# Patient Record
Sex: Male | Born: 1955 | Race: White | Hispanic: No | Marital: Married | Smoking: Former smoker
Health system: Southern US, Community
[De-identification: ages and names within clinical notes are randomized; demographics above are authoritative.]

## PROBLEM LIST (undated history)

## (undated) DIAGNOSIS — K760 Fatty (change of) liver, not elsewhere classified: Secondary | ICD-10-CM

## (undated) DIAGNOSIS — K219 Gastro-esophageal reflux disease without esophagitis: Secondary | ICD-10-CM

## (undated) DIAGNOSIS — E119 Type 2 diabetes mellitus without complications: Secondary | ICD-10-CM

## (undated) DIAGNOSIS — N189 Chronic kidney disease, unspecified: Secondary | ICD-10-CM

## (undated) DIAGNOSIS — H269 Unspecified cataract: Secondary | ICD-10-CM

## (undated) DIAGNOSIS — T7840XA Allergy, unspecified, initial encounter: Secondary | ICD-10-CM

## (undated) DIAGNOSIS — N529 Male erectile dysfunction, unspecified: Secondary | ICD-10-CM

## (undated) DIAGNOSIS — E785 Hyperlipidemia, unspecified: Secondary | ICD-10-CM

## (undated) DIAGNOSIS — K449 Diaphragmatic hernia without obstruction or gangrene: Secondary | ICD-10-CM

## (undated) DIAGNOSIS — I10 Essential (primary) hypertension: Secondary | ICD-10-CM

## (undated) DIAGNOSIS — M109 Gout, unspecified: Secondary | ICD-10-CM

## (undated) DIAGNOSIS — T884XXA Failed or difficult intubation, initial encounter: Secondary | ICD-10-CM

## (undated) DIAGNOSIS — Z87442 Personal history of urinary calculi: Secondary | ICD-10-CM

## (undated) DIAGNOSIS — C801 Malignant (primary) neoplasm, unspecified: Secondary | ICD-10-CM

## (undated) DIAGNOSIS — M5430 Sciatica, unspecified side: Secondary | ICD-10-CM

## (undated) DIAGNOSIS — M1990 Unspecified osteoarthritis, unspecified site: Secondary | ICD-10-CM

## (undated) DIAGNOSIS — E669 Obesity, unspecified: Secondary | ICD-10-CM

## (undated) HISTORY — DX: Hemochromatosis, unspecified: E83.119

## (undated) HISTORY — DX: Male erectile dysfunction, unspecified: N52.9

## (undated) HISTORY — DX: Unspecified cataract: H26.9

## (undated) HISTORY — DX: Diaphragmatic hernia without obstruction or gangrene: K44.9

## (undated) HISTORY — PX: TONSILLECTOMY: SUR1361

## (undated) HISTORY — DX: Obesity, unspecified: E66.9

## (undated) HISTORY — PX: APPENDECTOMY: SHX54

## (undated) HISTORY — DX: Other disorders of iron metabolism: E83.19

## (undated) HISTORY — DX: Gout, unspecified: M10.9

## (undated) HISTORY — DX: Type 2 diabetes mellitus without complications: E11.9

## (undated) HISTORY — DX: Hyperlipidemia, unspecified: E78.5

## (undated) HISTORY — DX: Malignant (primary) neoplasm, unspecified: C80.1

## (undated) HISTORY — DX: Chronic kidney disease, unspecified: N18.9

## (undated) HISTORY — DX: Essential (primary) hypertension: I10

## (undated) HISTORY — PX: CERVICAL FUSION: SHX112

## (undated) HISTORY — DX: Allergy, unspecified, initial encounter: T78.40XA

## (undated) HISTORY — DX: Gastro-esophageal reflux disease without esophagitis: K21.9

## (undated) HISTORY — DX: Unspecified osteoarthritis, unspecified site: M19.90

## (undated) HISTORY — PX: CARDIAC CATHETERIZATION: SHX172

## (undated) SURGERY — COLONOSCOPY WITH PROPOFOL
Anesthesia: Monitor Anesthesia Care

---

## 2002-08-21 ENCOUNTER — Emergency Department (HOSPITAL_COMMUNITY): Admission: EM | Admit: 2002-08-21 | Discharge: 2002-08-21 | Payer: Self-pay | Admitting: *Deleted

## 2002-08-21 ENCOUNTER — Encounter: Payer: Self-pay | Admitting: Emergency Medicine

## 2002-09-16 ENCOUNTER — Ambulatory Visit (HOSPITAL_COMMUNITY): Admission: RE | Admit: 2002-09-16 | Discharge: 2002-09-16 | Payer: Self-pay | Admitting: Cardiology

## 2003-01-31 ENCOUNTER — Encounter (INDEPENDENT_AMBULATORY_CARE_PROVIDER_SITE_OTHER): Payer: Self-pay | Admitting: Specialist

## 2003-01-31 ENCOUNTER — Ambulatory Visit (HOSPITAL_COMMUNITY): Admission: RE | Admit: 2003-01-31 | Discharge: 2003-01-31 | Payer: Self-pay | Admitting: *Deleted

## 2003-01-31 ENCOUNTER — Encounter (INDEPENDENT_AMBULATORY_CARE_PROVIDER_SITE_OTHER): Payer: Self-pay | Admitting: *Deleted

## 2003-04-01 ENCOUNTER — Ambulatory Visit (HOSPITAL_COMMUNITY): Admission: RE | Admit: 2003-04-01 | Discharge: 2003-04-01 | Payer: Self-pay | Admitting: *Deleted

## 2004-03-12 ENCOUNTER — Ambulatory Visit: Payer: Self-pay | Admitting: Internal Medicine

## 2004-08-30 ENCOUNTER — Ambulatory Visit: Payer: Self-pay | Admitting: Internal Medicine

## 2005-01-17 ENCOUNTER — Ambulatory Visit: Payer: Self-pay | Admitting: Internal Medicine

## 2005-02-01 ENCOUNTER — Ambulatory Visit: Payer: Self-pay | Admitting: Internal Medicine

## 2005-03-22 ENCOUNTER — Ambulatory Visit: Payer: Self-pay | Admitting: Internal Medicine

## 2005-03-29 ENCOUNTER — Ambulatory Visit: Payer: Self-pay | Admitting: Internal Medicine

## 2006-06-12 ENCOUNTER — Encounter: Payer: Self-pay | Admitting: Family Medicine

## 2006-06-12 ENCOUNTER — Ambulatory Visit: Payer: Self-pay | Admitting: Family Medicine

## 2006-06-12 DIAGNOSIS — G5600 Carpal tunnel syndrome, unspecified upper limb: Secondary | ICD-10-CM | POA: Insufficient documentation

## 2006-06-12 DIAGNOSIS — K439 Ventral hernia without obstruction or gangrene: Secondary | ICD-10-CM | POA: Insufficient documentation

## 2006-06-12 DIAGNOSIS — I10 Essential (primary) hypertension: Secondary | ICD-10-CM | POA: Insufficient documentation

## 2006-06-12 DIAGNOSIS — N529 Male erectile dysfunction, unspecified: Secondary | ICD-10-CM | POA: Insufficient documentation

## 2006-06-12 DIAGNOSIS — K279 Peptic ulcer, site unspecified, unspecified as acute or chronic, without hemorrhage or perforation: Secondary | ICD-10-CM | POA: Insufficient documentation

## 2006-06-12 DIAGNOSIS — E7800 Pure hypercholesterolemia, unspecified: Secondary | ICD-10-CM | POA: Insufficient documentation

## 2006-06-18 ENCOUNTER — Ambulatory Visit: Payer: Self-pay | Admitting: Family Medicine

## 2006-06-18 LAB — CONVERTED CEMR LAB
ALT: 82 units/L — ABNORMAL HIGH (ref 0–40)
AST: 47 units/L — ABNORMAL HIGH (ref 0–37)
Albumin: 4.2 g/dL (ref 3.5–5.2)
Alkaline Phosphatase: 54 units/L (ref 39–117)
BUN: 13 mg/dL (ref 6–23)
Bilirubin, Direct: 0.1 mg/dL (ref 0.0–0.3)
Creatinine, Ser: 0.9 mg/dL (ref 0.4–1.5)
GFR calc Af Amer: 115 mL/min
GFR calc non Af Amer: 95 mL/min
HDL: 44.3 mg/dL (ref >39.0)
Sodium: 143 meq/L (ref 135–145)
Uric Acid, Serum: 8.1 mg/dL — ABNORMAL HIGH (ref 2.4–7.0)

## 2006-06-24 ENCOUNTER — Ambulatory Visit: Payer: Self-pay | Admitting: Family Medicine

## 2006-07-03 ENCOUNTER — Telehealth: Payer: Self-pay | Admitting: Family Medicine

## 2006-07-04 ENCOUNTER — Telehealth (INDEPENDENT_AMBULATORY_CARE_PROVIDER_SITE_OTHER): Payer: Self-pay | Admitting: *Deleted

## 2006-07-04 ENCOUNTER — Ambulatory Visit: Payer: Self-pay | Admitting: Family Medicine

## 2006-07-04 DIAGNOSIS — R740 Nonspecific elevation of levels of transaminase and lactic acid dehydrogenase [LDH]: Secondary | ICD-10-CM

## 2006-07-04 DIAGNOSIS — R7401 Elevation of levels of liver transaminase levels: Secondary | ICD-10-CM | POA: Insufficient documentation

## 2006-07-07 LAB — CONVERTED CEMR LAB
Albumin: 4.9 g/dL (ref 3.5–5.2)
Alkaline Phosphatase: 71 units/L (ref 39–117)
Bilirubin, Direct: 0.1 mg/dL (ref 0.0–0.3)
HCV Ab: NEGATIVE
Hep B C IgM: NEGATIVE
Hep B Core Total Ab: NEGATIVE
Indirect Bilirubin: 0.5 mg/dL (ref 0.0–0.9)
Saturation Ratios: 32 % (ref 20–55)
Total Bilirubin: 0.6 mg/dL (ref 0.3–1.2)

## 2006-07-08 ENCOUNTER — Ambulatory Visit: Payer: Self-pay | Admitting: Family Medicine

## 2006-07-08 DIAGNOSIS — M109 Gout, unspecified: Secondary | ICD-10-CM | POA: Insufficient documentation

## 2006-07-08 DIAGNOSIS — K449 Diaphragmatic hernia without obstruction or gangrene: Secondary | ICD-10-CM | POA: Insufficient documentation

## 2006-07-08 DIAGNOSIS — M25559 Pain in unspecified hip: Secondary | ICD-10-CM | POA: Insufficient documentation

## 2006-07-14 ENCOUNTER — Encounter: Admission: RE | Admit: 2006-07-14 | Discharge: 2006-07-14 | Payer: Self-pay | Admitting: Family Medicine

## 2006-07-14 ENCOUNTER — Telehealth: Payer: Self-pay | Admitting: Family Medicine

## 2006-08-04 ENCOUNTER — Ambulatory Visit: Payer: Self-pay | Admitting: Gastroenterology

## 2006-08-04 LAB — CONVERTED CEMR LAB
Anti Nuclear Antibody(ANA): NEGATIVE
Basophils Absolute: 0.1 10*3/uL (ref 0.0–0.1)
Eosinophils Absolute: 0.2 10*3/uL (ref 0.0–0.6)
Eosinophils Relative: 3.7 % (ref 0.0–5.0)
MCV: 89.3 fL (ref 78.0–100.0)
Platelets: 191 10*3/uL (ref 150–400)
RBC: 4.83 M/uL (ref 4.22–5.81)
Saturation Ratios: 44.2 % (ref 20.0–50.0)
TSH: 1.74 microintl units/mL (ref 0.35–5.50)
Transferrin: 265 mg/dL (ref >=212.0)
WBC: 6 10*3/uL (ref 4.5–10.5)

## 2006-08-18 ENCOUNTER — Ambulatory Visit: Payer: Self-pay | Admitting: Family Medicine

## 2006-08-18 ENCOUNTER — Ambulatory Visit: Payer: Self-pay | Admitting: Gastroenterology

## 2006-08-18 LAB — CONVERTED CEMR LAB: Ferritin: 443.9 ng/mL — ABNORMAL HIGH (ref 22.0–322.0)

## 2006-08-22 LAB — CONVERTED CEMR LAB: Uric Acid, Serum: 7.9 mg/dL — ABNORMAL HIGH (ref 2.4–7.0)

## 2006-08-25 ENCOUNTER — Ambulatory Visit: Payer: Self-pay | Admitting: Gastroenterology

## 2007-07-01 ENCOUNTER — Telehealth: Payer: Self-pay | Admitting: Gastroenterology

## 2007-07-06 ENCOUNTER — Ambulatory Visit: Payer: Self-pay | Admitting: Gastroenterology

## 2007-07-06 LAB — CONVERTED CEMR LAB
Bilirubin, Direct: 0.1 mg/dL (ref 0.0–0.3)
TSH: 1.34 microintl units/mL (ref 0.35–5.50)
Total Bilirubin: 0.8 mg/dL (ref 0.3–1.2)

## 2007-07-08 ENCOUNTER — Telehealth: Payer: Self-pay | Admitting: Gastroenterology

## 2007-08-08 ENCOUNTER — Encounter (INDEPENDENT_AMBULATORY_CARE_PROVIDER_SITE_OTHER): Payer: Self-pay | Admitting: *Deleted

## 2007-08-11 ENCOUNTER — Ambulatory Visit: Payer: Self-pay | Admitting: Gastroenterology

## 2007-10-01 ENCOUNTER — Ambulatory Visit: Payer: Self-pay | Admitting: Family Medicine

## 2007-10-13 ENCOUNTER — Telehealth: Payer: Self-pay | Admitting: Family Medicine

## 2007-10-14 ENCOUNTER — Ambulatory Visit: Payer: Self-pay | Admitting: Family Medicine

## 2007-10-23 ENCOUNTER — Ambulatory Visit: Payer: Self-pay | Admitting: Family Medicine

## 2008-03-06 ENCOUNTER — Telehealth: Payer: Self-pay | Admitting: Internal Medicine

## 2008-03-07 ENCOUNTER — Ambulatory Visit: Payer: Self-pay | Admitting: Family Medicine

## 2008-09-23 ENCOUNTER — Ambulatory Visit: Payer: Self-pay | Admitting: Gastroenterology

## 2008-10-07 ENCOUNTER — Ambulatory Visit: Payer: Self-pay | Admitting: Gastroenterology

## 2008-10-07 LAB — CONVERTED CEMR LAB
Albumin: 4.5 g/dL (ref 3.5–5.2)
BUN: 14 mg/dL (ref 6–23)
Cholesterol: 249 mg/dL (ref 0–200)
Creatinine, Ser: 1.1 mg/dL (ref 0.4–1.5)
Direct LDL: 186.4 mg/dL
GFR calc Af Amer: 91 mL/min
GFR calc non Af Amer: 75 mL/min
HDL: 43.2 mg/dL (ref >39.0)
Total Protein: 7 g/dL (ref 6.0–8.3)
Triglycerides: 119 mg/dL (ref 0–149)
VLDL: 24 mg/dL (ref 0–40)

## 2008-10-10 ENCOUNTER — Telehealth: Payer: Self-pay | Admitting: Gastroenterology

## 2008-10-10 LAB — CONVERTED CEMR LAB
Iron: 178 ug/dL — ABNORMAL HIGH (ref 42–165)
Transferrin: 257.2 mg/dL (ref 212.0–360.0)

## 2008-10-11 ENCOUNTER — Telehealth: Payer: Self-pay | Admitting: Gastroenterology

## 2008-10-11 ENCOUNTER — Telehealth: Payer: Self-pay | Admitting: Family Medicine

## 2008-10-12 ENCOUNTER — Ambulatory Visit: Payer: Self-pay | Admitting: Gastroenterology

## 2008-10-12 ENCOUNTER — Ambulatory Visit: Payer: Self-pay | Admitting: Family Medicine

## 2008-10-18 LAB — CONVERTED CEMR LAB
ALT: 33 units/L (ref 0–53)
AST: 27 units/L (ref 0–37)
HDL: 43.5 mg/dL (ref >39.00)
Hemoglobin: 15.1 g/dL (ref 13.0–17.0)
Triglycerides: 164 mg/dL — ABNORMAL HIGH (ref 0.0–149.0)
VLDL: 32.8 mg/dL (ref 0.0–40.0)

## 2008-11-08 ENCOUNTER — Telehealth: Payer: Self-pay | Admitting: Family Medicine

## 2008-11-16 ENCOUNTER — Telehealth: Payer: Self-pay | Admitting: Gastroenterology

## 2008-12-06 ENCOUNTER — Ambulatory Visit: Payer: Self-pay | Admitting: Gastroenterology

## 2008-12-06 DIAGNOSIS — E669 Obesity, unspecified: Secondary | ICD-10-CM | POA: Insufficient documentation

## 2008-12-06 DIAGNOSIS — R1011 Right upper quadrant pain: Secondary | ICD-10-CM | POA: Insufficient documentation

## 2008-12-06 DIAGNOSIS — K7689 Other specified diseases of liver: Secondary | ICD-10-CM | POA: Insufficient documentation

## 2008-12-06 LAB — CONVERTED CEMR LAB: Hemoglobin: 13.8 g/dL (ref 13.0–17.0)

## 2008-12-09 ENCOUNTER — Ambulatory Visit (HOSPITAL_COMMUNITY): Admission: RE | Admit: 2008-12-09 | Discharge: 2008-12-09 | Payer: Self-pay | Admitting: Gastroenterology

## 2008-12-09 ENCOUNTER — Ambulatory Visit: Payer: Self-pay | Admitting: Gastroenterology

## 2008-12-09 DIAGNOSIS — K2970 Gastritis, unspecified, without bleeding: Secondary | ICD-10-CM | POA: Insufficient documentation

## 2008-12-09 DIAGNOSIS — K2990 Gastroduodenitis, unspecified, without bleeding: Secondary | ICD-10-CM

## 2009-01-10 ENCOUNTER — Telehealth: Payer: Self-pay | Admitting: Family Medicine

## 2009-01-27 ENCOUNTER — Ambulatory Visit: Payer: Self-pay | Admitting: Gastroenterology

## 2009-01-27 LAB — CONVERTED CEMR LAB
HCT: 41.4 % (ref 39.0–52.0)
Hemoglobin: 14.3 g/dL (ref 13.0–17.0)

## 2009-07-18 ENCOUNTER — Telehealth: Payer: Self-pay | Admitting: Gastroenterology

## 2009-07-26 ENCOUNTER — Ambulatory Visit: Payer: Self-pay | Admitting: Gastroenterology

## 2009-07-27 ENCOUNTER — Telehealth: Payer: Self-pay | Admitting: Gastroenterology

## 2009-07-27 LAB — CONVERTED CEMR LAB
Alkaline Phosphatase: 55 units/L (ref 39–117)
Basophils Absolute: 0.1 10*3/uL (ref 0.0–0.1)
Bilirubin, Direct: 0.1 mg/dL (ref 0.0–0.3)
Ferritin: 64.3 ng/mL (ref 22.0–322.0)
Hemoglobin: 14.8 g/dL (ref 13.0–17.0)
Lymphocytes Relative: 26.4 % (ref 12.0–46.0)
Monocytes Relative: 8.7 % (ref 3.0–12.0)
Neutro Abs: 5 10*3/uL (ref 1.4–7.7)
Platelets: 210 10*3/uL (ref 150.0–400.0)
RDW: 13.9 % (ref 11.5–14.6)
WBC: 8.2 10*3/uL (ref 4.5–10.5)

## 2009-08-22 ENCOUNTER — Encounter: Admission: RE | Admit: 2009-08-22 | Discharge: 2009-08-22 | Payer: Self-pay | Admitting: Gastroenterology

## 2009-10-12 ENCOUNTER — Emergency Department (HOSPITAL_COMMUNITY): Admission: EM | Admit: 2009-10-12 | Discharge: 2009-10-12 | Payer: Self-pay | Admitting: Emergency Medicine

## 2009-10-16 ENCOUNTER — Encounter: Admission: RE | Admit: 2009-10-16 | Discharge: 2009-10-16 | Payer: Self-pay | Admitting: Occupational Medicine

## 2009-12-25 ENCOUNTER — Telehealth: Payer: Self-pay | Admitting: Family Medicine

## 2009-12-29 ENCOUNTER — Encounter (INDEPENDENT_AMBULATORY_CARE_PROVIDER_SITE_OTHER): Payer: Self-pay | Admitting: *Deleted

## 2010-01-22 ENCOUNTER — Ambulatory Visit: Payer: Self-pay | Admitting: Gastroenterology

## 2010-01-23 ENCOUNTER — Telehealth (INDEPENDENT_AMBULATORY_CARE_PROVIDER_SITE_OTHER): Payer: Self-pay | Admitting: *Deleted

## 2010-01-29 ENCOUNTER — Encounter: Payer: Self-pay | Admitting: Family Medicine

## 2010-01-29 ENCOUNTER — Ambulatory Visit: Payer: Self-pay | Admitting: Family Medicine

## 2010-01-29 DIAGNOSIS — M109 Gout, unspecified: Secondary | ICD-10-CM | POA: Insufficient documentation

## 2010-01-30 ENCOUNTER — Ambulatory Visit: Payer: Self-pay | Admitting: Family Medicine

## 2010-01-30 ENCOUNTER — Encounter (INDEPENDENT_AMBULATORY_CARE_PROVIDER_SITE_OTHER): Payer: Self-pay | Admitting: *Deleted

## 2010-01-30 ENCOUNTER — Telehealth (INDEPENDENT_AMBULATORY_CARE_PROVIDER_SITE_OTHER): Payer: Self-pay | Admitting: *Deleted

## 2010-01-30 LAB — CONVERTED CEMR LAB: HDL goal, serum: 40 mg/dL

## 2010-02-08 ENCOUNTER — Ambulatory Visit: Payer: Self-pay | Admitting: Family Medicine

## 2010-02-14 ENCOUNTER — Telehealth (INDEPENDENT_AMBULATORY_CARE_PROVIDER_SITE_OTHER): Payer: Self-pay | Admitting: *Deleted

## 2010-04-03 NOTE — Letter (Signed)
Summary: Appointment Reminder  [facility]  [address] [place], [place] [address]   Phone: [phone]  Fax: [phone]        January 30, 2010 MRN: [id number]    [facility] [address] [place], [place]  [address]    Dear Mr. Fineberg,   We have been unable to reach you by phone to schedule a follow up   appointment that was recommended for you by Dr. Jarold Motto. It is very   important that we reach you to schedule the lab appointment. We hope   that you allow [place] to participate in your health care needs. Please   contact us at  [phone] at your earliest convenience to schedule your appointment.     Sincerely,    Harlow Mares CMA (AAMA)

## 2010-04-03 NOTE — Letter (Signed)
Summary: Generic Letter  [facility]  [address] [place], [place] [address]   Phone: [phone]  Fax: [phone]    12/29/2009     [facility] [address] [place], [place]  [address]    Dear Mr. Yohannes,   We have tried to contact you because you need a Physical appt. I am going to refill medication for 2 months but, if no physical appt by then there will be no furthur refills.        Sincerely,  Kerby Nora MD

## 2010-04-03 NOTE — Progress Notes (Signed)
Summary: wants lab work  Phone Note Call from Patient Call back at [employer] [phone]   Caller: Spouse Summary of Call: Pt is coming in on 11/29 for a physical.  He wants lab work prior.  Wants B12, PSA, ferritin, lipids, vit D. Initial call taken by: Lowella Petties CMA, AAMA,  January 23, 2010 8:05 AM  Follow-up for Phone Call         B12, PSA, ferritin, lipids, vit D, CMET , uric acid  Dx 272.0, 780.79, v76.44, 274.0, 275.0 Follow-up by: Kerby Nora MD,  January 24, 2010 12:25 AM  Additional Follow-up for Phone Call Additional follow up Details #1::        lmom for pt to call and schedule lab appt. Additional Follow-up by: Mills Koller,  January 24, 2010 10:20 AM    Additional Follow-up for Phone Call Additional follow up Details #2::    spoke with wife and scheduled appt, she also wanted to know if we could check his homocystine level, and a MMA. Natasha Chavers CMA Duncan Dull)  January 24, 2010 12:54 PM   Additional Follow-up for Phone Call Additional follow up Details #3:: Details for Additional Follow-up Action Taken: Yes we can add these  Dx 268.9 Additional Follow-up by: Kerby Nora MD,  January 25, 2010 10:36 PM

## 2010-04-03 NOTE — Progress Notes (Signed)
Summary: Schedule Phlenotomy   ---- Converted from flag ---- ---- 01/24/2010 8:28 AM, Harlow Mares CMA (AAMA) wrote: Left a message on patients machine to call back.   ---- 01/15/2010 11:54 AM, Harlow Mares CMA (AAMA) wrote: called and let pt know on Vm that he is due for his phlebotomy...check to see if he had it done.   ---- 11/01/2009 4:34 PM, Harlow Mares CMA (AAMA) wrote: left meaage on machine to call back   ---- 07/27/2009 11:52 AM, Harlow Mares CMA (AAMA) wrote: pt due for phlebotomy, please call and put order in IDX ------------------------------  LETTER MAILED

## 2010-04-03 NOTE — Progress Notes (Signed)
Summary: benicar  Phone Note Refill Request Message from:  [employer] on December 25, 2009 7:42 AM  Refills Requested: Medication #1:  BENICAR HCT 40-25 MG TABS Take 1 tablet by mouth once a day   Supply Requested: 3 months Patient not seen in office in over a year  [facility] [place]   Method Requested: Electronic Initial call taken by: Benny Lennert CMA Duncan Dull),  December 25, 2009 7:42 AM  Follow-up for Phone Call        Needs appt  for CPX before refill..may refill until appt day.  Follow-up by: Kerby Nora MD,  December 26, 2009 8:16 AM  Additional Follow-up for Phone Call Additional follow up Details #1::        left 2 messages for paitent to return my call.Consuello Masse CMA     Additional Follow-up by: Benny Lennert CMA Duncan Dull),  December 27, 2009 3:21 PM    Additional Follow-up for Phone Call Additional follow up Details #2::    Left message at [phone] for patient to call back. No answer or v/m at home phone.Lewanda Rife LPN  December 28, 2009 4:33 PM   UNABLE TO CONTACT PATIENT SO LETTER MAILED.Consuello Masse CMA  December 29, 2009 3:22 PM  Follow-up by: Benny Lennert CMA Duncan Dull),  December 29, 2009 3:22 PM

## 2010-04-03 NOTE — Progress Notes (Signed)
Summary: labwork   Phone Note Call from Patient Call back at Home Phone [phone]   Caller: wife, Eber Jones Call For: Dr. Jarold Motto Reason for Call: Talk to Nurse Summary of Call: would like to sch labwork for a check-up Initial call taken by: Vallarie Mare,  Jul 18, 2009 11:04 AM  Follow-up for Phone Call        What lab is needed? Follow-up by: Ashok Cordia RN,  Jul 18, 2009 11:46 AM  Additional Follow-up for Phone Call Additional follow up Details #1::        cbc and ferritin Additional Follow-up by: Mardella Layman MD Clementeen Graham,  Jul 18, 2009 12:03 PM    Additional Follow-up for Phone Call Additional follow up Details #2::    Wife asks if liver needs to be checked?   Will come by 07/26/09. Follow-up by: Ashok Cordia RN,  Jul 18, 2009 12:23 PM  Additional Follow-up for Phone Call Additional follow up Details #3:: Details for Additional Follow-up Action Taken: yes Additional Follow-up by: Mardella Layman MD [facility],  Jul 18, 2009 1:38 PM

## 2010-04-03 NOTE — Progress Notes (Signed)
Summary: follow up from labs and Abdominal pain   Phone Note Call from Patient Call back at Home Phone [phone]   Caller: Spouse Summary of Call: patients wife called back to find out what the patients ferritin was at his last labs and to let Dr. Jarold Motto know that the patient is still having RUQ pain that is more constant and a little more severe than previous than at his last office visit. She would like Jah to have a MRI to check and see what is going on if possible she would prefer MRI to a CT due to the amount of radiation from a CT.  Initial call taken by: Harlow Mares CMA Duncan Dull),  Jul 27, 2009 1:48 PM  Follow-up for Phone Call        MRI PROBABLY INDICATED HERE. Follow-up by: Mardella Layman MD Clementeen Graham,  Jul 28, 2009 8:30 AM  Additional Follow-up for Phone Call Additional follow up Details #1::        Lm for pt to call.  Ashok Cordia RN  August 02, 2009 11:45 AM   Talked with wife.  Req MRi to be done at [facility].  Ptis off of work on June 21 if possible. Additional Follow-up by: Ashok Cordia RN,  August 03, 2009 2:48 PM    Additional Follow-up for Phone Call Additional follow up Details #2::    Wife notified of appt.  Pt needs to be NPO hrs prior to proc.   Follow-up by: Ashok Cordia RN,  August 03, 2009 2:55 PM

## 2010-04-05 NOTE — Progress Notes (Signed)
Summary: exposed to pin worms  Phone Note Call from Patient Call back at Home Phone [phone]   Caller: Spouse Complaint: Earache/Ear Infection Summary of Call: Pt's granddaughter has been sleeping with pt and his wife and they have found out that granddaughter has pin worms.  [facility] [place]. Initial call taken by: Lowella Petties CMA, AAMA,  February 14, 2010 4:48 PM  Follow-up for Phone Call        albendazole 200 mg, 2 tabs by mouth now. Repeat a second dose of 2 tabs 2 weeks from initial dose. #4.  Vermox no longer made.  Entire family should be treated. Spencer Copland MD  February 14, 2010 6:00 PM  Follow-up by: Hannah Beat MD,  February 14, 2010 6:00 PM    New/Updated Medications: ALBENZA 200 MG TABS (ALBENDAZOLE) take 2 tablets today and then 2 tablets in 2 weeks Prescriptions: ALBENZA 200 MG TABS (ALBENDAZOLE) take 2 tablets today and then 2 tablets in 2 weeks  #4 x 0   Entered by:   Benny Lennert CMA (AAMA)   Authorized by:   Hannah Beat MD   Signed by:   Benny Lennert CMA (AAMA) on 02/15/2010   Method used:   Electronically to        [facility] [phone][facility] (retail)       [address]       [place], [place]  [address]       Ph: [phone] or [phone]       Fax: [phone]   RxID:   [id number]   Prior Medications: BENICAR HCT 40-25 MG TABS (OLMESARTAN MEDOXOMIL-HCTZ) Take 1 tablet by mouth once a day CIALIS 20 MG TABS (TADALAFIL) 1/4 -1 tab by mouth as needed prior to sexual activity. Current Allergies: ! PENICILLIN V POTASSIUM (PENICILLIN V POTASSIUM) ! KEFLEX

## 2010-04-05 NOTE — Assessment & Plan Note (Signed)
Summary: CPX   Vital Signs:  Patient profile:   55 year old male Height:      66.25 inches Weight:      227.8 pounds BMI:     36.62 Temp:     98.8 degrees F oral Pulse rate:   68 / minute Pulse rhythm:   regular BP sitting:   120 / 78  (left arm) Cuff size:   large  Vitals Entered By: Benny Lennert CMA Duncan Dull) (January 30, 2010 12:04 PM)  History of Present Illness: Chief complaint cpx  The patient is here for annual wellness exam and preventative care.    GERd, well controlled, not requiring  pantoprazole, ran out several months ago. Marland Kitchen  Has had URI symptoms,x 1-2 week.. congestion improving but cough not resolving...feels phelgm in back of throat. Keep shim up at night. NO SOB, no wheeze. No fever. Using robitussin DM.  Out of work in last 4 months Bulging disc in neck...on prednisone..Dr. Alto Denver. [facility].  Currently getting PT..has follow up on 12/9.  Since took prednisone.. noted breast soreness.. has gained 10 lbs on prednisone.  Gradually improving breast soreness.   ED.Marland Kitchennoted worsening in last 4 month... No change in desire.. trouble maintaining erection.   Hypertension History:      He denies headache, chest pain, palpitations, dyspnea with exertion, peripheral edema, neurologic problems, and syncope.  Some Bps at home 122/64.        Positive major cardiovascular risk factors include male age 80 years old or older, hyperlipidemia, and hypertension.  Negative major cardiovascular risk factors include non-tobacco-user status.    Lipid Management History:      Positive NCEP/ATP III risk factors include male age 21 years old or older and hypertension.  Negative NCEP/ATP III risk factors include non-tobacco-user status.     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 Take 1 Tablet By Mouth Once A Day  Allergies: 1)  ! Penicillin V Potassium (Penicillin V Potassium) 2)  ! Keflex  Past History:  Past medical, surgical, family and social histories (including risk factors) reviewed, and no changes noted (except as noted below).  Past Medical History: Reviewed history from 06/12/2006 and no changes required. Hyperlipidemia Hypertension Peptic ulcer disease from aspirin, ibuprofen  Past Surgical History: Reviewed history from 08/11/2007 and no changes required. EGD: 2000  PUD Appendectomy (1610) Tonsillectomy (1979) cardiac cath neg  ~2004 he had colonoscopy by Dr. Virginia Rochester in 2005.  Family History: Reviewed history from 08/11/2007 and no changes required. father dead age 12 lung cancer mother dead age 73 melanoma 2 brother: DM, HTN, kidney stones, lone deceased age 42L eukemia no  Mi less than age 26  Social History: Reviewed history from 06/12/2006 and no changes required. Occupation: [employer] driver Married x 30 Former Smoker: 2-3 pack year history remote Drug use-no Alcohol use-yes 1-2 beers every 2 weeks Regular exercise-noDiet: buiscut for breakfast, some fruit and veggies   Review of Systems General:  Denies fatigue and fever. CV:  Denies chest  pain or discomfort. Resp:  Denies shortness of breath. GI:  Denies abdominal pain. GU:  Denies dysuria.  Physical Exam  General:  Overweight  no acute distress.healthy appearing.  I cannot appreciate stigmata of chronic liver disease. Eyes:  No corneal or conjunctival inflammation noted. EOMI. Perrla. Funduscopic exam benign, without hemorrhages, exudates or papilledema. Vision grossly normal. Ears:  External ear exam shows no significant lesions or deformities.  Otoscopic examination reveals clear canals, tympanic membranes are intact bilaterally without bulging, retraction, inflammation or discharge. Hearing is grossly normal bilaterally. Nose:  External nasal examination shows no deformity or inflammation. Nasal mucosa are pink and moist without lesions or exudates. Mouth:  Oral mucosa and oropharynx without lesions or exudates.  Teeth in good repair. Neck:  no carotid bruit or thyromegaly no cervical or supraclavicular lymphadenopathy  Lungs:  Normal respiratory effort, chest expands symmetrically. Lungs are clear to auscultation, no crackles or wheezes. Heart:  Normal rate and regular rhythm. S1 and S2 normal without gallop, murmur, click, rub or other extra sounds. Abdomen:  He is obese and has hepatomegaly in the right upper quadrant with a somewhat lobular and firm and somewhat tender liver 5-6 cm below the right subcostal margin. Cannot appreciate splenomegaly, ascites, other abdominal masses or tenderness. Genitalia:  NO hypogonadism, no masses Prostate:  Prostate gland firm and smooth, no enlargement, nodularity, tenderness, mass, asymmetry or induration. Pulses:  R and L posterior tibial pulses are full and equal bilaterally  Extremities:  No clubbing, cyanosis, edema or deformities noted. Skin:  Intact without suspicious lesions or rashes Psych:  Alert and cooperative. Normal mood and affect.   Impression & Recommendations:  Problem # 1:  Preventive Health Care (ICD-V70.0) The  patient's preventative maintenance and recommended screening tests for an annual wellness exam were reviewed in full today. Brought up to date unless services declined.  Counselled on the importance of diet, exercise, and its role in overall health and mortality. The patient's FH and SH was reviewed, including their home life, tobacco status, and drug and alcohol status.   PSA not back yet.. Sent to outside lab.     Problem # 2:  HYPERTENSION (ICD-401.9) Assessment: Unchanged Well controlled.Call if Blood pressure continues to lower... less than 110s/60s...to consider decreasing benicar. His updated medication list for this problem includes:    Benicar Hct 40-25 Mg Tabs (Olmesartan medoxomil-hctz) .Marland Kitchen... Take 1 tablet by mouth once a day  BP today: 120/78 Prior BP: 110/78 (12/06/2008)  Prior 10 Yr Risk Heart Disease: Not enough information (06/12/2006)  Labs Reviewed: K+: 3.4 (10/14/2007) Creat: : 1.1 (10/14/2007)   Chol: 222 (10/12/2008)   HDL: 43.50 (10/12/2008)   LDL: DEL (10/14/2007)   TG: 164.0 (10/12/2008)  Problem # 3:  HYPERLIPIDEMIA (ICD-272.4) LAb results not back yet... sent to outside lab.  Labs Reviewed: SGOT: 50 (07/26/2009)   SGPT: 77 (07/26/2009)  Prior 10 Yr Risk Heart Disease: Not enough information (06/12/2006)   HDL:43.50 (10/12/2008), 43.2 (10/14/2007)  LDL:DEL (10/14/2007), DEL (06/18/2006)  Chol:222 (10/12/2008), 249 (10/14/2007)  Trig:164.0 (10/12/2008), 119 (10/14/2007)  Problem # 4:  HIATAL HERNIA WITH REFLUX (ICD-553.3) Assessment: Improved No longer  requiring PPI. Asymptomatic off this med.   Problem # 5:  ERECTILE DYSFUNCTION, ORGANIC (ICD-607.84) Cialis trial. No decrease in desire... if not improving with cialis.. can add testosterone to labs.  His updated medication list for this problem includes:    Cialis 20 Mg Tabs (Tadalafil) .Marland Kitchen... 1/4 -1 tab by mouth as needed prior to sexual activity.  Problem # 6:  URI (ICD-465.9)  The following  medications were removed from the medication list:    Zyrtec Allergy 10 Mg Tabs (Cetirizine hcl) .Marland Kitchen... Take 1 tablet by mouth once a day His updated medication list for this problem includes:    Cheratussin Ac 100-10 Mg/6ml Syrp (Guaifenesin-codeine) .Marland Kitchen... 1 tsp by mouth at bedtime as needed cough  Instructed on symptomatic treatment. Call if symptoms persist or worsen.   Problem # 7:  IRON OVERLOAD (ICD-275.0) Ferritin and LFT levels pending.   Complete Medication List: 1)  Benicar Hct 40-25 Mg Tabs (Olmesartan medoxomil-hctz) .... Take 1 tablet by mouth once a day 2)  Cialis 20 Mg Tabs (Tadalafil) .... 1/4 -1 tab by mouth as needed prior to sexual activity. 3)  Cheratussin Ac 100-10 Mg/56ml Syrp (Guaifenesin-codeine) .Marland Kitchen.. 1 tsp by mouth at bedtime as needed cough  Hypertension Assessment/Plan:      The patient's hypertensive risk group is category B: At least one risk factor (excluding diabetes) with no target organ damage.  Today's blood pressure is 120/78.  His blood pressure goal is < 140/90.  Lipid Assessment/Plan:      Based on NCEP/ATP III, the patient's risk factor category is "2 or more risk factors and a calculated 10 year CAD risk of > 20%".  The patient's lipid goals are as follows: Total cholesterol goal is 200; LDL cholesterol goal is 130; HDL cholesterol goal is 40; Triglyceride goal is 150.     Patient Instructions: 1)  Call if Blood pressure continues to lower... less than 110s/60s...to consider decreasing benicar. 2)   Guafenesin during the day. 3)   Cough suppressant at night.  4)  We will call you with your lab results when they return.   FPrescriptions: CHERATUSSIN AC 100-10 MG/5ML SYRP (GUAIFENESIN-CODEINE) 1 tsp by mouth at bedtime as needed cough  #8 oz x 0   Entered and Authorized by:   Kerby Nora MD   Signed by:   Kerby Nora MD on 01/30/2010   Method used:   Print then Give to Patient   RxID:   [id number] CHERATUSSIN AC 100-10 MG/5ML SYRP  (GUAIFENESIN-CODEINE) 1 tsp by mouth at bedtime as needed cough  #8 oz x 0   Entered and Authorized by:   Kerby Nora MD   Signed by:   Kerby Nora MD on 01/30/2010   Method used:   Print then Give to Patient   RxID:   [id number] CIALIS 20 MG TABS (TADALAFIL) 1/4 -1 tab by mouth as needed prior to sexual activity.  #9 x 0   Entered and Authorized by:   Kerby Nora MD   Signed by:   Kerby Nora MD on 01/30/2010   Method used:   Electronically to        [facility] (retail)       [address], [address]       [place], [place]  [address]       Ph: [phone]       Fax: [phone]   RxID:   [id number]  Orders Added: 1)  Est. Patient 40-64 years [[address]]    Current Allergies (reviewed today): ! PENICILLIN V POTASSIUM (PENICILLIN V POTASSIUM) ! KEFLEX  Flu Vaccine Next Due:  Refused

## 2010-07-17 NOTE — Assessment & Plan Note (Signed)
[facility]                         GASTROENTEROLOGY OFFICE NOTE   DJ, SENTENO                         MRN:          [id number]  DATE:08/04/2006                            DOB:          05/14/1955    CHIEF COMPLAINT:  Mr. Messmer is a 55 year old, white male referred to  GI for evaluation of abnormal liver function tests, abdominal pain, and  diffuse arthralgias.   HISTORY OF PRESENT ILLNESS:  Mr. Orihuela has had polyarticular arthralgias  for several years and has used a large amount of NSAIDs. He apparently  had a guaiac-positive stool on Hemoccult exam in January 2005 and was  referred to Dr. Sabino Gasser who did a colonoscopy on April 01, 2003.  This was entirely normal. At that time, he apparently also had an  endoscopy and was told that he had ulcers, and apparently was placed  on Protonix. He has had no further GI problems until the last 6-8 weeks  when he has had some dull discomfort in his upper abdomen with gas and  bloating. He has noticed the development of a ventral hernia in his  upper abdomen. He had rather marked indigestion and dyspepsia which has  been alleviated by taking Protonix which he had left over from his  previous problems. He additionally has polyarticular arthralgias and saw  Dr. Ermalene Searing and had blood work done that showed an elevated SGOT of 47,  SGPT of 83 with otherwise normal metabolic profile except for a blood  sugar of 115 mg percent. He was sent for a CT scan of the chest because  of a family history of lung carcinoma and this was unremarkable but did  suggest fatty infiltration of his liver. In addition he was felt to  possibly have gouty arthritis and was placed on colchicine and  allopurinol with good relief of his symptoms although he continues to  complain of some arthralgias. He suffered from psoriasis for many years  but it has not been severe. He apparently has a long history of atypical  chest pain  and in the past has had a negative cardiac catheterization a  few years ago. I do not have these reports or the previous endoscopy  report for review. The patient as mentioned above was placed on  colchicine which caused him rather severe diarrhea and he has  discontinued this. There has been severe cases of arthritis. He has  never really had swollen red hot joints. The patient has no definite  urologic symptoms but concerns he may have kidney stones. His daughter  is a Engineer, civil (consulting) and has raised a lot of these questions in the mind of the  parents.   The patient has had a 25-30 pound weight gain over the last several  years which he relates to decreased ambulation because of his  arthralgias. He describes chronic fatigue but has had no other systemic  symptoms such as fever or chills, etc. He takes no medications for his  psoriasis. He gives no known history of hepatitis or previous prolonged  viral illnesses, clay-colored stools, dark urine, or right upper  quadrant pain of a severe nature. He has not had previous gallbladder  imaging. He denies any specific food intolerances.   PAST MEDICAL HISTORY:  Remarkable for essential hypertension,  hypercholesterolemia with intolerance to statins and previous  appendectomy in 1979.   FAMILY HISTORY:  Remarkable for lung cancer in his father who died at  age 61. Father was a heavy smoker.   MEDICATIONS:  1. Colchicine 0.6 mg p.r.n.  2. Benicar 40-25 mg daily.  3. Allopurinol 100 mg a day.  4. Metamucil daily.   The patient used to use heavy doses of ibuprofen but denies use in the  last several weeks.   He denies drug allergies.   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 He does not smoke but drinks socially. He has never had problems  with alcohol or drug dependency.   REVIEW OF SYSTEMS:  Otherwise noncontributory except for chronic low  back pain for the last several years. He denies any  specific  cardiovascular, pulmonary, genitourinary, neurologic or psychiatric  difficulties.   PHYSICAL EXAMINATION:  GENERAL:  Healthy-appearing, white male in no  distress appearing his stated age.  VITAL SIGNS:  He is 5 feet 5 inches tall, weighs 221 pounds. Blood  pressure is 130/92 and pulse was 76 and regular.  I could not appreciate stigmata of chronic liver disease. There was no  severe psoriasis rashes noted. The joints were not swollen or hot. I  could not appreciate thyromegaly or lymphadenopathy.  CHEST:  Entirely clear to auscultation and percussion.  HEART:  He was in a regular rhythm without significant murmur, gallop or  rub.  ABDOMEN:  I could not appreciate hepatosplenomegaly, abdominal masses or  tenderness. Bowel sounds were normal. He did have a mild ventral hernia  without any evidence of incarceration. Bowel sounds were normal.  EXTREMITIES:  Peripheral extremities showed no swollen joints, edema or  phlebitis.  MENTAL STATUS:  Clear.  RECTAL:  Deferred.   ASSESSMENT:  1. Elevated liver function tests probably from fatty infiltration of      the liver - rule out metabolic liver disease or chronic active      hepatitis.  2. Obesity with metabolic syndrome.  3. NSAID-induced gastroduodenitis with good response to proton pump      inhibitor therapy.  4. 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  5. Family history of lung cancer in his father with negative CT scan      of the chest.  6. History of psoriasis.   RECOMMENDATIONS:  1. Metabolic and viral screening laboratory with hepatic parameters.  2. Upper abdominal ultrasound exam visualized his liver and rule out      cholelithiasis.  3. I have prescribed Protonix 40 mg q.a.m. with avoidance of NSAIDs.  4. The patient will probably need referral to dietary therapy for      management and counseling     and exercising  program.  5. Consider rheumatology referral.     Vania Rea. Jarold Motto, MD, Caleen Essex, FAGA  Electronically Signed    DRP/MedQ  DD: 08/04/2006  DT: 08/04/2006  Job #: [id number]   cc:   Kerby Nora, MD

## 2010-07-20 NOTE — Op Note (Signed)
NAMEHANZEL, PIZZO                            ACCOUNT NO.:  [id number]   MEDICAL RECORD NO.:  [id number]                   PATIENT TYPE:  AMB   LOCATION:  ENDO                                 FACILITY:  [facility]   PHYSICIAN:  Georgiana Spinner, M.D.                 DATE OF BIRTH:  September 11, 1955   DATE OF PROCEDURE:  DATE OF DISCHARGE:                                 OPERATIVE REPORT   PROCEDURE:  Colonoscopy.   INDICATIONS:  Hemoccult positivity.   ANESTHESIA:  Demerol 70 mg, Versed 7.   DESCRIPTION OF PROCEDURE:  With the patient mildly sedated and in the left  lateral decubitus position, the rectal exam was performed which was  unremarkable.  Subsequently the Olympus videoscopic colonoscope was inserted  in the rectum and passed under direct vision to the cecum, identified by  ileocecal valve and appendiceal orifice, both of which were photographed.  From this point, the colonoscope was slowly withdrawn, taking  circumferential views of the colonic mucosa stopping in the rectum which  appeared normal on direct and retroflexed view.  The endoscope was  straightened and withdrawn.  The patient's vital signs and pulse oximetry  remained stable.  The patient tolerated the procedure well without apparent  complications.   FINDINGS:  Unremarkable examination.   PLAN:  Consider repeat in 5-10 years.                                               Georgiana Spinner, M.D.    GMO/MEDQ  D:  04/01/2003  T:  04/01/2003  Job:  [id number]

## 2010-07-20 NOTE — Cardiovascular Report (Signed)
   NAMECLAUD, Francis Cross                            ACCOUNT NO.:  [id number]   MEDICAL RECORD NO.:  [id number]                   PATIENT TYPE:  OIB   LOCATION:  2899                                 FACILITY:  [facility]   PHYSICIAN:  Salvadore Farber, M.D.             DATE OF BIRTH:  02/14/1956   DATE OF PROCEDURE:  DATE OF DISCHARGE:  09/16/2002                              CARDIAC CATHETERIZATION   PROCEDURE:  Left heart catheterization, left ventriculography, coronary  angiography.   INDICATION:  Mr. Casale is a 55 year old gentleman with hypertension and  dyslipidemia who presents with atypical left arm discomfort.  He underwent  exercise tolerance testing, demonstrating preserved left ventricular  systolic function, and a partially reversible inferior defect.  He was  referred for diagnostic angiography.   PROCEDURAL TECHNIQUE:  Informed consent was obtained.  Under 1% lidocaine  local anesthesia, a #6 French sheath was placed in the right femoral artery  using the modified Seldinger technique.  Diagnostic angiography and  ventriculography were performed using JL4, JR4, and pigtail catheters.  The  patient tolerated the procedure well and was transferred to the Holding Room  in stable condition.   COMPLICATIONS:  None.   FINDINGS:  1. LV:  109/8/13.  EF 80% without regional wall motion abnormality.  2. Left main:  Angiographically normal.  3. LAD:  Moderate sized vessel giving rise to a single diagonal branch.  It     is angiographically normal.  4. Ramus intermedius:  Moderate sized vessel, which is angiographically     normal.  5. Circumflex:  Moderately sized vessel giving rise to a single obtuse     marginal.  It is angiographically normal.  6. RCA:  Large, dominant vessel, which is angiographically normal.   IMPRESSION AND PLAN:  Angiographically normal coronary arteries with normal  left ventricular size and systolic function.  Exercise test is falsely  positive.  Suggest  noncardiac etiology to his arm discomfort.  The patient  is to follow up with Dr. Debby Bud for continued primary prevention.                                                Salvadore Farber, M.D.    WED/MEDQ  D:  09/16/2002  T:  09/17/2002  Job:  [id number]  Jesse Sans. Wall, M.D.   Rosalyn Gess Norins, M.D. [facility]   cc:   Thomas C. Wall, M.D.   Rosalyn Gess Norins, M.D. [facility]

## 2010-07-20 NOTE — Op Note (Signed)
Francis Cross, Francis Cross                            ACCOUNT NO.:  [id number]   MEDICAL RECORD NO.:  [id number]                   PATIENT TYPE:  AMB   LOCATION:  ENDO                                 FACILITY:  [facility]   PHYSICIAN:  Georgiana Spinner, M.D.                 DATE OF BIRTH:  [birth year]   DATE OF PROCEDURE:  DATE OF DISCHARGE:                                 OPERATIVE REPORT   ADDENDUM:                                               Georgiana Spinner, M.D.    GMO/MEDQ  D:  04/01/2003  T:  04/01/2003  Job:  [id number]   cc:   Rosalyn Gess. Norins, M.D. [facility]   Maisie Fus C. Wall, M.D.

## 2010-07-20 NOTE — Op Note (Signed)
Francis Cross, Francis Cross                            ACCOUNT NO.:  [id number]   MEDICAL RECORD NO.:  [id number]                   PATIENT TYPE:  AMB   LOCATION:  ENDO                                 FACILITY:  [facility]   PHYSICIAN:  Georgiana Spinner, M.D.                 DATE OF BIRTH:  October 26, 1955   DATE OF PROCEDURE:  01/31/2003  DATE OF DISCHARGE:                                 OPERATIVE REPORT   PROCEDURE:  Upper endoscopy.   INDICATIONS:  Abdominal pain.   ANESTHESIA:  1. Demerol 60 mg.  2. Versed 6 mg.   The patient states abdominal pain has improved somewhat since being started  on Protonix.   DESCRIPTION OF PROCEDURE:  With patient mildly sedated in the left lateral  decubitus position, the Olympus videoscopic endoscope was inserted in the  mouth, passed under direct vision through the esophagus, which appeared  normal.  There was no evidence of Barrett's.  We entered into the stomach.  Fundus, body, antrum, duodenal bulb, and second portion of duodenum were  visualized.  From this point, the endoscope was slowly withdrawn, taking  circumferential views of the duodenal mucosa until the endoscope then pulled  back into the stomach, and the antrum was visualized, and erosions were seen  in the antrum which were photographed and subsequently biopsied.  The  endoscope was placed in retroflexion to view the stomach from below.  The  endoscope was then straightened and withdrawn, taking circumferential views  of the remaining gastric and esophageal mucosa.  The patient's vital signs  and pulse oximeter remained stable.  The patient tolerated the procedure  well without apparent complications.   FINDINGS:  Erosions of antrum, biopsied.  Await biopsy report.  The patient  will call for results and follow up with me as an outpatient.                                               Georgiana Spinner, M.D.    GMO/MEDQ  D:  01/31/2003  T:  01/31/2003  Job:  [id number]   cc:   Rosalyn Gess. Norins,  M.D. [facility]

## 2010-08-20 ENCOUNTER — Other Ambulatory Visit: Payer: Self-pay | Admitting: Family Medicine

## 2010-12-07 ENCOUNTER — Other Ambulatory Visit: Payer: Self-pay | Admitting: Family Medicine

## 2011-03-26 ENCOUNTER — Ambulatory Visit (INDEPENDENT_AMBULATORY_CARE_PROVIDER_SITE_OTHER): Payer: BC Managed Care – PPO

## 2011-03-26 DIAGNOSIS — E236 Other disorders of pituitary gland: Secondary | ICD-10-CM

## 2011-03-26 DIAGNOSIS — I10 Essential (primary) hypertension: Secondary | ICD-10-CM

## 2011-03-26 DIAGNOSIS — J40 Bronchitis, not specified as acute or chronic: Secondary | ICD-10-CM

## 2012-05-07 ENCOUNTER — Ambulatory Visit (INDEPENDENT_AMBULATORY_CARE_PROVIDER_SITE_OTHER): Payer: BC Managed Care – PPO | Admitting: Physician Assistant

## 2012-05-07 VITALS — BP 160/80 | HR 72 | Temp 98.2°F | Resp 16 | Ht 66.5 in | Wt 221.0 lb

## 2012-05-07 DIAGNOSIS — R109 Unspecified abdominal pain: Secondary | ICD-10-CM

## 2012-05-07 DIAGNOSIS — R300 Dysuria: Secondary | ICD-10-CM

## 2012-05-07 LAB — POCT URINALYSIS DIPSTICK
Glucose, UA: NEGATIVE
Leukocytes, UA: NEGATIVE
Nitrite, UA: NEGATIVE
Spec Grav, UA: 1.01
Urobilinogen, UA: 0.2

## 2012-05-07 LAB — POCT UA - MICROSCOPIC ONLY
Bacteria, U Microscopic: NEGATIVE
Casts, Ur, LPF, POC: NEGATIVE
Yeast, UA: NEGATIVE

## 2012-05-07 MED ORDER — NAPROXEN 500 MG PO TABS: 500.0000 mg | ORAL_TABLET | Freq: Two times a day (BID) | ORAL | Status: DC

## 2012-05-07 MED ORDER — TAMSULOSIN HCL 0.4 MG PO CAPS: 0.4000 mg | ORAL_CAPSULE | Freq: Every day | ORAL | Status: DC

## 2012-05-07 MED ORDER — PHENAZOPYRIDINE HCL 200 MG PO TABS: 200.0000 mg | ORAL_TABLET | Freq: Three times a day (TID) | ORAL | Status: DC | PRN

## 2012-05-07 NOTE — Patient Instructions (Addendum)
Continue to drink a lot of water.  Pick up prescriptions.  Use urine strainer.

## 2012-05-07 NOTE — Progress Notes (Signed)
  Subjective:    Patient ID: Francis Cross, male    DOB: 1955/10/22, 57 y.o.   MRN: [id number]  HPI 57 yr old CM presents with pain with urination and urethral pain on and off today.  This was followed after having some R flank pain about 2 weeks ago that has occurred intermittently and started radiating around to the front and into the groin. He had some nausea with it yesterday and that was when he really had the radiating pain. No fever.  He denies prostate type symptoms.  He denies abdominal pain.  No change in bowel habits. Pain seemed to be relieved with ibuprofen.  He didn't take his BP med today. No rash.  [employer].  Denies STD risk factors.  Review of Systems  All other systems reviewed and are negative.       Objective:   Physical Exam  Nursing note and vitals reviewed. Constitutional: He is oriented to person, place, and time. He appears well-developed and well-nourished.  HENT:  Head: Normocephalic and atraumatic.  Neck: Normal range of motion.  Cardiovascular: Normal rate, regular rhythm and normal heart sounds.   Pulmonary/Chest: Effort normal and breath sounds normal.  Abdominal: Soft. Bowel sounds are normal.  No CVA tenderness.  Neurological: He is alert and oriented to person, place, and time.  Skin: Skin is warm and dry. No rash noted.  Psychiatric: He has a normal mood and affect. His behavior is normal.     Results for orders placed in visit on 05/07/12  POCT UA - MICROSCOPIC ONLY      Result Value Range   WBC, Ur, HPF, POC 0-1     RBC, urine, microscopic neg     Bacteria, U Microscopic neg     Mucus, UA neg     Epithelial cells, urine per micros 0-2     Crystals, Ur, HPF, POC neg     Casts, Ur, LPF, POC neg     Yeast, UA neg    POCT URINALYSIS DIPSTICK      Result Value Range   Color, UA yellow     Clarity, UA clear     Glucose, UA neg     Bilirubin, UA neg     Ketones, UA neg     Spec Grav, UA 1.010     Blood, UA neg     pH, UA 5.5     Protein,  UA neg     Urobilinogen, UA 0.2     Nitrite, UA neg     Leukocytes, UA Negative        Assessment & Plan:  Intermittent urethral pain s/p having R flank pain that was radiating. He likely has already passed a small stone which is why he may be having urethral burning.  He will continue to push fluids.  I gave him a urine strainer.  I will touch base with him tomorrow morning and order a CT urogram if he has trouble overnight.  If the pain becomes severe he agrees to call 911/go to the ER. If he has pain into the weekend, please order CT urogram.

## 2012-05-09 ENCOUNTER — Telehealth: Payer: Self-pay | Admitting: Physician Assistant

## 2012-05-09 NOTE — Telephone Encounter (Signed)
I spoke with patient.  He has continued to feel less and less pain over the last 36 hours.  He will let me know if anything changes

## 2012-05-15 ENCOUNTER — Other Ambulatory Visit: Payer: Self-pay | Admitting: Family Medicine

## 2012-05-17 ENCOUNTER — Other Ambulatory Visit: Payer: Self-pay | Admitting: Physician Assistant

## 2012-06-07 ENCOUNTER — Ambulatory Visit (INDEPENDENT_AMBULATORY_CARE_PROVIDER_SITE_OTHER): Payer: BC Managed Care – PPO | Admitting: Family Medicine

## 2012-06-07 VITALS — BP 140/80 | HR 86 | Temp 98.6°F | Resp 16 | Ht 65.5 in | Wt 216.0 lb

## 2012-06-07 DIAGNOSIS — R5383 Other fatigue: Secondary | ICD-10-CM

## 2012-06-07 DIAGNOSIS — L408 Other psoriasis: Secondary | ICD-10-CM

## 2012-06-07 DIAGNOSIS — R5381 Other malaise: Secondary | ICD-10-CM

## 2012-06-07 DIAGNOSIS — I10 Essential (primary) hypertension: Secondary | ICD-10-CM

## 2012-06-07 DIAGNOSIS — N200 Calculus of kidney: Secondary | ICD-10-CM

## 2012-06-07 DIAGNOSIS — L409 Psoriasis, unspecified: Secondary | ICD-10-CM

## 2012-06-07 LAB — COMPREHENSIVE METABOLIC PANEL
ALT: 63 U/L — ABNORMAL HIGH (ref 0–53)
AST: 41 U/L — ABNORMAL HIGH (ref 0–37)
Albumin: 4.5 g/dL (ref 3.5–5.2)
Alkaline Phosphatase: 57 U/L (ref 39–117)
BUN: 12 mg/dL (ref 6–23)
CO2: 32 mEq/L (ref 19–32)
Calcium: 9.8 mg/dL (ref 8.4–10.5)
Chloride: 99 mEq/L (ref 96–112)
Creat: 1.01 mg/dL (ref 0.50–1.35)
Glucose, Bld: 167 mg/dL — ABNORMAL HIGH (ref 70–99)
Potassium: 3.7 mEq/L (ref 3.5–5.3)
Sodium: 138 mEq/L (ref 135–145)
Total Bilirubin: 0.6 mg/dL (ref 0.3–1.2)
Total Protein: 7 g/dL (ref 6.0–8.3)

## 2012-06-07 LAB — POCT CBC
Granulocyte percent: 58 %G (ref 37–80)
HCT, POC: 47.1 % (ref 43.5–53.7)
Hemoglobin: 15.2 g/dL (ref 14.1–18.1)
Lymph, poc: 2.5 (ref 0.6–3.4)
MCH, POC: 29.9 pg (ref 27–31.2)
MCHC: 32.3 g/dL (ref 31.8–35.4)
MCV: 92.6 fL (ref 80–97)
MID (cbc): 0.6 (ref 0–0.9)
MPV: 9.4 fL (ref 0–99.8)
POC Granulocyte: 4.3 (ref 2–6.9)
POC LYMPH PERCENT: 33.9 %L (ref 10–50)
POC MID %: 8.1 %M (ref 0–12)
Platelet Count, POC: 213 10*3/uL (ref 142–424)
RBC: 5.09 M/uL (ref 4.69–6.13)
RDW, POC: 13.3 %
WBC: 7.4 10*3/uL (ref 4.6–10.2)

## 2012-06-07 LAB — TSH: TSH: 1.662 u[IU]/mL (ref 0.350–4.500)

## 2012-06-07 LAB — LIPID PANEL
Cholesterol: 217 mg/dL — ABNORMAL HIGH (ref 0–200)
HDL: 42 mg/dL (ref >39)
LDL Cholesterol: 142 mg/dL — ABNORMAL HIGH (ref 0–99)
Total CHOL/HDL Ratio: 5.2 Ratio
Triglycerides: 167 mg/dL — ABNORMAL HIGH (ref <150)
VLDL: 33 mg/dL (ref 0–40)

## 2012-06-07 LAB — POCT SEDIMENTATION RATE: POCT SED RATE: 5 mm/hr (ref 0–22)

## 2012-06-07 LAB — URIC ACID: Uric Acid, Serum: 6.9 mg/dL (ref 4.0–7.8)

## 2012-06-07 MED ORDER — LISINOPRIL-HYDROCHLOROTHIAZIDE 20-12.5 MG PO TABS: 1.0000 | ORAL_TABLET | Freq: Every day | ORAL | Status: DC

## 2012-06-07 MED ORDER — CALCIPOTRIENE 0.005 % EX CREA: TOPICAL_CREAM | Freq: Two times a day (BID) | CUTANEOUS | Status: DC

## 2012-06-07 NOTE — Progress Notes (Signed)
  Subjective:    Patient ID: Chima Astorino, male    DOB: [birth year], 56 y.o.   MRN: [id number]  HPI 57 yo male patient comes in today for medication refills. Pt would like to discuss Benicar and the side effects. His daughter looked up side effects and he is concerned. He states the medication is very expensive.  He has had some itching all over his body for the past few weeks. He has eczema and psoriasis. He uses cream but it is not helping much. He would like to try a topical vitamin d for his face.   He would like to have his liver function test. He has a PE in April, his daughter will be accompanying him to that appointment.    He was on testosterone injections. He has not had this checked since stopping the injections.   He is working different hours and longer than usual and is fatigued often.    Review of Systems     Objective:   Physical Exam HEENT: normal Neck:  Supple, no adenop Chest:  Clear Heart:  Regular without murmur, gallop or fub Abdomen:  Protuberant, no HSM Ext:  No edema Results for orders placed in visit on 06/07/12  POCT CBC      Result Value Range   WBC 7.4  4.6 - 10.2 K/uL   Lymph, poc 2.5  0.6 - 3.4   POC LYMPH PERCENT 33.9  10 - 50 %L   MID (cbc) 0.6  0 - 0.9   POC MID % 8.1  0 - 12 %M   POC Granulocyte 4.3  2 - 6.9   Granulocyte percent 58.0  37 - 80 %G   RBC 5.09  4.69 - 6.13 M/uL   Hemoglobin 15.2  14.1 - 18.1 g/dL   HCT, POC 40.9  81.1 - 53.7 %   MCV 92.6  80 - 97 fL   MCH, POC 29.9  27 - 31.2 pg   MCHC 32.3  31.8 - 35.4 g/dL   RDW, POC 91.4     Platelet Count, POC 213  142 - 424 K/uL   MPV 9.4  0 - 99.8 fL  POCT SEDIMENTATION RATE      Result Value Range   POCT SED RATE 5  0 - 22 mm/hr         Assessment & Plan:  Fatigue - Plan: Lipid panel, Testosterone, free, total, TSH, PSA, Comprehensive metabolic panel, POCT CBC, POCT SEDIMENTATION RATE  Hypertension - Plan: Lipid panel, Testosterone, free, total, TSH, PSA, Comprehensive  metabolic panel, POCT CBC, POCT SEDIMENTATION RATE  Psoriasis - Plan: calcipotriene (DOVONOX) 0.005 % cream, Vitamin D, 25-hydroxy  Kidney stone - Plan: Uric Acid, US Renal

## 2012-06-08 LAB — VITAMIN D 25 HYDROXY (VIT D DEFICIENCY, FRACTURES): Vit D, 25-Hydroxy: 39 ng/mL (ref 30–89)

## 2012-06-08 LAB — TESTOSTERONE, FREE, TOTAL, SHBG
Sex Hormone Binding: 23 nmol/L (ref 13–71)
Testosterone, Free: 62.6 pg/mL (ref 47.0–244.0)
Testosterone-% Free: 2.4 % (ref 1.6–2.9)
Testosterone: 265 ng/dL — ABNORMAL LOW (ref 300–890)

## 2012-06-08 LAB — PSA: PSA: 0.76 ng/mL (ref <=4.00)

## 2012-06-19 ENCOUNTER — Ambulatory Visit
Admission: RE | Admit: 2012-06-19 | Discharge: 2012-06-19 | Disposition: A | Discharge status: Home / Self Care | Payer: BC Managed Care – PPO | Source: Ambulatory Visit | Attending: Family Medicine | Admitting: Family Medicine

## 2012-06-19 DIAGNOSIS — N200 Calculus of kidney: Secondary | ICD-10-CM

## 2012-07-06 ENCOUNTER — Ambulatory Visit: Payer: BC Managed Care – PPO | Admitting: Family Medicine

## 2012-07-20 ENCOUNTER — Ambulatory Visit (INDEPENDENT_AMBULATORY_CARE_PROVIDER_SITE_OTHER): Payer: BC Managed Care – PPO | Admitting: Family Medicine

## 2012-07-20 ENCOUNTER — Ambulatory Visit: Payer: BC Managed Care – PPO

## 2012-07-20 ENCOUNTER — Encounter: Payer: Self-pay | Admitting: Family Medicine

## 2012-07-20 ENCOUNTER — Telehealth: Payer: Self-pay | Admitting: *Deleted

## 2012-07-20 VITALS — BP 128/82 | HR 68 | Temp 97.8°F | Resp 16 | Ht 65.5 in | Wt 209.0 lb

## 2012-07-20 DIAGNOSIS — R079 Chest pain, unspecified: Secondary | ICD-10-CM

## 2012-07-20 DIAGNOSIS — R739 Hyperglycemia, unspecified: Secondary | ICD-10-CM

## 2012-07-20 DIAGNOSIS — Z0000 Encounter for general adult medical examination without abnormal findings: Secondary | ICD-10-CM

## 2012-07-20 DIAGNOSIS — E669 Obesity, unspecified: Secondary | ICD-10-CM

## 2012-07-20 DIAGNOSIS — R7309 Other abnormal glucose: Secondary | ICD-10-CM

## 2012-07-20 DIAGNOSIS — E291 Testicular hypofunction: Secondary | ICD-10-CM

## 2012-07-20 LAB — GLUCOSE, POCT (MANUAL RESULT ENTRY): POC Glucose: 126 mg/dl — AB (ref 70–99)

## 2012-07-20 LAB — IBC PANEL
%SAT: 24 % (ref 20–55)
TIBC: 355 ug/dL (ref 215–435)
UIBC: 269 ug/dL (ref 125–400)

## 2012-07-20 LAB — FERRITIN: Ferritin: 103 ng/mL (ref 22–322)

## 2012-07-20 LAB — IFOBT (OCCULT BLOOD): IFOBT: NEGATIVE

## 2012-07-20 LAB — IRON: Iron: 86 ug/dL (ref 42–165)

## 2012-07-20 LAB — POCT GLYCOSYLATED HEMOGLOBIN (HGB A1C): Hemoglobin A1C: 6.5

## 2012-07-20 MED ORDER — TESTOSTERONE 20.25 MG/ACT (1.62%) TD GEL: 1.0000 | Freq: Every evening | TRANSDERMAL | Status: DC

## 2012-07-20 NOTE — Patient Instructions (Signed)
Health Maintenance, Males A healthy lifestyle and preventative care can promote health and wellness.  Maintain regular health, dental, and eye exams.  Eat a healthy diet. Foods like vegetables, fruits, whole grains, low-fat dairy products, and lean protein foods contain the nutrients you need without too many calories. Decrease your intake of foods high in solid fats, added sugars, and salt. Get information about a proper diet from your caregiver, if necessary.  Regular physical exercise is one of the most important things you can do for your health. Most adults should get at least 150 minutes of moderate-intensity exercise (any activity that increases your heart rate and causes you to sweat) each week. In addition, most adults need muscle-strengthening exercises on 2 or more days a week.   Maintain a healthy weight. The body mass index (BMI) is a screening tool to identify possible weight problems. It provides an estimate of body fat based on height and weight. Your caregiver can help determine your BMI, and can help you achieve or maintain a healthy weight. For adults 20 years and older:  A BMI below 18.5 is considered underweight.  A BMI of 18.5 to 24.9 is normal.  A BMI of 25 to 29.9 is considered overweight.  A BMI of 30 and above is considered obese.  Maintain normal blood lipids and cholesterol by exercising and minimizing your intake of saturated fat. Eat a balanced diet with plenty of fruits and vegetables. Blood tests for lipids and cholesterol should begin at age 20 and be repeated every 5 years. If your lipid or cholesterol levels are high, you are over 50, or you are a high risk for heart disease, you may need your cholesterol levels checked more frequently.Ongoing high lipid and cholesterol levels should be treated with medicines, if diet and exercise are not effective.  If you smoke, find out from your caregiver how to quit. If you do not use tobacco, do not start.  If you  choose to drink alcohol, do not exceed 2 drinks per day. One drink is considered to be 12 ounces (355 mL) of beer, 5 ounces (148 mL) of wine, or 1.5 ounces (44 mL) of liquor.  Avoid use of street drugs. Do not share needles with anyone. Ask for help if you need support or instructions about stopping the use of drugs.  High blood pressure causes heart disease and increases the risk of stroke. Blood pressure should be checked at least every 1 to 2 years. Ongoing high blood pressure should be treated with medicines if weight loss and exercise are not effective.  If you are 45 to 56 years old, ask your caregiver if you should take aspirin to prevent heart disease.  Diabetes screening involves taking a blood sample to check your fasting blood sugar level. This should be done once every 3 years, after age 45, if you are within normal weight and without risk factors for diabetes. Testing should be considered at a younger age or be carried out more frequently if you are overweight and have at least 1 risk factor for diabetes.  Colorectal cancer can be detected and often prevented. Most routine colorectal cancer screening begins at the age of 50 and continues through age 75. However, your caregiver may recommend screening at an earlier age if you have risk factors for colon cancer. On a yearly basis, your caregiver may provide home test kits to check for hidden blood in the stool. Use of a small camera at the end of a tube,   to directly examine the colon (sigmoidoscopy or colonoscopy), can detect the earliest forms of colorectal cancer. Talk to your caregiver about this at age 50, when routine screening begins. Direct examination of the colon should be repeated every 5 to 10 years through age 75, unless early forms of pre-cancerous polyps or small growths are found.  Hepatitis C blood testing is recommended for all people born from 1945 through 1965 and any individual with known risks for hepatitis C.  Healthy  men should no longer receive prostate-specific antigen (PSA) blood tests as part of routine cancer screening. Consult with your caregiver about prostate cancer screening.  Testicular cancer screening is not recommended for adolescents or adult males who have no symptoms. Screening includes self-exam, caregiver exam, and other screening tests. Consult with your caregiver about any symptoms you have or any concerns you have about testicular cancer.  Practice safe sex. Use condoms and avoid high-risk sexual practices to reduce the spread of sexually transmitted infections (STIs).  Use sunscreen with a sun protection factor (SPF) of 30 or greater. Apply sunscreen liberally and repeatedly throughout the day. You should seek shade when your shadow is shorter than you. Protect yourself by wearing long sleeves, pants, a wide-brimmed hat, and sunglasses year round, whenever you are outdoors.  Notify your caregiver of new moles or changes in moles, especially if there is a change in shape or color. Also notify your caregiver if a mole is larger than the size of a pencil eraser.  A one-time screening for abdominal aortic aneurysm (AAA) and surgical repair of large AAAs by sound wave imaging (ultrasonography) is recommended for ages 65 to 75 years who are current or former smokers.  Stay current with your immunizations. Document Released: 08/17/2007 Document Revised: 05/13/2011 Document Reviewed: 07/16/2010 [employer] Patient Information 2013 [employer].  

## 2012-07-20 NOTE — Telephone Encounter (Signed)
Pharmacist called back and reported that the Androgel is not covered by pt's ins. Their covered alternatives are Axiron, Azucena Freed and Androderm. Dr L, can we send in a new Rx for one of these?

## 2012-07-20 NOTE — Progress Notes (Addendum)
Patient ID: Myrle Dues MRN: [id number], DOB: 06/03/1955 57 y.o. Date of Encounter: 07/20/2012, 8:18 AM  Primary Physician: Kerby Nora, MD  Chief Complaint: Physical (CPE)  HPI: 57 y.o. y/o male with history noted below here for CPE.  Doing well. C/o itching.  He has had abnl ferritin (mild form of hemochromatosis), abnl sugar (<150), overweight (working on weight loss), abnl LFT's and elevated uric acid. He has had several episodes of left chest pain, brief and mild.  Father died of lung cancer.  Patient is former smoker He also notes some intermittent numbness on dorsal left hand involving third finger as well He is working on his weight and dropped 7 pounds in last month. He has h/o mild hemochromatosis with chronic elevated LFT's and high iron levels.  He typically donates a unit of blood several times a year, last time being 6 months ago.  His blood sugars have ranged from low 100's to as high as 300, but typically run around 120.   Not exercising regularly.  Married.  Plans to retire in June.  Colonoscopy: unsure Last dT:  2010 Last PSA:  Last month  Review of Systems: Consitutional: No fever, chills, fatigue, night sweats, lymphadenopathy, or weight changes. Eyes: No visual changes, eye redness, or discharge. ENT/Mouth: Ears: No otalgia, tinnitus, hearing loss, discharge. Nose: No congestion, rhinorrhea, sinus pain, or epistaxis. Throat: No sore throat, post nasal drip, or teeth pain. Cardiovascular: No CP, palpitations, diaphoresis, DOE, edema, orthopnea, PND. Respiratory: No cough, hemoptysis, SOB, or wheezing. Gastrointestinal: No anorexia, dysphagia, reflux, pain, nausea, vomiting, hematemesis, diarrhea, constipation, BRBPR, or melena. Genitourinary: No dysuria, frequency, urgency, hematuria, incontinence, nocturia, decreased urinary stream, discharge, impotence, or testicular pain/masses. Musculoskeletal: No decreased ROM, myalgias, stiffness, joint swelling, or  weakness. Skin: No erythema, lesion changes, pain, warmth, jaundice,  He has had pruritis, psoriasis Neurological: No headache, dizziness, syncope, seizures, tremors, memory loss, coordination problems, or paresthesias. Psychological: No anxiety, depression, hallucinations, SI/HI. Endocrine: No fatigue, polydipsia, polyphagia, polyuria, or known diabetes. All other systems were reviewed and are otherwise negative.  Past Medical History  Diagnosis Date  . Hypertension      No past surgical history on file.  Home Meds:  Prior to Admission medications   Medication Sig Start Date End Date Taking? Authorizing Provider  lisinopril-hydrochlorothiazide (ZESTORETIC) 20-12.5 MG per tablet Take 1 tablet by mouth daily. 06/07/12  Yes Elvina Sidle, MD  calcipotriene (DOVONOX) 0.005 % cream Apply topically 2 (two) times daily. 06/07/12   Elvina Sidle, MD  naproxen (NAPROSYN) 500 MG tablet Take 1 tablet (500 mg total) by mouth 2 (two) times daily with a meal. Prn pain 05/07/12   Anders Simmonds, PA-C    Allergies:  Allergies  Allergen Reactions  . Cephalexin     REACTION: Rash  . Penicillins     REACTION: Rash    History   Social History  . Marital Status: Married    Spouse Name: N/A    Number of Children: N/A  . Years of Education: N/A   Occupational History  . Not on file.   Social History Main Topics  . Smoking status: Former Games developer  . Smokeless tobacco: Not on file  . Alcohol Use: Not on file  . Drug Use: Not on file  . Sexually Active: Not on file   Other Topics Concern  . Not on file   Social History Narrative  . No narrative on file    No family history on file.  Physical Exam:  Patient seen and examined in presence of daughter. Blood pressure 128/82, pulse 68, temperature 97.8 F (36.6 C), temperature source Oral, resp. rate 16, height 5' 5.5" (1.664 m), weight 209 lb (94.802 kg), SpO2 98.00%.  General: Well developed, well nourished, in no acute  distress. HEENT: Normocephalic, atraumatic. Conjunctiva pink, sclera non-icteric. Pupils 2 mm constricting to 1 mm, round, regular, and equally reactive to light and accomodation. EOMI. Internal auditory canal clear. TMs with good cone of light and without pathology. Nasal mucosa pink. Nares are without discharge. No sinus tenderness. Oral mucosa pink. Dentition good. Pharynx without exudate.   Neck: Supple. Trachea midline. No thyromegaly. Full ROM. No lymphadenopathy. Lungs: Clear to auscultation bilaterally without wheezes, rales, or rhonchi. Breathing is of normal effort and unlabored. Cardiovascular: RRR with S1 S2. No murmurs, rubs, or gallops appreciated. Distal pulses 2+ symmetrically. No carotid or abdominal bruits Abdomen: Soft, non-tender, non-distended with normoactive bowel sounds. No hepatosplenomegaly or masses. No rebound/guarding. No CVA tenderness. Without hernias.  Rectal: No external hemorrhoids or fissures. Rectal vault without masses.  Genitourinary:  uncircumcised male. No penile lesions. Testes descended bilaterally, and smooth without tenderness or masses.  Musculoskeletal: Full range of motion and 5/5 strength throughout. Without swelling, atrophy, tenderness, crepitus, or warmth. Extremities without clubbing, cyanosis, or edema. Calves supple. Skin: Warm and moist without erythema, ecchymosis, wounds.  Isolated left elbow psoriatic lesion.  Multiple lentigos on extremities, none of which are suspicious.  Appendectomy scar and epigastric skin excision scar. Neuro: A+Ox3. CN II-XII grossly intact. Moves all extremities spontaneously. Full sensation throughout. Normal gait. DTR 2+ throughout upper and lower extremities. Finger to nose intact. Psych:  Responds to questions appropriately with a normal affect.  [facility] reading (PRIMARY) by  Dr. Milus Glazier:  CXR: .clear EKG:  Normal Results for orders placed in visit on 07/20/12  GLUCOSE, POCT (MANUAL RESULT ENTRY)      Result Value  Range   POC Glucose 126 (*) 70 - 99 mg/dl  POCT GLYCOSYLATED HEMOGLOBIN (HGB A1C)      Result Value Range   Hemoglobin A1C 6.5    IFOBT (OCCULT BLOOD)      Result Value Range   IFOBT Negative       Assessment/Plan:  57 y.o. y/o  male here for CPE Routine general medical examination at a health care facility - Plan: EKG 12-Lead, IFOBT POC (occult bld, rslt in office)  Iron overload - Plan: Ferritin, IBC Panel  Hyperglycemia - Plan: POCT glucose (manual entry), POCT glycosylated hemoglobin (Hb A1C)  Chest pain - Plan: DG Chest 2 View  Hypogonadism male - Plan: Testosterone (ANDROGEL PUMP) 20.25 MG/ACT (1.62%) GEL  Check with Dr. Jarold Motto re:  Last colonoscopy-->  Reported to be 2010 Signed, Elvina Sidle, MD 07/20/2012 8:18 AM

## 2012-07-20 NOTE — Telephone Encounter (Signed)
Faxed prescription testosterone gel to [facility] in [place] [phone]) per Dr Milus Glazier.

## 2012-07-22 ENCOUNTER — Telehealth: Payer: Self-pay | Admitting: Family Medicine

## 2012-07-22 NOTE — Telephone Encounter (Signed)
Faxed RX for Testosterone Cream

## 2012-10-13 ENCOUNTER — Ambulatory Visit: Payer: Self-pay | Admitting: Family Medicine

## 2012-10-13 VITALS — BP 130/82 | HR 68 | Temp 97.9°F | Resp 16 | Ht 66.0 in | Wt 211.0 lb

## 2012-10-13 DIAGNOSIS — Z008 Encounter for other general examination: Secondary | ICD-10-CM

## 2012-10-13 DIAGNOSIS — Z0289 Encounter for other administrative examinations: Secondary | ICD-10-CM

## 2012-10-13 NOTE — Progress Notes (Signed)
  Subjective:    Patient ID: Francis Cross, male    DOB: 10-09-1955, 57 y.o.   MRN: [id number]  HPI Francis Cross is a 57 y.o. male  DOT physical.  PCP: Dr. Milus Glazier  Hx of HTN,  No hx of PTCA/MI/heart disease.  Retired.  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   Review of Systems  Respiratory: Negative for shortness of breath.   Cardiovascular: Negative for chest pain.  Neurological: Negative for weakness.  other as above and on DOT paperwork.      Objective:   Physical Exam  Vitals reviewed. Constitutional: He is oriented to person, place, and time. He appears well-developed and well-nourished.  HENT:  Head: Normocephalic and atraumatic.  Right Ear: External ear normal.  Left Ear: External ear normal.  Mouth/Throat: Oropharynx is clear and moist.  Eyes: Conjunctivae and EOM are normal. Pupils are equal, round, and reactive to light.  Neck: Normal range of motion. Neck supple. No thyromegaly present.  Cardiovascular: Normal rate, regular rhythm, normal heart sounds and intact distal pulses.   Pulmonary/Chest: Effort normal and breath sounds normal. No respiratory distress. He has no wheezes.  Abdominal: Soft. He exhibits no distension. There is no tenderness. Hernia confirmed negative in the right inguinal area and confirmed negative in the left inguinal area.  Musculoskeletal: Normal range of motion. He exhibits no edema and no tenderness.  Lymphadenopathy:    He has no cervical adenopathy.  Neurological: He is alert and oriented to person, place, and time. He has normal reflexes.  Skin: Skin is warm and dry.  Psychiatric: He has a normal mood and affect. His behavior is normal.       Assessment & Plan:  Francis Cross is a 57 y.o. male Health examination of defined subpopulation  DOT physical.  Hx of HTN. No other concerns on exam - 1 year card.  See paperwork. Keep regular follow up with primary provider.

## 2012-10-15 ENCOUNTER — Ambulatory Visit: Payer: BC Managed Care – PPO | Admitting: Family Medicine

## 2012-10-16 ENCOUNTER — Encounter: Payer: Self-pay | Admitting: Family Medicine

## 2013-01-07 ENCOUNTER — Other Ambulatory Visit: Payer: Self-pay

## 2013-05-06 ENCOUNTER — Telehealth: Payer: Self-pay | Admitting: Family Medicine

## 2013-05-06 NOTE — Telephone Encounter (Signed)
Pt would like to know if you would accept him as a pt.  his daughter is michelle flinchum Health visitor) and her husband is apt of yours. Chuck Flinchum. Sharyn Lull worked here w/ gina hoody)

## 2013-05-11 ENCOUNTER — Other Ambulatory Visit: Payer: Self-pay | Admitting: Family Medicine

## 2013-07-12 ENCOUNTER — Other Ambulatory Visit: Payer: Self-pay | Admitting: Family Medicine

## 2013-07-29 ENCOUNTER — Other Ambulatory Visit: Payer: Self-pay | Admitting: Family Medicine

## 2013-09-20 ENCOUNTER — Encounter: Payer: Self-pay | Admitting: Family Medicine

## 2013-09-20 ENCOUNTER — Ambulatory Visit (INDEPENDENT_AMBULATORY_CARE_PROVIDER_SITE_OTHER): Payer: BC Managed Care – PPO | Admitting: Family Medicine

## 2013-09-20 ENCOUNTER — Other Ambulatory Visit: Payer: Self-pay | Admitting: Family Medicine

## 2013-09-20 ENCOUNTER — Ambulatory Visit (INDEPENDENT_AMBULATORY_CARE_PROVIDER_SITE_OTHER): Payer: BC Managed Care – PPO

## 2013-09-20 VITALS — BP 130/90 | HR 66 | Temp 97.7°F | Resp 16 | Ht 66.0 in | Wt 206.6 lb

## 2013-09-20 DIAGNOSIS — R079 Chest pain, unspecified: Secondary | ICD-10-CM

## 2013-09-20 DIAGNOSIS — R5383 Other fatigue: Secondary | ICD-10-CM

## 2013-09-20 DIAGNOSIS — H1045 Other chronic allergic conjunctivitis: Secondary | ICD-10-CM

## 2013-09-20 DIAGNOSIS — R7309 Other abnormal glucose: Secondary | ICD-10-CM

## 2013-09-20 DIAGNOSIS — E785 Hyperlipidemia, unspecified: Secondary | ICD-10-CM

## 2013-09-20 DIAGNOSIS — R5381 Other malaise: Secondary | ICD-10-CM

## 2013-09-20 DIAGNOSIS — R739 Hyperglycemia, unspecified: Secondary | ICD-10-CM

## 2013-09-20 DIAGNOSIS — I10 Essential (primary) hypertension: Secondary | ICD-10-CM

## 2013-09-20 DIAGNOSIS — E119 Type 2 diabetes mellitus without complications: Secondary | ICD-10-CM

## 2013-09-20 DIAGNOSIS — H1013 Acute atopic conjunctivitis, bilateral: Secondary | ICD-10-CM

## 2013-09-20 DIAGNOSIS — Z0000 Encounter for general adult medical examination without abnormal findings: Secondary | ICD-10-CM

## 2013-09-20 DIAGNOSIS — E291 Testicular hypofunction: Secondary | ICD-10-CM

## 2013-09-20 LAB — CBC WITH DIFFERENTIAL/PLATELET
Basophils Absolute: 0.1 10*3/uL (ref 0.0–0.1)
Basophils Relative: 1 % (ref 0–1)
Eosinophils Absolute: 0.2 10*3/uL (ref 0.0–0.7)
Eosinophils Relative: 3 % (ref 0–5)
HCT: 47.5 % (ref 39.0–52.0)
Hemoglobin: 16.3 g/dL (ref 13.0–17.0)
Lymphocytes Relative: 30 % (ref 12–46)
Lymphs Abs: 1.7 10*3/uL (ref 0.7–4.0)
MCH: 30.6 pg (ref 26.0–34.0)
MCHC: 34.3 g/dL (ref 30.0–36.0)
MCV: 89.1 fL (ref 78.0–100.0)
Monocytes Absolute: 0.5 10*3/uL (ref 0.1–1.0)
Monocytes Relative: 9 % (ref 3–12)
Neutro Abs: 3.1 10*3/uL (ref 1.7–7.7)
Neutrophils Relative %: 57 % (ref 43–77)
Platelets: 206 10*3/uL (ref 150–400)
RBC: 5.33 MIL/uL (ref 4.22–5.81)
RDW: 13 % (ref 11.5–15.5)
WBC: 5.5 10*3/uL (ref 4.0–10.5)

## 2013-09-20 LAB — POCT URINALYSIS DIPSTICK
Bilirubin, UA: NEGATIVE
Blood, UA: NEGATIVE
Glucose, UA: 100
Ketones, UA: NEGATIVE
Leukocytes, UA: NEGATIVE
Nitrite, UA: NEGATIVE
Protein, UA: NEGATIVE
Spec Grav, UA: 1.015
Urobilinogen, UA: 0.2
pH, UA: 5.5

## 2013-09-20 LAB — COMPREHENSIVE METABOLIC PANEL
ALT: 52 U/L (ref 0–53)
AST: 33 U/L (ref 0–37)
Albumin: 4.8 g/dL (ref 3.5–5.2)
Alkaline Phosphatase: 76 U/L (ref 39–117)
BUN: 12 mg/dL (ref 6–23)
CO2: 29 mEq/L (ref 19–32)
Calcium: 9.7 mg/dL (ref 8.4–10.5)
Chloride: 97 mEq/L (ref 96–112)
Creat: 0.94 mg/dL (ref 0.50–1.35)
Glucose, Bld: 216 mg/dL — ABNORMAL HIGH (ref 70–99)
Potassium: 4.5 mEq/L (ref 3.5–5.3)
Sodium: 136 mEq/L (ref 135–145)
Total Bilirubin: 0.5 mg/dL (ref 0.2–1.2)
Total Protein: 7.1 g/dL (ref 6.0–8.3)

## 2013-09-20 LAB — LIPID PANEL
Cholesterol: 274 mg/dL — ABNORMAL HIGH (ref 0–200)
HDL: 48 mg/dL (ref >39)
LDL Cholesterol: 193 mg/dL — ABNORMAL HIGH (ref 0–99)
Total CHOL/HDL Ratio: 5.7 Ratio
Triglycerides: 166 mg/dL — ABNORMAL HIGH (ref <150)
VLDL: 33 mg/dL (ref 0–40)

## 2013-09-20 LAB — IRON AND TIBC
%SAT: 18 % — ABNORMAL LOW (ref 20–55)
Iron: 71 ug/dL (ref 42–165)
TIBC: 387 ug/dL (ref 215–435)
UIBC: 316 ug/dL (ref 125–400)

## 2013-09-20 LAB — THYROID PANEL WITH TSH
Free Thyroxine Index: 3.3 (ref 1.0–3.9)
T3 Uptake: 35.2 % (ref 22.5–37.0)
T4, Total: 9.4 ug/dL (ref 5.0–12.5)
TSH: 1.618 u[IU]/mL (ref 0.350–4.500)

## 2013-09-20 LAB — C-REACTIVE PROTEIN: CRP: <0.5 mg/dL (ref <0.60)

## 2013-09-20 LAB — VITAMIN B12: Vitamin B-12: 560 pg/mL (ref 211–911)

## 2013-09-20 LAB — FERRITIN: Ferritin: 106 ng/mL (ref 22–322)

## 2013-09-20 LAB — VITAMIN D 25 HYDROXY (VIT D DEFICIENCY, FRACTURES): Vit D, 25-Hydroxy: 65 ng/mL (ref 30–89)

## 2013-09-20 LAB — POCT GLYCOSYLATED HEMOGLOBIN (HGB A1C): Hemoglobin A1C: 10.5

## 2013-09-20 LAB — CORTISOL: Cortisol, Plasma: 11.1 ug/dL

## 2013-09-20 MED ORDER — METFORMIN HCL 500 MG PO TABS: 500.0000 mg | ORAL_TABLET | Freq: Every day | ORAL | Status: DC

## 2013-09-20 MED ORDER — LISINOPRIL-HYDROCHLOROTHIAZIDE 20-12.5 MG PO TABS: 1.0000 | ORAL_TABLET | Freq: Every day | ORAL | Status: DC

## 2013-09-20 MED ORDER — ROSUVASTATIN CALCIUM 10 MG PO TABS: 10.0000 mg | ORAL_TABLET | Freq: Every day | ORAL | Status: DC

## 2013-09-20 MED ORDER — KETOROLAC TROMETHAMINE 0.5 % OP SOLN
1.0000 [drp] | Freq: Two times a day (BID) | OPHTHALMIC | Status: DC | PRN
Start: 2013-09-20 — End: 2014-12-15
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
## 2013-09-20 NOTE — Patient Instructions (Signed)
Atypical chest pain:  I will try to arrange a stress test in next week Elevated sugars:  Entire lab package pending.  Continue efforts at weight loss, eliminating Coke.  We may need to add metformin.  Hemoglobin A1C is elevated.  I will wait for labs before prescribing metformin to make sure you can tolerate this. H/o hemochromatosis:  Labs pending Fatigue:  May be from stress, but we need to rule out heart condition.  Other factors causing fatigue may include elevated blood sugar and elevated iron levels as well as low testosterone.  Awaiting lab results Allergic conjunctivitis:  Acular drops prescribed Hypertension:  Lisinopril refilled.

## 2013-09-21 LAB — PSA: PSA: 0.69 ng/mL (ref <=4.00)

## 2013-09-22 ENCOUNTER — Telehealth: Payer: Self-pay

## 2013-09-22 DIAGNOSIS — E8881 Metabolic syndrome: Secondary | ICD-10-CM

## 2013-09-22 NOTE — Telephone Encounter (Signed)
lmom for pt to cb

## 2013-09-22 NOTE — Telephone Encounter (Signed)
Patient returned call, please call patient back

## 2013-09-22 NOTE — Telephone Encounter (Signed)
Patients daughter wants to know if Dr L can order a stress test instead of having the patient go to see a cardioloist.

## 2013-09-23 ENCOUNTER — Encounter: Payer: Self-pay | Admitting: *Deleted

## 2013-09-25 ENCOUNTER — Encounter: Payer: Self-pay | Admitting: Family Medicine

## 2013-09-26 NOTE — Telephone Encounter (Signed)
Please call patient, let him know I've ordered the stress test.  Please also call [facility] to get it scheduled. Cancel the referral to cardiology.

## 2013-09-27 NOTE — Telephone Encounter (Signed)
Francis Cross has left a message for [facility] to call back and schedule at stress test and cancel the original referral

## 2013-09-28 ENCOUNTER — Encounter: Payer: Self-pay | Admitting: Family Medicine

## 2013-09-28 ENCOUNTER — Other Ambulatory Visit: Payer: Self-pay | Admitting: Family Medicine

## 2013-09-28 DIAGNOSIS — E785 Hyperlipidemia, unspecified: Secondary | ICD-10-CM

## 2013-09-28 MED ORDER — EZETIMIBE 10 MG PO TABS: 10.0000 mg | ORAL_TABLET | Freq: Every day | ORAL | Status: DC

## 2013-09-30 ENCOUNTER — Ambulatory Visit (INDEPENDENT_AMBULATORY_CARE_PROVIDER_SITE_OTHER): Payer: BC Managed Care – PPO | Admitting: Family Medicine

## 2013-09-30 ENCOUNTER — Ambulatory Visit: Payer: BC Managed Care – PPO | Admitting: Cardiovascular Disease

## 2013-09-30 VITALS — BP 118/82 | HR 82 | Temp 100.2°F | Resp 20 | Ht 66.0 in | Wt 203.4 lb

## 2013-09-30 DIAGNOSIS — R059 Cough, unspecified: Secondary | ICD-10-CM

## 2013-09-30 DIAGNOSIS — J029 Acute pharyngitis, unspecified: Secondary | ICD-10-CM

## 2013-09-30 DIAGNOSIS — R509 Fever, unspecified: Secondary | ICD-10-CM

## 2013-09-30 DIAGNOSIS — J209 Acute bronchitis, unspecified: Secondary | ICD-10-CM

## 2013-09-30 DIAGNOSIS — E291 Testicular hypofunction: Secondary | ICD-10-CM

## 2013-09-30 DIAGNOSIS — I10 Essential (primary) hypertension: Secondary | ICD-10-CM

## 2013-09-30 DIAGNOSIS — R05 Cough: Secondary | ICD-10-CM

## 2013-09-30 MED ORDER — AZITHROMYCIN 250 MG PO TABS: ORAL_TABLET | ORAL | Status: DC

## 2013-09-30 MED ORDER — LISINOPRIL-HYDROCHLOROTHIAZIDE 20-12.5 MG PO TABS: 1.0000 | ORAL_TABLET | Freq: Every day | ORAL | Status: DC

## 2013-09-30 MED ORDER — HYDROCODONE-HOMATROPINE 5-1.5 MG/5ML PO SYRP: 5.0000 mL | ORAL_SOLUTION | Freq: Three times a day (TID) | ORAL | Status: DC | PRN

## 2013-09-30 NOTE — Progress Notes (Signed)
Patient ID: Francis Cross MRN: [id number], DOB: 05-21-1955, 58 y.o. Date of Encounter: 09/30/2013, 7:37 PM  Primary Physician: Eliezer Lofts, MD  Chief Complaint:  Chief Complaint  Patient presents with  . Fever  . Generalized Body Aches  . Lab work    Testosterone Level  . Cough    HPI: 58 y.o. year old male presents with a 5 day history of nasal congestion, post nasal drip, sore throat, and cough. Mild sinus pressure. Afebrile.  chills. Nasal congestion thick and green/yellow. Cough is productive of green/yellow sputum and not associated with time of day. Ears feel full, leading to sensation of muffled hearing. Has tried OTC cold preps without success. No GI complaints.   Nasty sore throat as well as fever.  No sick contacts, recent antibiotics, or recent travels.   No leg trauma, sedentary periods, h/o cancer, or tobacco use.  Past Medical History  Diagnosis Date  . Hypertension   . Hiatal hernia   . Acid reflux   . Erectile dysfunction   . Hyperlipidemia   . Gout   . Iron overload   . Obesity      Home Meds: Prior to Admission medications   Medication Sig Start Date End Date Taking? Authorizing Provider  calcipotriene (DOVONOX) 0.005 % cream Apply topically 2 (two) times daily. 06/07/12  Yes [name], MD  ketorolac (ACULAR) 0.5 % ophthalmic solution Place 1 drop into both eyes 2 (two) times daily as needed. 09/20/13  Yes [name], MD  lisinopril-hydrochlorothiazide (PRINZIDE,ZESTORETIC) 20-12.5 MG per tablet Take 1 tablet by mouth daily. 09/20/13  Yes [name], MD  azithromycin (ZITHROMAX Z-PAK) 250 MG tablet Take as directed on pack 09/30/13   [name], MD  ezetimibe (ZETIA) 10 MG tablet Take 1 tablet (10 mg total) by mouth daily. 09/28/13   [name], MD  HYDROcodone-homatropine [facility]) 5-1.5 MG/5ML syrup Take 5 mLs by mouth every 8 (eight) hours as needed for cough. 09/30/13   [name], MD  metFORMIN (GLUCOPHAGE) 500 MG tablet Take  1 tablet (500 mg total) by mouth at bedtime. 09/20/13   [name], MD  Testosterone (ANDROGEL PUMP) 20.25 MG/ACT (1.62%) GEL Place 1 applicator onto the skin Nightly. 07/20/12   [name], MD    Allergies:  Allergies  Allergen Reactions  . Cephalexin     REACTION: Rash  . Penicillins     REACTION: Rash    History   Social History  . Marital Status: Married    Spouse Name: N/A    Number of Children: N/A  . Years of Education: N/A   Occupational History  . Not on file.   Social History Main Topics  . Smoking status: Former Research scientist (life sciences)  . Smokeless tobacco: Former Systems developer  . Alcohol Use: Yes     Comment: occ beer  . Drug Use: No  . Sexual Activity: Not on file   Other Topics Concern  . Not on file   Social History Narrative  . No narrative on file     Review of Systems: Constitutional: positive for chills, fever, night sweats; negative for weight changes Cardiovascular: negative for chest pain or palpitations Respiratory: negative for hemoptysis, wheezing, or shortness of breath Abdominal: negative for abdominal pain, nausea, vomiting or diarrhea Dermatological: negative for rash Neurologic: negative for headache   Physical Exam: Blood pressure 118/82, pulse 82, temperature 100.2 F (37.9 C), temperature source Oral, resp. rate 20, height 5\' 6"  (1.676 m), weight 203 lb 6 oz (92.25 kg),  SpO2 98.00%., Body mass index is 32.84 kg/(m^2). General: Well developed, well nourished, in no acute distress. Head: Normocephalic, atraumatic, eyes without discharge, sclera non-icteric, nares are congested. Bilateral auditory canals clear, TM's are without perforation, pearly grey with reflective cone of light bilaterally. No sinus TTP. Oral cavity moist, dentition normal. Posterior pharynx with post nasal drip and mild erythema. No peritonsillar abscess or tonsillar exudate. Neck: Supple. No thyromegaly. Full ROM. No lymphadenopathy. Lungs: Coarse breath sounds bilaterally with  wheezes, and rhonchi. Breathing is unlabored.  No rales Heart: RRR with S1 S2. No murmurs, rubs, or gallops appreciated. Msk:  Strength and tone normal for age. Extremities: No clubbing or cyanosis. No edema. Neuro: Alert and oriented X 3. Moves all extremities spontaneously. CNII-XII grossly in tact. Psych:  Responds to questions appropriately with a normal affect.    ASSESSMENT AND PLAN:  58 y.o. year old male with bronchitis. Fever, unspecified - Plan: HYDROcodone-homatropine (HYCODAN) 5-1.5 MG/5ML syrup, azithromycin (ZITHROMAX Z-PAK) 250 MG tablet  Cough - Plan: HYDROcodone-homatropine (HYCODAN) 5-1.5 MG/5ML syrup, azithromycin (ZITHROMAX Z-PAK) 250 MG tablet  Sore throat - Plan: HYDROcodone-homatropine (HYCODAN) 5-1.5 MG/5ML syrup, azithromycin (ZITHROMAX Z-PAK) 250 MG tablet  Acute bronchitis, unspecified organism - Plan: HYDROcodone-homatropine (HYCODAN) 5-1.5 MG/5ML syrup, azithromycin (ZITHROMAX Z-PAK) 250 MG tablet  -RTC 3-5 days if no improvement  Signed, [name], MD 09/30/2013 7:37 PM  DOT form completed as well

## 2013-10-01 ENCOUNTER — Ambulatory Visit (INDEPENDENT_AMBULATORY_CARE_PROVIDER_SITE_OTHER): Payer: BC Managed Care – PPO | Admitting: Cardiovascular Disease

## 2013-10-01 ENCOUNTER — Encounter: Payer: Self-pay | Admitting: Cardiovascular Disease

## 2013-10-01 VITALS — BP 108/80 | HR 67 | Ht 66.0 in | Wt 208.0 lb

## 2013-10-01 DIAGNOSIS — R079 Chest pain, unspecified: Secondary | ICD-10-CM

## 2013-10-01 DIAGNOSIS — E785 Hyperlipidemia, unspecified: Secondary | ICD-10-CM

## 2013-10-01 DIAGNOSIS — I10 Essential (primary) hypertension: Secondary | ICD-10-CM

## 2013-10-01 NOTE — Assessment & Plan Note (Signed)
Blood pressure is well controlled on current medications. 

## 2013-10-01 NOTE — Assessment & Plan Note (Signed)
He is intolerant to statins. He had myalgia recently with Crestor and he is about to start taking Zetia.

## 2013-10-01 NOTE — Patient Instructions (Addendum)
[employer]  Your caregiver has ordered a Stress Test with nuclear imaging. The purpose of this test is to evaluate the blood supply to your heart muscle. This procedure is referred to as a "Non-Invasive Stress Test." This is because other than having an IV started in your vein, nothing is inserted or "invades" your body. Cardiac stress tests are done to find areas of poor blood flow to the heart by determining the extent of coronary artery disease (CAD). Some patients exercise on a treadmill, which naturally increases the blood flow to your heart, while others who are  unable to walk on a treadmill due to physical limitations have a pharmacologic/chemical stress agent called Lexiscan . This medicine will mimic walking on a treadmill by temporarily increasing your coronary blood flow.   Please note: these test may take anywhere between 2-4 hours to complete  PLEASE REPORT TO [facility] AT THE FIRST DESK WILL DIRECT YOU WHERE TO GO  Date of Procedure:_________8/5/15____________________________  Arrival Time for Procedure:_______0715 am_______________________  Instructions regarding medication:   _x___ : Hold diabetes medication morning of procedure    PLEASE NOTIFY THE OFFICE AT LEAST 24 HOURS IN ADVANCE IF YOU ARE UNABLE TO KEEP YOUR APPOINTMENT.  [phone] AND  PLEASE NOTIFY NUCLEAR MEDICINE AT [facility] AT LEAST 24 HOURS IN ADVANCE IF YOU ARE UNABLE TO KEEP YOUR APPOINTMENT. [phone]  How to prepare for your Myoview test:  1. Do not eat or drink after midnight 2. No caffeine for 24 hours prior to test 3. No smoking 24 hours prior to test. 4. Your medication may be taken with water.  If your doctor stopped a medication because of this test, do not take that medication. 5. Ladies, please do not wear dresses.  Skirts or pants are appropriate. Please wear a short sleeve shirt. 6. No perfume, cologne or lotion. 7. Wear comfortable walking shoes. No  heels!  Your physician recommends that you schedule a follow-up appointment in:  As needed

## 2013-10-01 NOTE — Progress Notes (Signed)
Primary care physician: Dr. Diona Browner  HPI  This is a pleasant 58 year old male who is here today for evaluation of chest pain. He has no previous cardiac history. He has no history of hyperlipidemia, hypertension and borderline diabetes. He is not a smoker and has no family history of premature coronary artery disease. He had previous cervical fusion. He has been complaining of increased fatigue recently as well as left-sided chest pain described as sharp and dull sensation with no radiation. He occasionally has left fingers numbness but that has not happened at the same time of chest pain. The chest pain is not exertional and is not associated with other symptoms.  Allergies  Allergen Reactions  . Cephalexin     REACTION: Rash  . Penicillins     REACTION: Rash     Current Outpatient Prescriptions on File Prior to Visit  Medication Sig Dispense Refill  . azithromycin (ZITHROMAX Z-PAK) 250 MG tablet Take as directed on pack  6 tablet  0  . calcipotriene (DOVONOX) 0.005 % cream Apply topically 2 (two) times daily.  60 g  6  . HYDROcodone-homatropine (HYCODAN) 5-1.5 MG/5ML syrup Take 5 mLs by mouth every 8 (eight) hours as needed for cough.  120 mL  0  . ketorolac (ACULAR) 0.5 % ophthalmic solution Place 1 drop into both eyes 2 (two) times daily as needed.  5 mL  0  . lisinopril-hydrochlorothiazide (PRINZIDE,ZESTORETIC) 20-12.5 MG per tablet Take 1 tablet by mouth daily.  90 tablet  3  . metFORMIN (GLUCOPHAGE) 500 MG tablet Take 1 tablet (500 mg total) by mouth at bedtime.  90 tablet  3  . Testosterone (ANDROGEL PUMP) 20.25 MG/ACT (1.62%) GEL Place 1 applicator onto the skin Nightly.  75 g  3  . ezetimibe (ZETIA) 10 MG tablet Take 1 tablet (10 mg total) by mouth daily.  90 tablet  3   No current facility-administered medications on file prior to visit.     Past Medical History  Diagnosis Date  . Hypertension   . Hiatal hernia   . Acid reflux   . Erectile dysfunction   .  Hyperlipidemia   . Gout   . Iron overload   . Obesity   . Hemochromatosis      Past Surgical History  Procedure Laterality Date  . Cardiac catheterization      MC  . Appendectomy    . Tonsillectomy    . Cervical fusion       Family History  Problem Relation Age of Onset  . Cancer Father      History   Social History  . Marital Status: Married    Spouse Name: N/A    Number of Children: N/A  . Years of Education: N/A   Occupational History  . Not on file.   Social History Main Topics  . Smoking status: Former Research scientist (life sciences)  . Smokeless tobacco: Former Systems developer  . Alcohol Use: No     Comment: occ beer  . Drug Use: No  . Sexual Activity: Not on file   Other Topics Concern  . Not on file   Social History Narrative  . No narrative on file     ROS A 10 point review of system was performed. It is negative other than that mentioned in the history of present illness.   PHYSICAL EXAM   BP 108/80  Pulse 67  Ht 5\' 6"  (1.676 m)  Wt 208 lb (94.348 kg)  BMI 33.59 kg/m2 Constitutional: He is oriented  to person, place, and time. He appears well-developed and well-nourished. No distress.  HENT: No nasal discharge.  Head: Normocephalic and atraumatic.  Eyes: Pupils are equal and round.  No discharge. Neck: Normal range of motion. Neck supple. No JVD present. No thyromegaly present.  Cardiovascular: Normal rate, regular rhythm, normal heart sounds. Exam reveals no gallop and no friction rub. No murmur heard.  Pulmonary/Chest: Effort normal and breath sounds normal. No stridor. No respiratory distress. He has no wheezes. He has no rales. He exhibits no tenderness.  Abdominal: Soft. Bowel sounds are normal. He exhibits no distension. There is no tenderness. There is no rebound and no guarding.  Musculoskeletal: Normal range of motion. He exhibits no edema and no tenderness.  Neurological: He is alert and oriented to person, place, and time. Coordination normal.  Skin: Skin is  warm and dry. No rash noted. He is not diaphoretic. No erythema. No pallor.  Psychiatric: He has a normal mood and affect. His behavior is normal. Judgment and thought content normal.       SLP:NPYYF  Rhythm  WITHIN NORMAL LIMITS   ASSESSMENT AND PLAN

## 2013-10-01 NOTE — Assessment & Plan Note (Signed)
The chest pain is overall atypical. However, he has multiple risk factors for coronary artery disease and does complain of increased exertional fatigue. Thus, I recommend evaluation with a pharmacologic nuclear stress test. He is not able to exercise on a treadmill due to arthritis.  I discussed with the patient the importance of lifestyle changes in order to decrease the chance of future coronary artery disease and cardiovascular events. We discussed the importance of controlling risk factors, healthy diet as well as regular exercise. I also explained to him that a normal stress test does not rule out atherosclerosis.

## 2013-10-04 LAB — TESTOSTERONE, FREE, TOTAL, SHBG
Sex Hormone Binding: 23 nmol/L (ref 13–71)
Testosterone, Free: 36.7 pg/mL — ABNORMAL LOW (ref 47.0–244.0)
Testosterone-% Free: 2.3 % (ref 1.6–2.9)
Testosterone: 161 ng/dL — ABNORMAL LOW (ref 300–890)

## 2013-10-06 ENCOUNTER — Other Ambulatory Visit: Payer: Self-pay

## 2013-10-06 ENCOUNTER — Ambulatory Visit: Payer: Self-pay | Admitting: Cardiovascular Disease

## 2013-10-06 DIAGNOSIS — R079 Chest pain, unspecified: Secondary | ICD-10-CM

## 2013-10-07 ENCOUNTER — Telehealth: Payer: Self-pay

## 2013-10-07 DIAGNOSIS — E291 Testicular hypofunction: Secondary | ICD-10-CM

## 2013-10-07 DIAGNOSIS — R7989 Other specified abnormal findings of blood chemistry: Secondary | ICD-10-CM

## 2013-10-07 NOTE — Telephone Encounter (Signed)
Pt daughter would like stress test results.

## 2013-10-07 NOTE — Telephone Encounter (Signed)
Pt's daughter wants to know if we can just RF his Androgel or if he really does need to see endo? Please advise. Thanks

## 2013-10-07 NOTE — Telephone Encounter (Signed)
Message copied by Constance Goltz on Thu Oct 07, 2013  3:38 PM ------      Message from: [name]      Created: Fri Oct 01, 2013  7:12 PM       Patient has abnormal lab values.  The testosterone is indeed low.  Let's have patient follow up with endocrinolgist for low T.  Please send in order and I will sign. ------

## 2013-10-08 NOTE — Telephone Encounter (Signed)
See result note.  

## 2013-10-09 NOTE — Telephone Encounter (Signed)
Referral placed. Pt's wife prefers to see Dr. Renato Shin. Can we send in one month RF on the Androgel until he can get in please. Thanks

## 2013-10-09 NOTE — Telephone Encounter (Signed)
I prefer endocrinology referral

## 2013-10-11 MED ORDER — TESTOSTERONE 20.25 MG/ACT (1.62%) TD GEL: 1.0000 | Freq: Every evening | TRANSDERMAL | Status: DC

## 2013-10-11 NOTE — Telephone Encounter (Signed)
Notified pt of RF. Faxed to [facility] per pt req.

## 2013-10-11 NOTE — Telephone Encounter (Signed)
Can you print this for me Chelle. Thanks

## 2013-10-11 NOTE — Telephone Encounter (Signed)
Please call in one month refill on Androgel

## 2013-10-11 NOTE — Telephone Encounter (Signed)
Meds ordered this encounter  Medications  . Testosterone (ANDROGEL PUMP) 20.25 MG/ACT (1.62%) GEL    Sig: Place 1 applicator onto the skin Nightly.    Dispense:  75 g    Refill:  0    Order Specific Question:  Supervising Provider    Answer:  DOOLITTLE, ROBERT P [[id number]]

## 2013-12-17 ENCOUNTER — Other Ambulatory Visit: Payer: Self-pay

## 2014-01-24 ENCOUNTER — Ambulatory Visit: Payer: Self-pay | Admitting: Physician Assistant

## 2014-01-24 ENCOUNTER — Telehealth: Payer: Self-pay | Admitting: Family Medicine

## 2014-01-24 NOTE — Telephone Encounter (Signed)
Noted  
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
## 2014-02-22 ENCOUNTER — Other Ambulatory Visit: Payer: Self-pay | Admitting: Internal Medicine

## 2014-02-22 ENCOUNTER — Other Ambulatory Visit: Payer: Self-pay

## 2014-02-22 DIAGNOSIS — E291 Testicular hypofunction: Secondary | ICD-10-CM

## 2014-02-22 NOTE — Telephone Encounter (Signed)
[facility] sent req for RF of testosterone lipoderm 5% (50 mg/ml) cream, sig apply 1 ml (4 clicks) topically once a day as directed. Pended.

## 2014-02-23 NOTE — Telephone Encounter (Signed)
This is duplicate. Req already forwarded to Dr L yesterday.

## 2014-02-23 NOTE — Telephone Encounter (Signed)
Dr L, do you want to RF?

## 2014-02-24 NOTE — Telephone Encounter (Signed)
Please find where patient got his prescription and refill it for 5 months

## 2014-02-27 NOTE — Telephone Encounter (Signed)
Dr. Joseph Art,  Rx is pended. Please sign in [facility], it will print, and then sign the paper prescription. It can then be faxed to the patient's pharmacy.

## 2014-02-28 MED ORDER — UNABLE TO FIND: Status: DC

## 2014-03-13 ENCOUNTER — Ambulatory Visit (INDEPENDENT_AMBULATORY_CARE_PROVIDER_SITE_OTHER): Payer: BLUE CROSS/BLUE SHIELD | Admitting: Physician Assistant

## 2014-03-13 VITALS — BP 140/82 | HR 92 | Temp 98.7°F | Resp 16 | Ht 66.0 in | Wt 215.0 lb

## 2014-03-13 DIAGNOSIS — E119 Type 2 diabetes mellitus without complications: Secondary | ICD-10-CM

## 2014-03-13 DIAGNOSIS — J029 Acute pharyngitis, unspecified: Secondary | ICD-10-CM

## 2014-03-13 DIAGNOSIS — R0981 Nasal congestion: Secondary | ICD-10-CM

## 2014-03-13 DIAGNOSIS — E291 Testicular hypofunction: Secondary | ICD-10-CM

## 2014-03-13 DIAGNOSIS — J209 Acute bronchitis, unspecified: Secondary | ICD-10-CM

## 2014-03-13 DIAGNOSIS — R7989 Other specified abnormal findings of blood chemistry: Secondary | ICD-10-CM

## 2014-03-13 LAB — HEMOGLOBIN A1C
HEMOGLOBIN A1C: 9 % — AB (ref <5.7)
MEAN PLASMA GLUCOSE: 212 mg/dL — AB (ref <117)

## 2014-03-13 LAB — POCT RAPID STREP A (OFFICE): Rapid Strep A Screen: NEGATIVE

## 2014-03-13 MED ORDER — HYDROCOD POLST-CHLORPHEN POLST 10-8 MG/5ML PO LQCR: 5.0000 mL | Freq: Two times a day (BID) | ORAL | Status: DC | PRN

## 2014-03-13 MED ORDER — AZITHROMYCIN 250 MG PO TABS: ORAL_TABLET | ORAL | Status: DC

## 2014-03-13 MED ORDER — IPRATROPIUM BROMIDE 0.03 % NA SOLN: 2.0000 | Freq: Two times a day (BID) | NASAL | Status: DC

## 2014-03-13 NOTE — Patient Instructions (Signed)
Take antibiotic until finished. Use cough syrup at night for sleep. Use nasal spray twice a day. Return if symptoms are not improving in 7-10 days.  You will get a call to make an appointment with endocrinology.  I will give you a call with your results from today.  Try to check your blood sugar at least daily, in the mornings before you eat. Avoid white foods and sugary drinks - rice, potatoes, bread, pasta, soda, sweet tea, energy drinks.Marland Kitchen

## 2014-03-13 NOTE — Progress Notes (Signed)
Subjective:    Patient ID: Francis Cross, male    DOB: 1955/06/06, 59 y.o.   MRN: [id number]  HPI  This is a 59 year old male with PMH HTN, HLD, DM and ED who is presenting with URI symptoms and wanting his testosterone checked.  URI symptoms: Pt reports cough, nasal congestion and intermittent sore throat for 3 weeks. Cough is productive of green sputum - sputum production has increased in the past week. He reports he has been off and on cold and hot but never checked his temperature. He has taken mucinex and sudafed and helps some. He has experienced some wheezing especially when laying flat. He denies SOB. He states his lower ribs have been hurting from coughing. He is not a smoker and does not have a history of asthma. He denies   Low testosterone: Pt states he was tested positive for low testosterone about 5 months ago. Pt has been using testosterone cream and states it works well for his symptoms. He has more energy and ED has improved. Per Dr. Lenn Cal notes he wanted pt to be seen by endocrinology for this problem. Pt states he did not realize what the referral was for and did not go.  Diabetes: Pt has been on metformin 500 mg QHS for the past 5 months. His A1C was 10.5 at that time. He states he checks his blood glucose most mornings. Prior to the holidays his fasting blood glucose was usually less than 140. Since the holidays he has gone back to eating more unhealthy and drinking sodas and sweet tea. His fasting glucose has been >200 since the holidays.   Review of Systems  Constitutional: Negative for fever and chills.  HENT: Positive for congestion and sore throat.    Patient Active Problem List   Diagnosis Date Noted  . Pain in the chest 10/01/2013  . GOUT, UNSPECIFIED 01/29/2010  . Obesity, unspecified 12/06/2008  . IRON OVERLOAD 08/11/2007  . HIATAL HERNIA WITH REFLUX 07/08/2006  . HYPERLIPIDEMIA 06/12/2006  . HYPERTENSION 06/12/2006  . ERECTILE DYSFUNCTION, 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     Allergies  Allergen Reactions  . Cephalexin     REACTION: Rash  . Penicillins     REACTION: Rash   Patient's social and family history were reviewed.     Objective:   Physical Exam  Constitutional: He is oriented to person, place, and time. He appears well-developed and well-nourished. No distress.  HENT:  Head: Normocephalic and atraumatic.  Right Ear: Hearing, tympanic membrane, external ear and ear canal normal.  Left Ear: Hearing, tympanic membrane, external ear and ear canal normal.  Nose: Nose normal. Right sinus exhibits no maxillary sinus tenderness and no frontal sinus tenderness. Left sinus exhibits no maxillary sinus tenderness and no frontal sinus tenderness.  Mouth/Throat: Uvula is midline and mucous membranes are normal. Posterior oropharyngeal erythema present. No oropharyngeal exudate.  Eyes: Conjunctivae and lids are normal. Right eye exhibits no discharge. Left eye exhibits no discharge. No scleral icterus.  Cardiovascular: Normal rate, regular rhythm, normal heart sounds, intact distal pulses and normal pulses.   No murmur heard. Pulmonary/Chest: Effort normal. No respiratory distress. He has no wheezes. He has no rhonchi. He has no rales.  Musculoskeletal: Normal range of motion.  Lymphadenopathy:    He has no cervical adenopathy.  Neurological: He is alert and oriented to person, place, and time.  Skin: Skin is warm, dry and intact. No lesion and no rash noted.  Psychiatric: He has a normal mood and  affect. His speech is normal and behavior is normal. Thought content normal.   BP 140/82 mmHg  Pulse 92  Temp(Src) 98.7 F (37.1 C) (Oral)  Resp 16  Ht 5\' 6"  (1.676 m)  Wt 215 lb (97.523 kg)  BMI 34.72 kg/m2  SpO2 96%   Results for orders placed or performed in visit on 03/13/14  POCT rapid strep A  Result Value Ref Range   Rapid Strep A Screen Negative Negative      Assessment & Plan:  1. Low testosterone Did not refill testosterone cream. He will  follow up with endocrinology for management. - Testosterone, free, total - Ambulatory referral to Endocrinology  2. Sore throat 3. Nasal congestion 4. Acute bronchitis Will treat bronchitis with abx and cough syrup. Atrovent for nasal congestion. Will return in 7-10 days if symptoms worsen or fail to improve.  - POCT rapid strep A - Culture, Group A Strep - azithromycin (ZITHROMAX) 250 MG tablet; Take 2 tabs PO x 1 dose, then 1 tab PO QD x 4 days  Dispense: 6 tablet; Refill: 0 - chlorpheniramine-HYDROcodone (TUSSIONEX PENNKINETIC ER) 10-8 MG/5ML LQCR; Take 5 mLs by mouth every 12 (twelve) hours as needed for cough (cough).  Dispense: 80 mL; Refill: 0 - ipratropium (ATROVENT) 0.03 % nasal spray; Place 2 sprays into both nostrils 2 (two) times daily.  Dispense: 30 mL; Refill: 0  5. Type 2 diabetes mellitus without complication Last M7J 44.9. He has fallen off the bandwagon with his diet and exercise. He is going to try to get back to his diet and will check FGB daily. A1C pending. If still elevated will increase metformin to 500 mg BID. - Hemoglobin A1c     Benjaman Pott. Drenda Freeze, [facility]  03/13/2014

## 2014-03-14 LAB — TESTOSTERONE, FREE, TOTAL, SHBG
Sex Hormone Binding: 19 nmol/L — ABNORMAL LOW (ref 22–77)
TESTOSTERONE FREE: 82.1 pg/mL (ref 47.0–244.0)
TESTOSTERONE: 314 ng/dL (ref 300–890)
Testosterone-% Free: 2.6 % (ref 1.6–2.9)

## 2014-03-15 LAB — CULTURE, GROUP A STREP: Organism ID, Bacteria: NORMAL

## 2014-03-16 ENCOUNTER — Telehealth: Payer: Self-pay | Admitting: Physician Assistant

## 2014-03-16 NOTE — Telephone Encounter (Signed)
Spoke with pt about A1C of 9.0. It is improved from 10.5 five months ago. He will increase metformin to 500 mg BID. His testosterone is improved. He has an appointment with his endocrinologist in 1 week.

## 2014-04-23 ENCOUNTER — Telehealth: Payer: Self-pay

## 2014-04-23 DIAGNOSIS — E119 Type 2 diabetes mellitus without complications: Secondary | ICD-10-CM

## 2014-04-23 MED ORDER — METFORMIN HCL 500 MG PO TABS: 1000.0000 mg | ORAL_TABLET | Freq: Two times a day (BID) | ORAL | Status: DC

## 2014-04-23 NOTE — Telephone Encounter (Signed)
Ok to send in for pt?

## 2014-04-23 NOTE — Telephone Encounter (Signed)
Patient called in today stating that he saw Dr. Durward Parcel who told him to take more Metformin than Dr. Carlean Jews had told him, Dr Durward Parcel is suppose to be sending over the notes from that appointment but the patient is almost out of his RX and would like for Dr.L to send him in a new one. He takes two 500mg  tablets once in the morning and once at night per Dr. Durward Parcel. He stated that the pharmacy would not refill it because it was to soon. But he only has two tablets left.  He would like to be called back at  [phone] and he uses the [id number] - [place], [place] - [place].

## 2014-04-23 NOTE — Telephone Encounter (Signed)
Meds ordered this encounter  Medications  . metFORMIN (GLUCOPHAGE) 500 MG tablet    Sig: Take 2 tablets (1,000 mg total) by mouth 2 (two) times daily with a meal.    Dispense:  360 tablet    Refill:  3    Order Specific Question:  Supervising Provider    Answer:  DOOLITTLE, ROBERT P [[id number]]

## 2014-04-25 NOTE — Telephone Encounter (Signed)
Notified pt. 

## 2014-05-23 ENCOUNTER — Encounter: Payer: Self-pay | Admitting: Physician Assistant

## 2014-05-23 DIAGNOSIS — E349 Endocrine disorder, unspecified: Secondary | ICD-10-CM | POA: Insufficient documentation

## 2014-07-15 ENCOUNTER — Encounter: Payer: Self-pay | Admitting: Family Medicine

## 2014-08-29 ENCOUNTER — Other Ambulatory Visit: Payer: Self-pay

## 2014-11-17 ENCOUNTER — Encounter: Payer: Self-pay | Admitting: Physician Assistant

## 2014-11-18 ENCOUNTER — Other Ambulatory Visit: Payer: Self-pay | Admitting: Family Medicine

## 2014-11-18 MED ORDER — MELOXICAM 15 MG PO TABS: 15.0000 mg | ORAL_TABLET | Freq: Every day | ORAL | Status: DC

## 2014-11-22 ENCOUNTER — Encounter: Payer: Self-pay | Admitting: Physician Assistant

## 2014-11-22 DIAGNOSIS — I10 Essential (primary) hypertension: Secondary | ICD-10-CM

## 2014-11-23 ENCOUNTER — Other Ambulatory Visit: Payer: Self-pay | Admitting: Family Medicine

## 2014-11-24 NOTE — Telephone Encounter (Signed)
Francis Ricks, do you want to OK a 90 day supply w/message that pt needs OV?

## 2014-11-24 NOTE — Telephone Encounter (Signed)
Francis Cross, pt is requesting 90 day supplies of his meds d/t lower cost. Do you want to OK 90 days w/note that he needs OV for more?

## 2014-11-25 MED ORDER — LISINOPRIL-HYDROCHLOROTHIAZIDE 20-12.5 MG PO TABS: 1.0000 | ORAL_TABLET | Freq: Every day | ORAL | Status: DC

## 2014-11-25 NOTE — Telephone Encounter (Signed)
Mobic refilled with 90 day supply by Dr. Joseph Art on 9/16. 90 day supply lisinopril-HCTZ refilled.  He needs to return for OV BEFORE these meds run out.

## 2014-12-07 NOTE — Telephone Encounter (Signed)
Francis Cross already advised pt on Mychart of need for f/up.

## 2014-12-09 ENCOUNTER — Telehealth: Payer: Self-pay

## 2014-12-09 NOTE — Telephone Encounter (Signed)
-----   Message from Gonzella Lex sent at 12/09/2014 11:59 AM EDT -----   ----- Message -----    From: Tacy Dura    Sent: 12/09/2014  10:06 AM      To: [facility]hea Pink Scheduling Pool  I sent a mychart message to Mr. Acree a few weeks ago and appears he has not seen it. Please make sure he has the meloxicam and lisinopril that was refilled. Also please see if we can schedule a diabetes follow up for him. He is overdue.  Thanks, Bennett Scrape

## 2014-12-14 ENCOUNTER — Telehealth: Payer: Self-pay | Admitting: Family Medicine

## 2014-12-14 NOTE — Telephone Encounter (Signed)
lmom to call us to set up a f/u visit of DM with Bush and to let us know is he had picked up his RX for lisinopril and meloxicam

## 2014-12-15 ENCOUNTER — Telehealth: Payer: Self-pay

## 2014-12-15 ENCOUNTER — Ambulatory Visit (INDEPENDENT_AMBULATORY_CARE_PROVIDER_SITE_OTHER): Payer: BLUE CROSS/BLUE SHIELD | Admitting: Family Medicine

## 2014-12-15 VITALS — BP 134/88 | HR 70 | Temp 98.0°F | Resp 16 | Ht 66.0 in | Wt 207.6 lb

## 2014-12-15 DIAGNOSIS — E291 Testicular hypofunction: Secondary | ICD-10-CM | POA: Diagnosis not present

## 2014-12-15 DIAGNOSIS — E1165 Type 2 diabetes mellitus with hyperglycemia: Secondary | ICD-10-CM

## 2014-12-15 DIAGNOSIS — IMO0001 Reserved for inherently not codable concepts without codable children: Secondary | ICD-10-CM

## 2014-12-15 DIAGNOSIS — I10 Essential (primary) hypertension: Secondary | ICD-10-CM | POA: Diagnosis not present

## 2014-12-15 DIAGNOSIS — H9193 Unspecified hearing loss, bilateral: Secondary | ICD-10-CM

## 2014-12-15 DIAGNOSIS — Z0000 Encounter for general adult medical examination without abnormal findings: Secondary | ICD-10-CM

## 2014-12-15 DIAGNOSIS — L210 Seborrhea capitis: Secondary | ICD-10-CM

## 2014-12-15 DIAGNOSIS — M545 Low back pain, unspecified: Secondary | ICD-10-CM

## 2014-12-15 DIAGNOSIS — G8929 Other chronic pain: Secondary | ICD-10-CM | POA: Diagnosis not present

## 2014-12-15 DIAGNOSIS — H1013 Acute atopic conjunctivitis, bilateral: Secondary | ICD-10-CM

## 2014-12-15 DIAGNOSIS — R197 Diarrhea, unspecified: Secondary | ICD-10-CM | POA: Diagnosis not present

## 2014-12-15 LAB — HEMOGLOBIN A1C: Hgb A1c MFr Bld: 6.7 % — AB (ref 4.0–6.0)

## 2014-12-15 LAB — VITAMIN B12: Vitamin B-12: 1717 pg/mL — ABNORMAL HIGH (ref 211–911)

## 2014-12-15 LAB — POCT URINALYSIS DIP (MANUAL ENTRY)
Bilirubin, UA: NEGATIVE
Blood, UA: NEGATIVE
Glucose, UA: NEGATIVE
Ketones, POC UA: NEGATIVE
Leukocytes, UA: NEGATIVE
Nitrite, UA: NEGATIVE
Protein Ur, POC: NEGATIVE
Spec Grav, UA: 1.02
Urobilinogen, UA: 0.2
pH, UA: 5.5

## 2014-12-15 LAB — POCT CBC
Granulocyte percent: 62 %G (ref 37–80)
HCT, POC: 46.8 % (ref 43.5–53.7)
Hemoglobin: 16.3 g/dL (ref 14.1–18.1)
Lymph, poc: 2 (ref 0.6–3.4)
MCH, POC: 30.8 pg (ref 27–31.2)
MCHC: 34.8 g/dL (ref 31.8–35.4)
MCV: 86.8 fL (ref 80–97)
MID (cbc): 0.5 (ref 0–0.9)
MPV: 8.2 fL (ref 0–99.8)
POC Granulocyte: 4 (ref 2–6.9)
POC LYMPH PERCENT: 30.7 %L (ref 10–50)
POC MID %: 7.3 %M (ref 0–12)
Platelet Count, POC: 189 10*3/uL (ref 142–424)
RBC: 5.29 M/uL (ref 4.69–6.13)
RDW, POC: 13.9 %
WBC: 6.5 10*3/uL (ref 4.6–10.2)

## 2014-12-15 LAB — POCT GLYCOSYLATED HEMOGLOBIN (HGB A1C): Hemoglobin A1C: 6.7

## 2014-12-15 LAB — VITAMIN D 25 HYDROXY (VIT D DEFICIENCY, FRACTURES): Vit D, 25-Hydroxy: 39 ng/mL (ref 30–100)

## 2014-12-15 LAB — FERRITIN: Ferritin: 157 ng/mL (ref 22–322)

## 2014-12-15 LAB — TSH: TSH: 1.617 u[IU]/mL (ref 0.350–4.500)

## 2014-12-15 MED ORDER — EMPAGLIFLOZIN 10 MG PO TABS: 10.0000 mg | ORAL_TABLET | Freq: Every day | ORAL | Status: DC

## 2014-12-15 MED ORDER — LISINOPRIL-HYDROCHLOROTHIAZIDE 20-12.5 MG PO TABS: 1.0000 | ORAL_TABLET | Freq: Every day | ORAL | Status: DC

## 2014-12-15 MED ORDER — KETOCONAZOLE 2 % EX SHAM: 1.0000 "application " | MEDICATED_SHAMPOO | CUTANEOUS | Status: DC

## 2014-12-15 MED ORDER — KETOROLAC TROMETHAMINE 0.5 % OP SOLN: 1.0000 [drp] | Freq: Two times a day (BID) | OPHTHALMIC | Status: DC | PRN

## 2014-12-15 MED ORDER — MELOXICAM 15 MG PO TABS
15.0000 mg | ORAL_TABLET | Freq: Every day | ORAL | Status: DC
Start: 2014-12-15 — End: 2015-10-11

## 2014-12-15 MED ORDER — TESTOSTERONE 20.25 MG/ACT (1.62%) TD GEL: 1.0000 | Freq: Every evening | TRANSDERMAL | Status: DC

## 2014-12-15 NOTE — Telephone Encounter (Signed)
Patients daughter is calling because she would like to know if patient could receive a 90 day supply of jardiance instead of 30. Also patietnt needs [facility]. I was originally sent to [facility]

## 2014-12-15 NOTE — Telephone Encounter (Signed)
I faxed the Testosterone to [facility] this morning [facility] Jardiance for 90 d supply

## 2014-12-15 NOTE — Addendum Note (Signed)
Addended by: Frutoso Chase A on: 12/15/2014 11:16 AM   Modules accepted: Orders
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
## 2014-12-15 NOTE — Patient Instructions (Signed)
Providers here that would be good:  Janeann Forehand, Zetta Bills    Health Maintenance, Male A healthy lifestyle and preventative care can promote health and wellness.  Maintain regular health, dental, and eye exams.  Eat a healthy diet. Foods like vegetables, fruits, whole grains, low-fat dairy products, and lean protein foods contain the nutrients you need and are low in calories. Decrease your intake of foods high in solid fats, added sugars, and salt. Get information about a proper diet from your health care provider, if necessary.  Regular physical exercise is one of the most important things you can do for your health. Most adults should get at least 150 minutes of moderate-intensity exercise (any activity that increases your heart rate and causes you to sweat) each week. In addition, most adults need muscle-strengthening exercises on 2 or more days a week.   Maintain a healthy weight. The body mass index (BMI) is a screening tool to identify possible weight problems. It provides an estimate of body fat based on height and weight. Your health care provider can find your BMI and can help you achieve or maintain a healthy weight. For males 20 years and older:  A BMI below 18.5 is considered underweight.  A BMI of 18.5 to 24.9 is normal.  A BMI of 25 to 29.9 is considered overweight.  A BMI of 30 and above is considered obese.  Maintain normal blood lipids and cholesterol by exercising and minimizing your intake of saturated fat. Eat a balanced diet with plenty of fruits and vegetables. Blood tests for lipids and cholesterol should begin at age 16 and be repeated every 5 years. If your lipid or cholesterol levels are high, you are over age 53, or you are at high risk for heart disease, you may need your cholesterol levels checked more frequently.Ongoing high lipid and cholesterol levels should be treated with medicines if diet and exercise are not working.  If you smoke, find out  from your health care provider how to quit. If you do not use tobacco, do not start.  Lung cancer screening is recommended for adults aged 65-80 years who are at high risk for developing lung cancer because of a history of smoking. A yearly low-dose CT scan of the lungs is recommended for people who have at least a 30-pack-year history of smoking and are current smokers or have quit within the past 15 years. A pack year of smoking is smoking an average of 1 pack of cigarettes a day for 1 year (for example, a 30-pack-year history of smoking could mean smoking 1 pack a day for 30 years or 2 packs a day for 15 years). Yearly screening should continue until the smoker has stopped smoking for at least 15 years. Yearly screening should be stopped for people who develop a health problem that would prevent them from having lung cancer treatment.  If you choose to drink alcohol, do not have more than 2 drinks per day. One drink is considered to be 12 oz (360 mL) of beer, 5 oz (150 mL) of wine, or 1.5 oz (45 mL) of liquor.  Avoid the use of street drugs. Do not share needles with anyone. Ask for help if you need support or instructions about stopping the use of drugs.  High blood pressure causes heart disease and increases the risk of stroke. High blood pressure is more likely to develop in:  People who have blood pressure in the end of the normal range (100-139/85-89  mm Hg).  People who are overweight or obese.  People who are African American.  If you are 34-44 years of age, have your blood pressure checked every 3-5 years. If you are 31 years of age or older, have your blood pressure checked every year. You should have your blood pressure measured twice--once when you are at a hospital or clinic, and once when you are not at a hospital or clinic. Record the average of the two measurements. To check your blood pressure when you are not at a hospital or clinic, you can use:  An automated blood pressure  machine at a pharmacy.  A home blood pressure monitor.  If you are 45-36 years old, ask your health care provider if you should take aspirin to prevent heart disease.  Diabetes screening involves taking a blood sample to check your fasting blood sugar level. This should be done once every 3 years after age 41 if you are at a normal weight and without risk factors for diabetes. Testing should be considered at a younger age or be carried out more frequently if you are overweight and have at least 1 risk factor for diabetes.  Colorectal cancer can be detected and often prevented. Most routine colorectal cancer screening begins at the age of 8 and continues through age 72. However, your health care provider may recommend screening at an earlier age if you have risk factors for colon cancer. On a yearly basis, your health care provider may provide home test kits to check for hidden blood in the stool. A small camera at the end of a tube may be used to directly examine the colon (sigmoidoscopy or colonoscopy) to detect the earliest forms of colorectal cancer. Talk to your health care provider about this at age 26 when routine screening begins. A direct exam of the colon should be repeated every 5-10 years through age 71, unless early forms of precancerous polyps or small growths are found.  People who are at an increased risk for hepatitis B should be screened for this virus. You are considered at high risk for hepatitis B if:  You were born in a country where hepatitis B occurs often. Talk with your health care provider about which countries are considered high risk.  Your parents were born in a high-risk country and you have not received a shot to protect against hepatitis B (hepatitis B vaccine).  You have HIV or AIDS.  You use needles to inject street drugs.  You live with, or have sex with, someone who has hepatitis B.  You are a man who has sex with other men (MSM).  You get hemodialysis  treatment.  You take certain medicines for conditions like cancer, organ transplantation, and autoimmune conditions.  Hepatitis C blood testing is recommended for all people born from [birth year] through 1964 and any individual with known risk factors for hepatitis C.  Healthy men should no longer receive prostate-specific antigen (PSA) blood tests as part of routine cancer screening. Talk to your health care provider about prostate cancer screening.  Testicular cancer screening is not recommended for adolescents or adult males who have no symptoms. Screening includes self-exam, a health care provider exam, and other screening tests. Consult with your health care provider about any symptoms you have or any concerns you have about testicular cancer.  Practice safe sex. Use condoms and avoid high-risk sexual practices to reduce the spread of sexually transmitted infections (STIs).  You should be screened for STIs, including gonorrhea  and chlamydia if:  You are sexually active and are younger than 24 years.  You are older than 24 years, and your health care provider tells you that you are at risk for this type of infection.  Your sexual activity has changed since you were last screened, and you are at an increased risk for chlamydia or gonorrhea. Ask your health care provider if you are at risk.  If you are at risk of being infected with HIV, it is recommended that you take a prescription medicine daily to prevent HIV infection. This is called pre-exposure prophylaxis (PrEP). You are considered at risk if:  You are a man who has sex with other men (MSM).  You are a heterosexual man who is sexually active with multiple partners.  You take drugs by injection.  You are sexually active with a partner who has HIV.  Talk with your health care provider about whether you are at high risk of being infected with HIV. If you choose to begin PrEP, you should first be tested for HIV. You should then be tested  every 3 months for as long as you are taking PrEP.  Use sunscreen. Apply sunscreen liberally and repeatedly throughout the day. You should seek shade when your shadow is shorter than you. Protect yourself by wearing long sleeves, pants, a wide-brimmed hat, and sunglasses year round whenever you are outdoors.  Tell your health care provider of new moles or changes in moles, especially if there is a change in shape or color. Also, tell your health care provider if a mole is larger than the size of a pencil eraser.  A one-time screening for abdominal aortic aneurysm (AAA) and surgical repair of large AAAs by ultrasound is recommended for men aged 8-75 years who are current or former smokers.  Stay current with your vaccines (immunizations).   This information is not intended to replace advice given to you by your health care provider. Make sure you discuss any questions you have with your health care provider.   Document Released: 08/17/2007 Document Revised: 03/11/2014 Document Reviewed: 07/16/2010 Elsevier Interactive Patient Education [employer].

## 2014-12-16 ENCOUNTER — Encounter: Payer: Self-pay | Admitting: Physician Assistant

## 2014-12-16 ENCOUNTER — Other Ambulatory Visit: Payer: Self-pay | Admitting: Family Medicine

## 2014-12-16 DIAGNOSIS — E291 Testicular hypofunction: Secondary | ICD-10-CM

## 2014-12-16 LAB — COMPLETE METABOLIC PANEL WITH GFR
ALT: 77 U/L — ABNORMAL HIGH (ref 9–46)
AST: 48 U/L — ABNORMAL HIGH (ref 10–35)
Albumin: 5.1 g/dL (ref 3.6–5.1)
Alkaline Phosphatase: 70 U/L (ref 40–115)
BUN: 17 mg/dL (ref 7–25)
CO2: 30 mmol/L (ref 20–31)
Calcium: 10 mg/dL (ref 8.6–10.3)
Chloride: 98 mmol/L (ref 98–110)
Creat: 1.01 mg/dL (ref 0.70–1.33)
GFR, Est African American: >89 mL/min (ref >=60)
GFR, Est Non African American: 82 mL/min (ref >=60)
Glucose, Bld: 162 mg/dL — ABNORMAL HIGH (ref 65–99)
Potassium: 4 mmol/L (ref 3.5–5.3)
Sodium: 138 mmol/L (ref 135–146)
Total Bilirubin: 0.7 mg/dL (ref 0.2–1.2)
Total Protein: 7.6 g/dL (ref 6.1–8.1)

## 2014-12-16 LAB — IBC PANEL
%SAT: 34 % (ref 15–60)
TIBC: 399 ug/dL (ref 250–425)
UIBC: 264 ug/dL (ref 125–400)

## 2014-12-16 LAB — IRON: Iron: 135 ug/dL (ref 50–180)

## 2014-12-16 LAB — TESTOSTERONE, FREE, TOTAL, SHBG
Sex Hormone Binding: 23 nmol/L (ref 22–77)
Testosterone, Free: 60.8 pg/mL (ref 47.0–244.0)
Testosterone-% Free: 2.4 % (ref 1.6–2.9)
Testosterone: 258 ng/dL — ABNORMAL LOW (ref 300–890)

## 2014-12-16 LAB — LIPID PANEL
Cholesterol: 279 mg/dL — ABNORMAL HIGH (ref 125–200)
HDL: 52 mg/dL (ref >=40)
LDL Cholesterol: 204 mg/dL — ABNORMAL HIGH (ref <130)
Total CHOL/HDL Ratio: 5.4 Ratio — ABNORMAL HIGH (ref <=5.0)
Triglycerides: 117 mg/dL (ref <150)
VLDL: 23 mg/dL (ref <30)

## 2014-12-16 MED ORDER — TESTOSTERONE 20.25 MG/ACT (1.62%) TD GEL: 2.0000 | Freq: Every evening | TRANSDERMAL | Status: DC

## 2014-12-19 ENCOUNTER — Other Ambulatory Visit: Payer: Self-pay

## 2014-12-19 NOTE — Telephone Encounter (Signed)
See notes under lab results. Dr L wrote new testosterone Rx to inc dosage. Faxed to [place] new dose 2 appl Qhs.

## 2014-12-20 ENCOUNTER — Telehealth: Payer: Self-pay | Admitting: *Deleted

## 2014-12-20 NOTE — Telephone Encounter (Signed)
   [facility] called Francis Cross wants to continue his testosterone 5% not does not want to do the Androgel Pump  [phone] call if this is ok.

## 2014-12-21 ENCOUNTER — Other Ambulatory Visit: Payer: Self-pay | Admitting: Family Medicine

## 2014-12-21 MED ORDER — TESTOSTERONE 30 MG/ACT TD SOLN: 1.0000 "application " | Freq: Every morning | TRANSDERMAL | Status: DC

## 2014-12-21 NOTE — Telephone Encounter (Signed)
Dr L sent in a Rx for testosterone to [facility].

## 2014-12-21 NOTE — Telephone Encounter (Signed)
Faxed

## 2014-12-27 NOTE — Telephone Encounter (Signed)
Patient should receive 75 mg daily of the testosteronje

## 2014-12-27 NOTE — Telephone Encounter (Signed)
Received mes of failed fax and called [place]. Missy reported that pt has been getting a 5% cream compounded to use 1 application QD for total of 50 mg daily. The new Rx called in on 10/17 that was intended to increase the dose was for the Androgel which is 25 mg per application, with directions 2 applications QD. This would just maintain the dose pt has been using. Dr L, how many mg do you want pt to use daily if he had been using 50 mg QD and his testosterone level was low?

## 2014-12-30 ENCOUNTER — Encounter: Payer: Self-pay | Admitting: Family Medicine

## 2015-01-06 ENCOUNTER — Encounter: Payer: Self-pay | Admitting: Gastroenterology

## 2015-01-30 ENCOUNTER — Ambulatory Visit (INDEPENDENT_AMBULATORY_CARE_PROVIDER_SITE_OTHER): Payer: BLUE CROSS/BLUE SHIELD | Admitting: Family Medicine

## 2015-01-30 VITALS — BP 138/90 | HR 72 | Temp 98.3°F | Resp 18 | Ht 67.0 in | Wt 207.8 lb

## 2015-01-30 DIAGNOSIS — R209 Unspecified disturbances of skin sensation: Secondary | ICD-10-CM | POA: Diagnosis not present

## 2015-01-30 DIAGNOSIS — R300 Dysuria: Secondary | ICD-10-CM | POA: Diagnosis not present

## 2015-01-30 DIAGNOSIS — IMO0001 Reserved for inherently not codable concepts without codable children: Secondary | ICD-10-CM

## 2015-01-30 LAB — POCT URINALYSIS DIP (MANUAL ENTRY)
Bilirubin, UA: NEGATIVE
Blood, UA: NEGATIVE
Glucose, UA: 250 — AB
Ketones, POC UA: NEGATIVE
Nitrite, UA: NEGATIVE
Protein Ur, POC: NEGATIVE
Spec Grav, UA: <=1.005
Urobilinogen, UA: 0.2
pH, UA: 5.5

## 2015-01-30 LAB — COMPLETE METABOLIC PANEL WITH GFR
ALT: 38 U/L (ref 9–46)
AST: 30 U/L (ref 10–35)
Albumin: 4.7 g/dL (ref 3.6–5.1)
Alkaline Phosphatase: 67 U/L (ref 40–115)
BUN: 16 mg/dL (ref 7–25)
CO2: 32 mmol/L — ABNORMAL HIGH (ref 20–31)
Calcium: 10.1 mg/dL (ref 8.6–10.3)
Chloride: 98 mmol/L (ref 98–110)
Creat: 1.22 mg/dL (ref 0.70–1.33)
GFR, Est African American: 75 mL/min (ref >=60)
GFR, Est Non African American: 65 mL/min (ref >=60)
Glucose, Bld: 132 mg/dL — ABNORMAL HIGH (ref 65–99)
Potassium: 4.2 mmol/L (ref 3.5–5.3)
Sodium: 137 mmol/L (ref 135–146)
Total Bilirubin: 0.5 mg/dL (ref 0.2–1.2)
Total Protein: 6.8 g/dL (ref 6.1–8.1)

## 2015-01-30 LAB — POC MICROSCOPIC URINALYSIS (UMFC): Mucus: ABSENT

## 2015-01-30 MED ORDER — NYSTATIN 100000 UNIT/GM EX CREA: 1.0000 "application " | TOPICAL_CREAM | Freq: Two times a day (BID) | CUTANEOUS | Status: DC

## 2015-01-30 MED ORDER — FLUCONAZOLE 150 MG PO TABS: 150.0000 mg | ORAL_TABLET | Freq: Once | ORAL | Status: DC

## 2015-01-30 NOTE — Progress Notes (Signed)
@[facility] @  This chart was scribed for Francis Haber, MD by Thea Alken, ED Scribe. This patient was seen in room 13 and the patient's care was started at 4:46 PM.  Patient ID: Francis Cross MRN: [id number], DOB: 30-May-1955, 59 y.o. Date of Encounter: 01/30/2015, 4:43 PM  Primary Physician: Eliezer Lofts, MD  Chief Complaint:  Chief Complaint  Patient presents with  . Urinary Tract Infection    HPI: 59 y.o. year old male with history below presents with possible UTI. Pt states symptoms started 1 week ago with penile irritation, itching. symtpms worsened witn dysuria, urinary frequency and paraesthesia in hands and feet. He took some OTC medication without relief to symptoms. Otherwise patient is doing well.    Past Medical History  Diagnosis Date  . Hypertension   . Hiatal hernia   . Acid reflux   . Erectile dysfunction   . Hyperlipidemia   . Gout   . Iron overload   . Obesity   . Hemochromatosis   . Diabetes mellitus without complication ([place])      Home Meds: Prior to Admission medications   Medication Sig Start Date End Date Taking? Authorizing Provider  calcipotriene (DOVONOX) 0.005 % cream Apply topically 2 (two) times daily. 06/07/12  Yes Francis Haber, MD  empagliflozin (JARDIANCE) 10 MG TABS tablet Take 10 mg by mouth daily. 12/15/14  Yes Francis Haber, MD  ketoconazole (NIZORAL) 2 % shampoo Apply 1 application topically 2 (two) times a week. 12/15/14  Yes Francis Haber, MD  ketorolac (ACULAR) 0.5 % ophthalmic solution Place 1 drop into both eyes 2 (two) times daily as needed. 12/15/14  Yes Francis Haber, MD  lisinopril-hydrochlorothiazide (PRINZIDE,ZESTORETIC) 20-12.5 MG tablet Take 1 tablet by mouth daily. 12/15/14  Yes Francis Haber, MD  meloxicam (MOBIC) 15 MG tablet Take 1 tablet (15 mg total) by mouth daily. 12/15/14  Yes Francis Haber, MD  Testosterone 30 MG/ACT SOLN Place 1 application onto the skin every morning. 12/21/14  Yes Francis Haber, MD   UNABLE TO FIND TESTOSTERONE LIPODERM 5% (50 MG/ML) CREAM. Apply 1 ml (4 clicks) topically once a day as directed. 02/28/14  Yes Francis Haber, MD  aspirin 81 MG tablet Take 81 mg by mouth daily.    Historical Provider, MD    Allergies:  Allergies  Allergen Reactions  . Cephalexin     REACTION: Rash  . Penicillins     REACTION: Rash    Social History   Social History  . Marital Status: Married    Spouse Name: N/A  . Number of Children: N/A  . Years of Education: N/A   Occupational History  . Not on file.   Social History Main Topics  . Smoking status: Former Research scientist (life sciences)  . Smokeless tobacco: Former Systems developer  . Alcohol Use: No     Comment: occ beer  . Drug Use: No  . Sexual Activity: Not on file   Other Topics Concern  . Not on file   Social History Narrative     Review of Systems: Constitutional: negative for chills, fever, night sweats, weight changes, or fatigue  HEENT: negative for vision changes, hearing loss, congestion, rhinorrhea, ST, epistaxis, or sinus pressure Cardiovascular: negative for chest pain or palpitations Respiratory: negative for hemoptysis, wheezing, shortness of breath, or cough Abdominal: negative for abdominal pain, nausea, vomiting, diarrhea, or constipation Dermatological: negative for rash Neurologic: negative for headache, dizziness, or syncope All other systems reviewed and are otherwise negative with the exception to those above and in the HPI.  Physical Exam: Blood pressure 138/90, pulse 72, temperature 98.3 F (36.8 C), temperature source Oral, resp. rate 18, height 5\' 7"  (1.702 m), weight 207 lb 12.8 oz (94.257 kg), SpO2 98 %., Body mass index is 32.54 kg/(m^2). General: Well developed, well nourished, in no acute distress. Head: Normocephalic, atraumatic, eyes without discharge, sclera non-icteric, nares are without discharge. Bilateral auditory canals clear, TM's are without perforation, pearly grey. Oral cavity moist  Neck: Supple.  No thyromegaly. Full ROM. Lungs: Clear bilaterally to auscultation without wheezes, rales, or rhonchi. Breathing is unlabored. Heart: RRR with S1 S2. No murmurs, rubs, or gallops appreciated. Msk:  Strength and tone normal for age. Extremities/Skin: Warm and dry. No clubbing or cyanosis. No edema. No rashes or suspicious lesions. Neuro: Alert and oriented X 3. Moves all extremities spontaneously. Gait is normal. CNII-XII grossly in tact. Psych:  Responds to questions appropriately with a normal affect.  Penis shows moderate erythema diffusely over the corona and foreskin with also having increased erythema at the meatus Labs:..   Results for orders placed or performed in visit on 01/30/15  POCT urinalysis dipstick  Result Value Ref Range   Color, UA yellow yellow   Clarity, UA clear clear   Glucose, UA =250 (A) negative   Bilirubin, UA negative negative   Ketones, POC UA negative negative   Spec Grav, UA <=1.005    Blood, UA negative negative   pH, UA 5.5    Protein Ur, POC negative negative   Urobilinogen, UA 0.2    Nitrite, UA Negative Negative   Leukocytes, UA Trace (A) Negative  POCT Microscopic Urinalysis ([facility])  Result Value Ref Range   WBC,UR,HPF,POC Few (A) None WBC/hpf   RBC,UR,HPF,POC None None RBC/hpf   Bacteria None None, Too numerous to count   Mucus Absent Absent   Epithelial Cells, UR Per Microscopy Few (A) None, Too numerous to count cells/hpf       ASSESSMENT AND PLAN:  59 y.o. year old male with  This chart was scribed in my presence and reviewed by me personally.    ICD-9-CM ICD-10-CM   1. Dysuria 788.1 R30.0 POCT urinalysis dipstick     POCT Microscopic Urinalysis ([facility])     fluconazole (DIFLUCAN) 150 MG tablet     nystatin cream (MYCOSTATIN)  2. Paresthesias/numbness 782.0 [id number] COMPLETE METABOLIC PANEL WITH GFR     Signed, Francis Haber, MD   By signing my name below, I, Raven Small, attest that this documentation has been prepared under  the direction and in the presence of Francis Haber, MD.  Electronically Signed: Thea Alken, ED Scribe. 01/30/2015. 4:48 PM.   Signed, Francis Haber, MD 01/30/2015 4:43 PM

## 2015-02-16 ENCOUNTER — Other Ambulatory Visit: Payer: Self-pay

## 2015-02-16 DIAGNOSIS — IMO0001 Reserved for inherently not codable concepts without codable children: Secondary | ICD-10-CM

## 2015-02-16 DIAGNOSIS — E1165 Type 2 diabetes mellitus with hyperglycemia: Principal | ICD-10-CM

## 2015-02-16 MED ORDER — EMPAGLIFLOZIN 10 MG PO TABS: 10.0000 mg | ORAL_TABLET | Freq: Every day | ORAL | Status: DC

## 2015-02-20 ENCOUNTER — Other Ambulatory Visit: Payer: Self-pay | Admitting: Family Medicine

## 2015-03-29 ENCOUNTER — Other Ambulatory Visit: Payer: Self-pay | Admitting: Internal Medicine

## 2015-03-30 NOTE — Telephone Encounter (Signed)
Faxed

## 2015-04-30 ENCOUNTER — Other Ambulatory Visit: Payer: Self-pay | Admitting: Family Medicine

## 2015-05-28 ENCOUNTER — Other Ambulatory Visit: Payer: Self-pay | Admitting: Family Medicine

## 2015-07-09 ENCOUNTER — Other Ambulatory Visit: Payer: Self-pay | Admitting: Family Medicine

## 2015-07-31 ENCOUNTER — Other Ambulatory Visit: Payer: Self-pay | Admitting: Family Medicine

## 2015-08-02 ENCOUNTER — Telehealth: Payer: Self-pay

## 2015-08-02 NOTE — Telephone Encounter (Signed)
Called to discuss f/up with pt. He will call back next week to see if he can set up appt. Pt has enough med until then, and we will try to get him RF at that time to cover him until appt.

## 2015-08-02 NOTE — Telephone Encounter (Signed)
Pt daughter made an appointment for dad with [facility] on 7/20 for a CPE. His BP meds will run out before then but they will need them to be refilled before he is seen again for his CPE.  Please advise  [phone]

## 2015-08-03 MED ORDER — LISINOPRIL-HYDROCHLOROTHIAZIDE 20-12.5 MG PO TABS: 1.0000 | ORAL_TABLET | Freq: Every day | ORAL | Status: DC

## 2015-08-03 NOTE — Telephone Encounter (Signed)
Rx sent 

## 2015-09-01 ENCOUNTER — Other Ambulatory Visit: Payer: Self-pay | Admitting: Family Medicine

## 2015-09-21 ENCOUNTER — Ambulatory Visit (INDEPENDENT_AMBULATORY_CARE_PROVIDER_SITE_OTHER): Payer: BLUE CROSS/BLUE SHIELD | Admitting: Family Medicine

## 2015-09-21 ENCOUNTER — Encounter: Payer: Self-pay | Admitting: Family Medicine

## 2015-09-21 VITALS — BP 120/80 | HR 67 | Temp 97.9°F | Resp 18 | Ht 67.0 in | Wt 205.0 lb

## 2015-09-21 DIAGNOSIS — M79641 Pain in right hand: Secondary | ICD-10-CM | POA: Diagnosis not present

## 2015-09-21 DIAGNOSIS — H1013 Acute atopic conjunctivitis, bilateral: Secondary | ICD-10-CM

## 2015-09-21 DIAGNOSIS — Z8739 Personal history of other diseases of the musculoskeletal system and connective tissue: Secondary | ICD-10-CM

## 2015-09-21 DIAGNOSIS — Z23 Encounter for immunization: Secondary | ICD-10-CM | POA: Diagnosis not present

## 2015-09-21 DIAGNOSIS — G6289 Other specified polyneuropathies: Secondary | ICD-10-CM

## 2015-09-21 DIAGNOSIS — Z13220 Encounter for screening for lipoid disorders: Secondary | ICD-10-CM | POA: Diagnosis not present

## 2015-09-21 DIAGNOSIS — Z0000 Encounter for general adult medical examination without abnormal findings: Secondary | ICD-10-CM | POA: Diagnosis not present

## 2015-09-21 DIAGNOSIS — E1149 Type 2 diabetes mellitus with other diabetic neurological complication: Secondary | ICD-10-CM | POA: Diagnosis not present

## 2015-09-21 DIAGNOSIS — Z8639 Personal history of other endocrine, nutritional and metabolic disease: Secondary | ICD-10-CM | POA: Diagnosis not present

## 2015-09-21 DIAGNOSIS — H1010 Acute atopic conjunctivitis, unspecified eye: Secondary | ICD-10-CM

## 2015-09-21 DIAGNOSIS — Z79899 Other long term (current) drug therapy: Secondary | ICD-10-CM

## 2015-09-21 DIAGNOSIS — E291 Testicular hypofunction: Secondary | ICD-10-CM

## 2015-09-21 DIAGNOSIS — E1140 Type 2 diabetes mellitus with diabetic neuropathy, unspecified: Secondary | ICD-10-CM

## 2015-09-21 DIAGNOSIS — R7989 Other specified abnormal findings of blood chemistry: Secondary | ICD-10-CM

## 2015-09-21 LAB — CBC WITH DIFFERENTIAL/PLATELET
BASOS PCT: 1 %
Basophils Absolute: 62 cells/uL (ref 0–200)
EOS ABS: 186 {cells}/uL (ref 15–500)
EOS PCT: 3 %
HCT: 50.9 % — ABNORMAL HIGH (ref 38.5–50.0)
Hemoglobin: 17.4 g/dL — ABNORMAL HIGH (ref 13.2–17.1)
LYMPHS PCT: 27 %
Lymphs Abs: 1674 cells/uL (ref 850–3900)
MCH: 31.3 pg (ref 27.0–33.0)
MCHC: 34.2 g/dL (ref 32.0–36.0)
MCV: 91.5 fL (ref 80.0–100.0)
MONOS PCT: 7 %
MPV: 11 fL (ref 7.5–12.5)
Monocytes Absolute: 434 cells/uL (ref 200–950)
NEUTROS ABS: 3844 {cells}/uL (ref 1500–7800)
Neutrophils Relative %: 62 %
PLATELETS: 171 10*3/uL (ref 140–400)
RBC: 5.56 MIL/uL (ref 4.20–5.80)
RDW: 13.3 % (ref 11.0–15.0)
WBC: 6.2 10*3/uL (ref 3.8–10.8)

## 2015-09-21 LAB — COMPLETE METABOLIC PANEL WITH GFR
ALBUMIN: 4.8 g/dL (ref 3.6–5.1)
ALK PHOS: 76 U/L (ref 40–115)
ALT: 67 U/L — ABNORMAL HIGH (ref 9–46)
AST: 46 U/L — AB (ref 10–35)
BUN: 15 mg/dL (ref 7–25)
CALCIUM: 9.7 mg/dL (ref 8.6–10.3)
CHLORIDE: 100 mmol/L (ref 98–110)
CO2: 24 mmol/L (ref 20–31)
Creat: 0.98 mg/dL (ref 0.70–1.33)
GFR, EST NON AFRICAN AMERICAN: 84 mL/min (ref >=60)
GFR, Est African American: >89 mL/min (ref >=60)
GLUCOSE: 154 mg/dL — AB (ref 65–99)
POTASSIUM: 4 mmol/L (ref 3.5–5.3)
SODIUM: 137 mmol/L (ref 135–146)
Total Bilirubin: 0.7 mg/dL (ref 0.2–1.2)
Total Protein: 7.1 g/dL (ref 6.1–8.1)

## 2015-09-21 LAB — TSH: TSH: 1.63 mIU/L (ref 0.40–4.50)

## 2015-09-21 LAB — URIC ACID: URIC ACID, SERUM: 5.8 mg/dL (ref 4.0–8.0)

## 2015-09-21 LAB — LIPID PANEL
CHOL/HDL RATIO: 6.5 ratio — AB (ref <=5.0)
CHOLESTEROL: 306 mg/dL — AB (ref 125–200)
HDL: 47 mg/dL (ref >=40)
LDL Cholesterol: 211 mg/dL — ABNORMAL HIGH (ref <130)
Triglycerides: 240 mg/dL — ABNORMAL HIGH (ref <150)
VLDL: 48 mg/dL — AB (ref <30)

## 2015-09-21 LAB — MICROALBUMIN, URINE: MICROALB UR: 0.5 mg/dL

## 2015-09-21 LAB — FERRITIN: FERRITIN: 194 ng/mL (ref 20–380)

## 2015-09-21 LAB — VITAMIN B12: VITAMIN B 12: 476 pg/mL (ref 200–1100)

## 2015-09-21 LAB — TESTOSTERONE: Testosterone: 189 ng/dL — ABNORMAL LOW (ref 250–827)

## 2015-09-21 MED ORDER — GABAPENTIN 300 MG PO CAPS: 300.0000 mg | ORAL_CAPSULE | Freq: Every day | ORAL | Status: DC

## 2015-09-21 MED ORDER — GABAPENTIN 300 MG PO CAPS: 300.0000 mg | ORAL_CAPSULE | Freq: Three times a day (TID) | ORAL | Status: DC

## 2015-09-21 MED ORDER — KETOROLAC TROMETHAMINE 0.5 % OP SOLN: 1.0000 [drp] | Freq: Two times a day (BID) | OPHTHALMIC | Status: DC | PRN

## 2015-09-21 NOTE — Progress Notes (Signed)
By signing my name below, I, [name], attest that this documentation has been prepared under the direction and in the presence of [name], MD.  Electronically Signed: [name], Medical Scribe. 09/21/2015. 10:16 AM.  Subjective:    Patient ID: Francis Cross, male    DOB: 06/19/1955, 60 y.o.   MRN: [id number]  HPI Chief Complaint  Patient presents with  . Annual Exam  . Medication Refill    JARDIANCE,LISINOPRIL,MELOXICAM,TESTOSTERONE    HPI Comments: Francis Cross is a 60 y.o. male who presents to the [facility] for his annual physical exam. He is a new pt to me, previous pt of Dr. Joseph Art. Pt had negative side affects while on B12. Pt reports abdominal pain, he suspects is due to a hernia. Pt still takes Meloxicam.  Gout: Pt has had gout in the past. Pt mentions joint pain- hands. Pt isn't taking anything for his gout. Pt reports numbness in his right third and second fingers on the top side. 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 Pt mentions he had carpool tunnel surgery in his hand in 2008. Pt states his hand stiffness has improved since then. Pt saw Dr. Lonell Grandchild for his surgery.  DM: He takes Jardiance QD. Pt's blood sugar has been ranging 130-140s in the morning before he eats, and around 190 in the middle of the day. Pt has had about 3-4 yeast infections on his penis since being on Jardiance- pt is uncircumcised. Pt is concerned about a burning, tingling, and pin-like sensation in his feet (around the ball of his feet) that has been going on for a year. Pt states the top of his toes almost feel raw, and occasionally tingles. Pt states the tingling has subsided, but the burning has been consistent for 2 years- he has never had any medications for it.  Lab Results  Component Value Date   HGBA1C 6.7 12/15/2014   No results found for: [name]  HTN: Takes Zestoretic. Pt denies  experiencing any side affects while on this medication- no fatigue, chest pain, SOB or abdominal pain. Lab Results  Component Value Date   CREATININE 1.22 01/30/2015   Low Testosterone: Last checked in October 2016, low at 258. Pt takes testosterone 1 ml application QD. Pt denies being dx with prostate CA. Lab Results  Component Value Date   PSA 0.69 09/20/2013   PSA 0.76 06/07/2012   PSA 0.72 10/14/2007   Lab Results  Component Value Date   CHOL 279* 12/15/2014   HDL 52 12/15/2014   LDLCALC 204* 12/15/2014   LDLDIRECT 152.1 10/12/2008   TRIG 117 12/15/2014   CHOLHDL 5.4* 12/15/2014   Lab Results  Component Value Date   WBC 6.5 12/15/2014   HGB 16.3 12/15/2014   HCT 46.8 12/15/2014   MCV 86.8 12/15/2014   PLT 206 09/20/2013   HLD: Borderline LFTs when last checked Lab Results  Component Value Date   CHOL 279* 12/15/2014   HDL 52 12/15/2014   LDLCALC 204* 12/15/2014   LDLDIRECT 152.1 10/12/2008   TRIG 117 12/15/2014   CHOLHDL 5.4* 12/15/2014   Lab Results  Component Value Date   ALT 38 01/30/2015   AST 30 01/30/2015   ALKPHOS 67 01/30/2015   BILITOT 0.5 01/30/2015   Hemachromatosis: Pt is just a carrier. Pt doesn't see anyone for it. Pt was told to get his blood checked. [facility]  [facility].  Lab Results  Component Value Date   WBC 6.5 12/15/2014   HGB 16.3 12/15/2014   HCT 46.8 12/15/2014   MCV 86.8 12/15/2014   PLT 206 09/20/2013   Seasonal Allergies: Takes Acular PRN. Pt states this helps his eye symptoms- itchiness.  Cancer Screening:  Colonoscopy: Last done November 2016- diverticuli, otherwise nl. Repeat in 10 years Prostate: Pt denies being dx with prostate CA. Lab Results  Component Value Date   PSA 0.69 09/20/2013   PSA 0.76 06/07/2012   PSA 0.72 10/14/2007   Immunizations: Pt has never had his PNA vaccine Immunization History  Administered Date(s) Administered  . Td 03/07/2008   Depression  Screening: Depression screen [facility] 2/9 09/21/2015 01/30/2015 12/15/2014 09/20/2013  Decreased Interest 0 0 0 0  Down, Depressed, Hopeless 0 0 0 0  PHQ - 2 Score 0 0 0 0   Vision: This is without correction, and was last checked with correction in 2016. Pt last saw his ophthalmologist about a year ago- no signs of DM neuropathy in his eyes. Pt see's them annually.  Visual Acuity Screening   Right eye Left eye Both eyes  Without correction: 20/50 20/70 20/40   With correction:      Exercise: Pt does gardening- moving about 3-4 acres for exercise.  Dentist: Pt has a dentist that follows him.  Patient Active Problem List   Diagnosis Date Noted  . Testosterone deficiency 05/23/2014  . Pain in the chest 10/01/2013  . GOUT, UNSPECIFIED 01/29/2010  . Obesity, unspecified 12/06/2008  . IRON OVERLOAD 08/11/2007  . HIATAL HERNIA WITH REFLUX 07/08/2006  . HYPERLIPIDEMIA 06/12/2006  . HYPERTENSION 06/12/2006  . ERECTILE DYSFUNCTION, 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   Past Medical History  Diagnosis Date  . Hypertension   . Hiatal hernia   . Acid reflux   . Erectile dysfunction   . Hyperlipidemia   . Gout   . Iron overload   . Obesity   . Hemochromatosis   . Diabetes mellitus without complication [facility])    Past Surgical History  Procedure Laterality Date  . Cardiac catheterization      MC  . Appendectomy    . Tonsillectomy    . Cervical fusion     Allergies  Allergen Reactions  . Cephalexin     REACTION: Rash  . Penicillins     REACTION: Rash   Prior to Admission medications   Medication Sig Start Date End Date Taking? Authorizing Provider  aspirin 81 MG tablet Take 81 mg by mouth daily.   Yes Historical Provider, MD  calcipotriene (DOVONOX) 0.005 % cream Apply topically 2 (two) times daily. 06/07/12  Yes [name], MD  empagliflozin (JARDIANCE) 10 MG TABS tablet Take 10 mg by mouth daily. 02/16/15  Yes [name], MD  fluconazole (DIFLUCAN) 150 MG tablet Take 1 tablet (150 mg  total) by mouth once. 01/30/15  Yes [name], MD  ketoconazole (NIZORAL) 2 % shampoo APPLY TOPICALLY 2 (TWO) TIMES A WEEK. 09/02/15  Yes [name], MD  ketorolac (ACULAR) 0.5 % ophthalmic solution Place 1 drop into both eyes 2 (two) times daily as needed. 12/15/14  Yes [name], MD  lisinopril-hydrochlorothiazide (PRINZIDE,ZESTORETIC) 20-12.5 MG tablet Take 1 tablet by mouth daily. 08/03/15  Yes [name], MD  meloxicam (MOBIC) 15 MG tablet Take 1 tablet (15 mg total) by mouth daily. 12/15/14  Yes [name], MD  nystatin cream (MYCOSTATIN) Apply 1 application topically 2 (two) times daily. 01/30/15  Yes [name], MD  UNABLE TO FIND TESTOSTERONE LIPODERM 5% (50 MG/ML) CREAM. Apply 1 ml (4 clicks) topically once a day as directed. 02/28/14  Yes [name], MD  Testosterone 30 MG/ACT SOLN Apply 1 ml 4 clicks topically once a day as directed. Patient not taking: Reported on 09/21/2015 03/29/15   [name], MD   Social History   Social History  . Marital Status: Married    Spouse Name: N/A  . Number of Children: N/A  . Years of Education: N/A   Occupational History  . Not on file.   Social History Main Topics  . Smoking status: Former Research scientist (life sciences)  . Smokeless tobacco: Former Systems developer  . Alcohol Use: No     Comment: occ beer  . Drug Use: No  . Sexual Activity: Not on file   Other Topics Concern  . Not on file   Social History Narrative   Review of Systems  Musculoskeletal: Positive for arthralgias.  13 point ROS negative besides discussed above. Objective:  BP 120/80 mmHg  Pulse 67  Temp(Src) 97.9 F (36.6 C) (Oral)  Resp 18  Ht 5\' 7"  (1.702 m)  Wt 205 lb (92.987 kg)  BMI 32.10 kg/m2  SpO2 95%  Physical Exam  Constitutional: He is oriented to person, place, and time. He appears well-developed and well-nourished.  HENT:  Head: Normocephalic and atraumatic.  Right Ear: External ear normal.  Left Ear: External ear normal.  Mouth/Throat:  Oropharynx is clear and moist.  Eyes: Conjunctivae and EOM are normal. Pupils are equal, round, and reactive to light.  Neck: Normal range of motion. Neck supple. No thyromegaly present.  Cardiovascular: Normal rate, regular rhythm, normal heart sounds and intact distal pulses.   Pulmonary/Chest: Effort normal and breath sounds normal. No respiratory distress. He has no wheezes.  Abdominal: Soft. He exhibits no distension. There is tenderness (mild) in the right upper quadrant. Hernia confirmed negative in the right inguinal area and confirmed negative in the left inguinal area.  Midline defect easily reducible  Genitourinary: Prostate normal.  Musculoskeletal: Normal range of motion. He exhibits no edema or tenderness.  Lymphadenopathy:    He has no cervical adenopathy.  Neurological: He is alert and oriented to person, place, and time. He has normal reflexes.  Skin: Skin is warm and dry.  Psychiatric: He has a normal mood and affect. His behavior is normal.  Vitals reviewed.  Assessment & Plan:   England Paszkiewicz is a 60 y.o. male Annual physical exam  --anticipatory guidance as below in AVS, screening labs above. Health maintenance items as above in HPI discussed/recommended as applicable.   Other polyneuropathy ([place]) - Plan: Vitamin B12, Vitamin D, 25-hydroxy, gabapentin (NEURONTIN) 300 MG capsule, DISCONTINUED: gabapentin (NEURONTIN) 300 MG capsule  Other diabetic neurological complication associated with type 2 diabetes mellitus ([place]) - Plan: TSH, Microalbumin, urine  -Suspected diabetic neuropathy. Trial of Neurontin, check vitamin B12, vitamin D, TSH.  Pain of right hand - Plan: Uric Acid  -Possible gout versus inflammatory arthritis. Few nodules seen on DIPs, OA versus RA in differential diagnosis. Plan for follow-up appointment to discuss your test results and to look into other causes of arthralgias.  Screening for hyperlipidemia - Plan: COMPLETE METABOLIC PANEL WITH GFR, Lipid  panel  Controlled type 2 diabetes mellitus with diabetic neuropathy, without long-term current use of insulin (HCC) - Plan: Hemoglobin A1c, DISCONTINUED: gabapentin (NEURONTIN) 300 MG capsule  -Check A1c, continue Jardiance for now.  History of gout - Plan: Uric Acid  Hemochromatosis - Plan: CBC with Differential/Platelet, Ferritin  -Check CBC/ferritin. Stable previously. Consider hematology eval if needed.  Low testosterone - Plan: Testosterone  -Check level, then can decide if does increase needed as recommended previously. PSA obtained, no concerning findings on DRE.  Allergic conjunctivitis, bilateral - Plan: ketorolac (ACULAR) 0.5 % ophthalmic solution  -Allergic conjunctivitis versus dryness. Has had relief with Acular, refilled for now, we can discuss further next visit.  High risk medication use - Plan: PSA  We discussed pros and cons of prostate cancer screening, but also as he takes testosterone supplement, needs PSA monitored. psa obtained, and no concerning findings on DRE.   Need for prophylactic vaccination against Streptococcus pneumoniae (pneumococcus) - Plan: Pneumococcal polysaccharide vaccine 23-valent greater than or equal to 2yo subcutaneous/IM  -Pneumovax given.  Meds ordered this encounter  Medications  . ketorolac (ACULAR) 0.5 % ophthalmic solution    Sig: Place 1 drop into both eyes 2 (two) times daily as needed.    Dispense:  5 mL    Refill:  1  . DISCONTD: gabapentin (NEURONTIN) 300 MG capsule    Sig: Take 1 capsule (300 mg total) by mouth 3 (three) times daily.    Dispense:  30 capsule    Refill:  1  . gabapentin (NEURONTIN) 300 MG capsule    Sig: Take 1 capsule (300 mg total) by mouth at bedtime.    Dispense:  30 capsule    Refill:  3   Patient Instructions    I will check some blood work for the aches and pains in your hands and feet as well as the numbness and tingling in your feet, but follow up to discuss that further. It appears that this  may be peripheral neuropathy, so start Neurontin once at bedtime. I will also check a diabetes test and other blood work as we discussed today, and can let you know about those results. No change in medications including testosterone at this time until we have some of those results.  As we discussed, I would like to wean you off of meloxicam due to possible cardiac risks, we can discuss this further as well as plans for the aches and pains at next visit.  I refilled your eyedrops, but if any worsening of your eye symptoms, follow-up with me or your eye care provider.  If any increase pain In the abdomen or hernia does not reduce on its own, return here or emergency room right away. We can discuss this at future visit as well.  Follow  up with me in the next 1 month to discuss some of the issues we started today. Sooner if any worsening of your symptoms.  Keeping you healthy  Get these tests  Blood pressure- Have your blood pressure checked once a year by your healthcare provider.  Normal blood pressure is 120/80  Weight- Have your body mass index (BMI) calculated to screen for obesity.  BMI is a measure of body fat based on height and weight. You can also calculate your own BMI at [employer].  Cholesterol- Have your cholesterol checked every year.  Diabetes- Have your blood sugar checked regularly if you have high blood pressure, high cholesterol, have a family history of diabetes or if you are overweight.  Screening for Colon Cancer- Colonoscopy starting at age 29.  Screening may begin sooner depending on your family history and other health conditions. Follow up colonoscopy as directed by your Gastroenterologist.  Screening for Prostate Cancer- Both blood work (PSA)  and a rectal exam help screen for Prostate Cancer.  Screening begins at age 56 with African-American men and at age 23 with Caucasian men.  Screening may begin sooner depending on your family history.  Take these  medicines  Aspirin- One aspirin daily can help prevent Heart disease and Stroke.  Flu shot- Every fall.  Tetanus- Every 10 years.  Zostavax- Once after the age of 13 to prevent Shingles.  Pneumonia shot- Once after the age of 85; if you are younger than 66, ask your healthcare provider if you need a Pneumonia shot.  Take these steps  Don't smoke- If you do smoke, talk to your doctor about quitting.  For tips on how to quit, go to www.smokefree.gov or call 1-800-QUIT-NOW.  Be physically active- Exercise 5 days a week for at least 30 minutes.  If you are not already physically active start slow and gradually work up to 30 minutes of moderate physical activity.  Examples of moderate activity include walking briskly, mowing the yard, dancing, swimming, bicycling, etc.  Eat a healthy diet- Eat a variety of healthy food such as fruits, vegetables, low fat milk, low fat cheese, yogurt, lean meant, poultry, fish, beans, tofu, etc. For more information go to www.thenutritionsource.org  Drink alcohol in moderation- Limit alcohol intake to less than two drinks a day. Never drink and drive.  Dentist- Brush and floss twice daily; visit your dentist twice a year.  Depression- Your emotional health is as important as your physical health. If you're feeling down, or losing interest in things you would normally enjoy please talk to your healthcare provider.  Eye exam- Visit your eye doctor every year.  Safe sex- If you may be exposed to a sexually transmitted infection, use a condom.  Seat belts- Seat belts can save your life; always wear one.  Smoke/Carbon Monoxide detectors- These detectors need to be installed on the appropriate level of your home.  Replace batteries at least once a year.  Skin cancer- When out in the sun, cover up and use sunscreen 15 SPF or higher.  Violence- If anyone is threatening you, please tell your healthcare provider.  Living Will/ Health care power of attorney-  Speak with your healthcare provider and family.   IF you received an x-ray today, you will receive an invoice from [facility]. Please contact [facility] at [phone] with questions or concerns regarding your invoice.   IF you received labwork today, you will receive an invoice from [employer]. Please contact [facility] at [phone] with questions or concerns regarding your invoice.   Our billing staff will not be able to assist you with questions regarding bills from these companies.  You will be contacted with the lab results as soon as they are available. The fastest way to get your results is to activate your My Chart account. Instructions are located on the last page of this paperwork. If you have not heard from [place] regarding the results in 2 weeks, please contact this office.         I personally performed the services described in this documentation, which was scribed in my presence. The recorded information has been reviewed and considered, and addended by me as needed.   Signed,   [name], MD [facility].  09/22/2015 2:22 PM

## 2015-09-21 NOTE — Patient Instructions (Signed)
I will check some blood work for the aches and pains in your hands and feet as well as the numbness and tingling in your feet, but follow up to discuss that further. It appears that this may be peripheral neuropathy, so start Neurontin once at bedtime. I will also check a diabetes test and other blood work as we discussed today, and can let you know about those results. No change in medications including testosterone at this time until we have some of those results.  As we discussed, I would like to wean you off of meloxicam due to possible cardiac risks, we can discuss this further as well as plans for the aches and pains at next visit.  I refilled your eyedrops, but if any worsening of your eye symptoms, follow-up with me or your eye care provider.  If any increase pain In the abdomen or hernia does not reduce on its own, return here or emergency room right away. We can discuss this at future visit as well.  Follow  up with me in the next 1 month to discuss some of the issues we started today. Sooner if any worsening of your symptoms.  Keeping you healthy  Get these tests  Blood pressure- Have your blood pressure checked once a year by your healthcare provider.  Normal blood pressure is 120/80  Weight- Have your body mass index (BMI) calculated to screen for obesity.  BMI is a measure of body fat based on height and weight. You can also calculate your own BMI at ViewBanking.si.  Cholesterol- Have your cholesterol checked every year.  Diabetes- Have your blood sugar checked regularly if you have high blood pressure, high cholesterol, have a family history of diabetes or if you are overweight.  Screening for Colon Cancer- Colonoscopy starting at age 74.  Screening may begin sooner depending on your family history and other health conditions. Follow up colonoscopy as directed by your Gastroenterologist.  Screening for Prostate Cancer- Both blood work (PSA) and a rectal exam help  screen for Prostate Cancer.  Screening begins at age 17 with African-American men and at age 18 with Caucasian men.  Screening may begin sooner depending on your family history.  Take these medicines  Aspirin- One aspirin daily can help prevent Heart disease and Stroke.  Flu shot- Every fall.  Tetanus- Every 10 years.  Zostavax- Once after the age of 86 to prevent Shingles.  Pneumonia shot- Once after the age of 34; if you are younger than 76, ask your healthcare provider if you need a Pneumonia shot.  Take these steps  Don't smoke- If you do smoke, talk to your doctor about quitting.  For tips on how to quit, go to www.smokefree.gov or call 1-800-QUIT-NOW.  Be physically active- Exercise 5 days a week for at least 30 minutes.  If you are not already physically active start slow and gradually work up to 30 minutes of moderate physical activity.  Examples of moderate activity include walking briskly, mowing the yard, dancing, swimming, bicycling, etc.  Eat a healthy diet- Eat a variety of healthy food such as fruits, vegetables, low fat milk, low fat cheese, yogurt, lean meant, poultry, fish, beans, tofu, etc. For more information go to www.thenutritionsource.org  Drink alcohol in moderation- Limit alcohol intake to less than two drinks a day. Never drink and drive.  Dentist- Brush and floss twice daily; visit your dentist twice a year.  Depression- Your emotional health is as important as your physical health. If you're  feeling down, or losing interest in things you would normally enjoy please talk to your healthcare provider.  Eye exam- Visit your eye doctor every year.  Safe sex- If you may be exposed to a sexually transmitted infection, use a condom.  Seat belts- Seat belts can save your life; always wear one.  Smoke/Carbon Monoxide detectors- These detectors need to be installed on the appropriate level of your home.  Replace batteries at least once a year.  Skin cancer- When  out in the sun, cover up and use sunscreen 15 SPF or higher.  Violence- If anyone is threatening you, please tell your healthcare provider.  Living Will/ Health care power of attorney- Speak with your healthcare provider and family.   IF you received an x-ray today, you will receive an invoice from [facility]. Please contact [facility] at [phone] with questions or concerns regarding your invoice.   IF you received labwork today, you will receive an invoice from [employer]. Please contact [facility] at [phone] with questions or concerns regarding your invoice.   Our billing staff will not be able to assist you with questions regarding bills from these companies.  You will be contacted with the lab results as soon as they are available. The fastest way to get your results is to activate your My Chart account. Instructions are located on the last page of this paperwork. If you have not heard from [place] regarding the results in 2 weeks, please contact this office.

## 2015-09-22 LAB — HEMOGLOBIN A1C
HEMOGLOBIN A1C: 7.8 % — AB (ref <5.7)
MEAN PLASMA GLUCOSE: 177 mg/dL

## 2015-09-22 LAB — VITAMIN D 25 HYDROXY (VIT D DEFICIENCY, FRACTURES): VIT D 25 HYDROXY: 34 ng/mL (ref 30–100)

## 2015-09-22 LAB — PSA: PSA: 0.8 ng/mL (ref <=4.00)

## 2015-09-24 ENCOUNTER — Other Ambulatory Visit: Payer: Self-pay | Admitting: Family Medicine

## 2015-09-26 ENCOUNTER — Encounter: Payer: Self-pay | Admitting: Family Medicine

## 2015-10-02 ENCOUNTER — Other Ambulatory Visit: Payer: Self-pay | Admitting: Family Medicine

## 2015-10-02 DIAGNOSIS — E291 Testicular hypofunction: Secondary | ICD-10-CM

## 2015-10-11 ENCOUNTER — Encounter: Payer: Self-pay | Admitting: Physician Assistant

## 2015-10-11 ENCOUNTER — Ambulatory Visit (INDEPENDENT_AMBULATORY_CARE_PROVIDER_SITE_OTHER): Payer: BLUE CROSS/BLUE SHIELD | Admitting: Physician Assistant

## 2015-10-11 VITALS — BP 138/90 | HR 78 | Temp 98.2°F | Resp 17 | Ht 67.0 in | Wt 206.0 lb

## 2015-10-11 DIAGNOSIS — E291 Testicular hypofunction: Secondary | ICD-10-CM

## 2015-10-11 DIAGNOSIS — Z0289 Encounter for other administrative examinations: Secondary | ICD-10-CM

## 2015-10-11 LAB — TESTOSTERONE: TESTOSTERONE: 240 ng/dL — AB (ref 250–827)

## 2015-10-11 NOTE — Patient Instructions (Signed)
     IF you received an x-ray today, you will receive an invoice from [facility]. Please contact [facility] at [phone] with questions or concerns regarding your invoice.   IF you received labwork today, you will receive an invoice from [facility]. Please contact [facility] at [phone] with questions or concerns regarding your invoice.   Our billing staff will not be able to assist you with questions regarding bills from these companies.  You will be contacted with the lab results as soon as they are available. The fastest way to get your results is to activate your My Chart account. Instructions are located on the last page of this paperwork. If you have not heard from us regarding the results in 2 weeks, please contact this office.      

## 2015-10-11 NOTE — Progress Notes (Signed)
[facility] [address] [place] [address] [phone]  By signing my name below I, Raven Small, attest that this documentation has been prepared under the direction and in the presence of Ivar Drape PA. Electonically Signed. Raven Small, Scribe 10/11/2015 at 8:17 AM  Date:  10/11/2015   Name:  Francis Cross   DOB:  02/10/1956   MRN:  [id number]  PCP:  Eliezer Lofts, MD    History of Present Illness:  Francis Cross is a 60 y.o. male patient who presents to [facility] for a DOT physical.   He has no concerns or complaints at this time.   Hx of DM Lab Results  Component Value Date   HGBA1C 7.8 (H) 09/21/2015   Pt lasted check sugars 3 weeks ago at last visit with Dr. Carlota Raspberry but when he does check, sugars range from 120-130. He takes [place] once a day.  He denies urinary symptoms.   Pt is not a smoker.    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    Past Medical History:  Diagnosis Date   Acid reflux    Diabetes mellitus without complication (HCC)    Erectile dysfunction    Gout    Hemochromatosis    Hiatal hernia    Hyperlipidemia    Hypertension    Iron overload    Obesity     Past Surgical History:  Procedure Laterality Date   APPENDECTOMY     CARDIAC CATHETERIZATION     [facility]   CERVICAL FUSION     TONSILLECTOMY      Social History  Substance Use Topics   Smoking status: Former Smoker   Smokeless tobacco: Former Systems developer   Alcohol use No     Comment: occ beer    Family History  Problem Relation Age of Onset   Cancer Father    Cancer Mother    Hyperlipidemia Sister    Hypertension Sister     Heart disease Brother     Allergies  Allergen Reactions   Cephalexin     REACTION: Rash   Penicillins     REACTION: Rash    Medication list has been reviewed and updated.  Current Outpatient Prescriptions on File Prior to Visit  Medication Sig Dispense Refill   aspirin 81 MG tablet Take 81 mg by mouth daily.     calcipotriene (DOVONOX) 0.005 % cream Apply topically 2 (two) times daily. 60 g 6   empagliflozin (JARDIANCE) 10 MG TABS tablet Take 10 mg by mouth daily. 90 tablet 1   fluconazole (DIFLUCAN) 150 MG tablet Take 1 tablet (150 mg total) by mouth once. 1 tablet 0   gabapentin (NEURONTIN) 300 MG capsule Take 1 capsule (300 mg total) by mouth at bedtime. 30 capsule 3   ketoconazole (NIZORAL) 2 % shampoo APPLY TOPICALLY 2 (TWO) TIMES A WEEK. 120 mL 1   ketorolac (ACULAR) 0.5 % ophthalmic solution Place 1 drop into both eyes 2 (two) times daily as needed. 5 mL 1   lisinopril-hydrochlorothiazide (PRINZIDE,ZESTORETIC) 20-12.5 MG tablet Take 1 tablet by mouth daily. 90 tablet 0   meloxicam (MOBIC) 15 MG tablet Take 1 tablet (15 mg total) by mouth daily.  90 tablet 1   nystatin cream (MYCOSTATIN) Apply 1 application topically 2 (two) times daily. 30 g 3   Testosterone 30 MG/ACT SOLN Apply 1 ml 4 clicks topically once a day as directed. (Patient not taking: Reported on 09/21/2015) 75 mL 4   UNABLE TO FIND TESTOSTERONE LIPODERM 5% (50 MG/ML) CREAM. Apply 1 ml (4 clicks) topically once a day as directed. 30 mL 4   No current facility-administered medications on file prior to visit.     Review of Systems  Constitutional: Negative for chills and fever.  HENT: Positive for ear pain and sore throat. Negative for ear discharge.   Eyes: Negative for blurred vision and double vision.  Respiratory: Negative for cough, shortness of breath and wheezing.   Cardiovascular: Negative for chest pain, palpitations and leg swelling.  Gastrointestinal: Negative for diarrhea, nausea and  vomiting.  Genitourinary: Negative for dysuria, frequency and hematuria.  Skin: Negative for itching and rash.  Neurological: Negative for dizziness and headaches.  Endo/Heme/Allergies: Positive for environmental allergies.   ROS unremarkable unless otherwise specified.  Physical Examination: BP 138/90 (BP Location: Right Arm, Patient Position: Sitting, Cuff Size: Large)    Pulse 78    Temp 98.2 F (36.8 C) (Oral)    Resp 17    Ht 5\' 7"  (1.702 m)    Wt 206 lb (93.4 kg)    SpO2 97%    BMI 32.26 kg/m  Ideal Body Weight: @FLOWAMB [id number]   Visual Acuity Screening   Right eye Left eye Both eyes  Without correction:     With correction: 20 20 20 20 20 15   Comments: Left:85 Right:70  Hearing Screening Comments: Passed whisper test at 73ft   Physical Exam  Constitutional: He is oriented to person, place, and time. He appears well-developed and well-nourished. No distress.  HENT:  Head: Normocephalic and atraumatic.  Right Ear: Tympanic membrane, external ear and ear canal normal.  Left Ear: Tympanic membrane, external ear and ear canal normal. Tympanic membrane is not injected and not erythematous.  Nose: Nose normal.  Mouth/Throat: Oropharynx is clear and moist.  Eyes: Conjunctivae and EOM are normal. Pupils are equal, round, and reactive to light.  Neck: Normal range of motion. No thyromegaly present.  Cardiovascular: Normal rate, regular rhythm, normal heart sounds and intact distal pulses.  Exam reveals no gallop and no friction rub.   No murmur heard. Pulmonary/Chest: Effort normal and breath sounds normal. No respiratory distress. He has no wheezes. He has no rales.  Abdominal: Soft. Bowel sounds are normal. He exhibits no distension and no mass. There is no tenderness.  Musculoskeletal: Normal range of motion. He exhibits no edema or tenderness.  Neurological: He is alert and oriented to person, place, and time. He has normal strength and normal reflexes. He displays  normal reflexes. No cranial nerve deficit.  Reflex Scores:      Patellar reflexes are 2+ on the right side and 2+ on the left side. Skin: Skin is warm and dry. He is not diaphoretic.  Psychiatric: He has a normal mood and affect. His behavior is normal.  Vitals reviewed.    Assessment and Plan: Francis Cross is a 60 y.o. male who is here today for DOT physical. -1 year clearance today -glucose secondary to jardaince. Encounter for examination required by Department of Transportation (DOT)  Hypogonadism male - Plan: Testosterone   Ivar Drape, PA-C [facility] [place] 8/9/20175:10 PM  Patient is also here for a  labs only.  Testosterone was therefore ordered by lab, but this was not reviewed during his visit.

## 2015-10-13 NOTE — Progress Notes (Signed)
I am not this patient's PCP.Marland Kitchen Please change in system.  Ask pt who it is. ? Dr. Rory Percy this DOT physical to correct PCP.

## 2015-10-23 ENCOUNTER — Encounter: Payer: Self-pay | Admitting: *Deleted

## 2015-10-25 ENCOUNTER — Encounter: Payer: Self-pay | Admitting: Family Medicine

## 2015-11-01 NOTE — Telephone Encounter (Signed)
He is taking 4 clicks topically every day.

## 2015-11-02 ENCOUNTER — Other Ambulatory Visit: Payer: Self-pay | Admitting: Family Medicine

## 2015-11-02 NOTE — Telephone Encounter (Signed)
It appears he is using Axiron, but not sure as testosterone Lipoderm also listed. Please clarify actual testosterone product he has been using. If Axiron, then max dose is 120mg  once per day, which would be his current dose of 4 clicks. I would not feel comfortable increasing his dose beyond the maximum dose. If he is on Axiron at max dose and still with low testosterone, I would like him to meet with either urology or endocrinology to discuss these results and options. Let me know.

## 2015-11-03 ENCOUNTER — Other Ambulatory Visit: Payer: Self-pay

## 2015-11-03 MED ORDER — LISINOPRIL-HYDROCHLOROTHIAZIDE 20-12.5 MG PO TABS: 1.0000 | ORAL_TABLET | Freq: Every day | ORAL | 0 refills | Status: DC

## 2015-11-13 ENCOUNTER — Other Ambulatory Visit: Payer: Self-pay

## 2015-11-13 DIAGNOSIS — G6289 Other specified polyneuropathies: Secondary | ICD-10-CM

## 2015-11-13 MED ORDER — GABAPENTIN 300 MG PO CAPS: 300.0000 mg | ORAL_CAPSULE | Freq: Every day | ORAL | 0 refills | Status: DC

## 2015-11-13 NOTE — Telephone Encounter (Signed)
Dr Carlota Raspberry, I called and spoke to pt who said he had given the Rx info to someone last week, but I don't see the notes. He clarified that he is using Lipoderm from [place] (not Axiron which I will DC from list) and the Rx reads 5%, 50 mg/ml, 1 ml = 4 clicks, so pt is using 50 ml QD.

## 2015-11-25 ENCOUNTER — Encounter: Payer: Self-pay | Admitting: Family Medicine

## 2015-11-25 DIAGNOSIS — E291 Testicular hypofunction: Secondary | ICD-10-CM

## 2015-11-29 NOTE — Telephone Encounter (Signed)
Pharmacy called, will increase to 6 clicks daily, and recheck in 6 weeks for morning testosterone level. One month prescription with 2 refills were called in, but please advise patient return for testosterone level as lab only visit in 6 weeks

## 2015-12-02 ENCOUNTER — Other Ambulatory Visit: Payer: Self-pay | Admitting: Family Medicine

## 2015-12-02 DIAGNOSIS — E1165 Type 2 diabetes mellitus with hyperglycemia: Principal | ICD-10-CM

## 2015-12-02 DIAGNOSIS — IMO0001 Reserved for inherently not codable concepts without codable children: Secondary | ICD-10-CM

## 2015-12-05 NOTE — Telephone Encounter (Signed)
Last ov 12/02/15  Last labs 09/2015

## 2015-12-11 ENCOUNTER — Telehealth: Payer: Self-pay

## 2015-12-11 NOTE — Telephone Encounter (Signed)
PATIENT STATES HE HAD A DOT PHYSICAL DONE WITH STEPHANIE ENGLISH ON October 11, 2015. HE HAS LOST HIS DOT CARD AND NEEDS TO GET A REPLACEMENT. PLEASE CALL HIM WHEN HE CAN COME TO PICK IT UP. BEST PHONE [phone] (CELL)  Francis Cross

## 2015-12-11 NOTE — Telephone Encounter (Signed)
PLEASE DISREGARD THIS MESSAGE. HE REALIZED HE PUT HIS DOT CARD IN HIS COPIER TO MAKE A COPY AND LEFT IT IN THERE.  HE HAS FOUND IT. THANK YOU MRS. April. [place]

## 2015-12-13 ENCOUNTER — Other Ambulatory Visit: Payer: Self-pay | Admitting: Family Medicine

## 2015-12-13 DIAGNOSIS — M545 Low back pain: Principal | ICD-10-CM

## 2015-12-13 DIAGNOSIS — G8929 Other chronic pain: Secondary | ICD-10-CM

## 2015-12-22 ENCOUNTER — Encounter: Payer: Self-pay | Admitting: Physician Assistant

## 2015-12-22 DIAGNOSIS — Z9889 Other specified postprocedural states: Secondary | ICD-10-CM | POA: Insufficient documentation

## 2016-01-01 ENCOUNTER — Encounter: Payer: Self-pay | Admitting: Family Medicine

## 2016-01-29 ENCOUNTER — Other Ambulatory Visit: Payer: Self-pay | Admitting: Family Medicine

## 2016-02-20 ENCOUNTER — Encounter: Payer: Self-pay | Admitting: Gastroenterology

## 2016-02-20 ENCOUNTER — Other Ambulatory Visit: Payer: Self-pay | Admitting: Family Medicine

## 2016-02-20 DIAGNOSIS — E1165 Type 2 diabetes mellitus with hyperglycemia: Principal | ICD-10-CM

## 2016-02-20 DIAGNOSIS — IMO0001 Reserved for inherently not codable concepts without codable children: Secondary | ICD-10-CM

## 2016-02-22 NOTE — Telephone Encounter (Signed)
09/2015 last ov and lab

## 2016-03-29 ENCOUNTER — Ambulatory Visit: Payer: Self-pay | Admitting: Cardiovascular Disease

## 2016-03-31 ENCOUNTER — Encounter: Payer: Self-pay | Admitting: Family Medicine

## 2016-04-01 ENCOUNTER — Telehealth: Payer: Self-pay

## 2016-04-01 NOTE — Telephone Encounter (Signed)
Please call pt - let him know I received his email.   Either medication change would be an option, however he is overdue for follow up for diabetes. Last A1c in July 2017 and email sent in October recommending return office visit.   There is an extra caution with Invokana with regard to foot and toe amputations, but of most concern in those with cardiovascular disease or peripheral vascular disease risk.  Can discuss these concerns and come up with a plan, but please obtain an appointment to discuss further. Thanks.  -JG.

## 2016-04-01 NOTE — Telephone Encounter (Signed)
JARDIANCE NOT COVERED BY INSURANCE CHANGE TO FARXIGA OR INVOKANA  SEE PT EMAIL TOO.

## 2016-04-02 NOTE — Telephone Encounter (Signed)
Spoke with patient daughter, she states she will have her dad come in for an appointment to discuss further changes in medication.

## 2016-04-08 ENCOUNTER — Encounter: Payer: Self-pay | Admitting: Gastroenterology

## 2016-04-08 ENCOUNTER — Ambulatory Visit (INDEPENDENT_AMBULATORY_CARE_PROVIDER_SITE_OTHER): Payer: BLUE CROSS/BLUE SHIELD | Admitting: Gastroenterology

## 2016-04-08 VITALS — BP 126/86 | HR 80 | Ht 65.5 in | Wt 204.0 lb

## 2016-04-08 DIAGNOSIS — R1013 Epigastric pain: Secondary | ICD-10-CM

## 2016-04-08 DIAGNOSIS — G8929 Other chronic pain: Secondary | ICD-10-CM | POA: Diagnosis not present

## 2016-04-08 NOTE — Progress Notes (Signed)
[facility] Consult Note:  History: Francis Cross 04/08/2016  Referring physician: [name], MD  Reason for consult/chief complaint: Abdominal Pain (generalzied pain X 3-4 months; pain has eased off since appt was made)   Subjective  HPI:  [name] was referred by primary care for epigastric pain. It is been going on for several months, it is dull and nonradiating and often better with food. He had been on aspirin regularly and meloxicam. The problems seem to worsen when his physician changed him to Naprosyn. He was having more frequent and intense abdominal pain that was nonradiating with no clear change with meals. At times he will feel a brief sensation food might feel stuck in the chest, and he has no nausea vomiting early satiety or weight loss. His bowel habits are regular with the use of daily fiber, he denies rectal bleeding. An upper endoscopy by Dr. Sharlett Iles in October 2010 reportedly had mild antral erythema, H. pylori (CLO) biopsy was negative. [place] also had a normal colonoscopy in August 2010.   ROS:  Review of Systems  Constitutional: Negative for appetite change and unexpected weight change.  HENT: Negative for mouth sores and voice change.   Eyes: Negative for pain and redness.  Respiratory: Negative for cough and shortness of breath.   Cardiovascular: Negative for chest pain and palpitations.  Genitourinary: Negative for dysuria and hematuria.  Musculoskeletal: Positive for arthralgias. Negative for myalgias.  Skin: Negative for pallor and rash.  Neurological: Negative for weakness and headaches.  Hematological: Negative for adenopathy.     Past Medical History: Past Medical History:  Diagnosis Date  . Acid reflux   . Diabetes mellitus without complication ([place])   . Erectile dysfunction   . Gout   . Hemochromatosis   . Hiatal hernia   . Hyperlipidemia   . Hypertension   . Iron overload   . Obesity      Past Surgical  History: Past Surgical History:  Procedure Laterality Date  . APPENDECTOMY    . CARDIAC CATHETERIZATION     [place]    . TONSILLECTOMY       Family History: Family History  Problem Relation Age of Onset  . Cancer Father     lung  . Cancer Mother     melanoma  . Hyperlipidemia Sister   . Hypertension Sister   . Colon cancer Neg Hx   . Stomach cancer Neg Hx   . Rectal cancer Neg Hx   . Esophageal cancer Neg Hx   . Liver cancer Neg Hx     Social History: Social History   Social History  . Marital status: Married    Spouse name: N/A  . Number of children: 3  . Years of education: N/A   Occupational History  . retired    Social History Main Topics  . Smoking status: Former Research scientist (life sciences)  . Smokeless tobacco: Former Systems developer  . Alcohol use Yes     Comment: occ beer  . Drug use: No  . Sexual activity: Not Asked   Other Topics Concern  . None   Social History Narrative  . None    Allergies: Allergies  Allergen Reactions  . Cephalexin     REACTION: Rash  . Penicillins     REACTION: Rash    Outpatient Meds: Current Outpatient Prescriptions  Medication Sig Dispense Refill  . aspirin 81 MG tablet Take 81 mg by mouth daily.    Marland Kitchen JARDIANCE 10 MG TABS tablet  TAKE 1 TABLET BY MOUTH DAILY. 30 tablet 1  . ketoconazole (NIZORAL) 2 % shampoo APPLY TOPICALLY 2 (TWO) TIMES A WEEK. 120 mL 1  . ketorolac (ACULAR) 0.5 % ophthalmic solution Place 1 drop into both eyes 2 (two) times daily as needed. 5 mL 1  . lisinopril-hydrochlorothiazide (PRINZIDE,ZESTORETIC) 20-12.5 MG tablet TAKE 1 TABLET BY MOUTH EVERY DAY 90 tablet 0  . nystatin cream (MYCOSTATIN) Apply 1 application topically 2 (two) times daily. 30 g 3  . UNABLE TO FIND TESTOSTERONE LIPODERM 5% (50 MG/ML) CREAM. Apply 1 ml (4 clicks) topically once a day as directed. 30 mL 4   No current facility-administered medications for this visit.        ___________________________________________________________________ Objective   Exam:  BP 126/86   Pulse 80   Ht 5' 5.5" (1.664 m)   Wt 204 lb (92.5 kg)   BMI 33.43 kg/m    General: this is a(n) Obese and otherwise well-appearing middle-aged man   Eyes: sclera anicteric, no redness  ENT: oral mucosa moist without lesions, no cervical or supraclavicular lymphadenopathy, good dentition  CV: RRR without murmur, S1/S2, no JVD, no peripheral edema  Resp: clear to auscultation bilaterally, normal RR and effort noted  GI: soft, rectus diastasis, mild epigastric tenderness, with active bowel sounds. No guarding or palpable organomegaly noted.  Skin; warm and dry, no rash or jaundice noted  Neuro: awake, alert and oriented x 3. Normal gross motor function and fluent speech  Labs:  Previous endoscopy and biopsy reports as noted above   Hemoglobin 17.5, ferritin 194 in July 2017 Assessment: Encounter Diagnosis  Name Primary?  . Abdominal pain, chronic, epigastric Yes    Difficult to tell if it is just NSAID gastropathy or perhaps peptic ulcer. He is reported hemochromatosis needs further investigation, as I can find no history of liver biopsy or office notes from hematology.  Plan:  EGD -  He is agreeable  The benefits and risks of the planned procedure were described in detail with the patient or (when appropriate) their health care proxy.  Risks were outlined as including, but not limited to, bleeding, infection, perforation, adverse medication reaction leading to cardiac or pulmonary decompensation, or pancreatitis (if ERCP).  The limitation of incomplete mucosal visualization was also discussed.  No guarantees or warranties were given.  Omeprazole every other day  Thank you for the courtesy of this consult.  Please call me with any questions or concerns.  Nelida Meuse III  CC: [name], MD

## 2016-04-08 NOTE — Patient Instructions (Signed)
If you are age 60 or older, your body mass index should be between 23-30. Your Body mass index is 33.43 kg/m. If this is out of the aforementioned range listed, please consider follow up with your Primary Care Provider.  If you are age 28 or younger, your body mass index should be between 19-25. Your Body mass index is 33.43 kg/m. If this is out of the aformentioned range listed, please consider follow up with your Primary Care Provider.   You have been scheduled for an endoscopy. Please follow written instructions given to you at your visit today. If you use inhalers (even only as needed), please bring them with you on the day of your procedure. Your physician has requested that you go to www.startemmi.com and enter the access code given to you at your visit today. This web site gives a general overview about your procedure. However, you should still follow specific instructions given to you by our office regarding your preparation for the procedure.  Thank you for choosing [facility]  Dr Wilfrid Lund III

## 2016-04-09 ENCOUNTER — Ambulatory Visit: Payer: Self-pay | Admitting: Gastroenterology

## 2016-04-10 NOTE — Progress Notes (Signed)
Subjective:    Patient ID: Francis Cross, male    DOB: 1955-09-30, 61 y.o.   MRN: [id number] Chief Complaint  Patient presents with  . Establish Care    TRANSFER FROM Dr. Joseph Art  . lab work  . Medication Refill    jardiance, meloxicam    HPI  Francis Cross is a 61 yo male here to establish care and for a review of his chronic medical problems. This my first time meeting this patient though he is seeing many of my partners prior.  Pt actually came from the hospital this morning as his wife developed a vasculitis type rash and chest pain last night - she is being admitted from ER.  He was seen by the dermatologist last week.  DMII: a1c 6 mos prior was 7.8.  Cannot tolerate metformin due to GI side effects. Was Jardiance but thought it was causing yeast infections - tinea cruris worsened since he is uncircumcised. Insurance no longer covers [place] though does cover the other sglt2 inh invokana and farxiga.  On ace and asa 81mg . Urine microalb nml 6 mos prior. Not on statin.  Sees optho annually. [facility] in [place], last seen 3 mos prior. He has noted blurred vision in some of his right eye occ but eye doctor told him exam was normal. 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  Does have tingling/burning pain in feet for sev yrs - did trial of neurontin at last visit but d/c'd as it didn't help and didn't make it feel very good.  His feet feel a little better than 6 mos prior.   Recurrent Tinea cruris: mainly located to the head of the penis, worsened since uncircumcised.was on nystatin powder then ketoconazole wash. Hopefully will decrease or stop now that coming off SGLT2 inh  HLD: Last lipid panel 6 mos prior was horrible as it had been over the years prior with LDL 211 and a non-hdl of 259. He cannot tolerate statins due to myalgias and weakness so  severe he could not walk. He also failed zetia in 2015  HTN: On lisinopril-hctz - checks BP outside office and has been well controlled. He did not take any of his medications today including his BP meds since he has been at the hospital all night with his wife and came straight from there.  Seasonal allergic conjunctivitis: Ketorolac optho prn. Has follow-up with [facility] soon.  Hypogonadism: On testosterone replacement - his last level at physical 6 mos prior was quite low at 189 then repeat 2-3 wks later was still low at 240 so his Lipoderm 5% testosterone 50mg /ml (from [facility]) dose was increased from 4 clicks (=1 ml = 50mg ) topically qd to 6 clicks qd. Misses dose about 1x/wk on avg. Pt was asked to come-in for repeat level at lab-only visit in mid Nov but never did so.  hgb slightly elev which could be secondary to the testosterone replacement vs due to hemachromatosis carrier.  No h/o elev PSA or prostate problems.  He was seen by [facility] once - Dr. Hartford Poli in 2016 who started pt on Androgel but then his prior PCP took over maintenance of this treatment.  Hands: Sees Dr. Amedeo Plenty.for joint pain in his hands more after working and numbness in some fingers. H/o CTS surgery in 2008.  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 Hemoglobin mildly elevated. No history of liver biopsy or hematology eval. Ferritin last checked 6 months ago was normal at 194 with normal full iron panel October 2016.  HH: sees GI - planning for EGD. On omeprazole qod.  Refuses flu shot  Daughter is a NP that works with hospice in [employer]. She is trying to transfer to be a provider in a [facility].  She requests an updated EKG which I think is reasonable considering his variety of uncontrolled cardiac risk factors.  Review of Systems  Constitutional: Negative for activity change, appetite change,  chills and fever.  Eyes: Negative for visual disturbance.  Respiratory: Negative for shortness of breath.   Cardiovascular: Negative for chest pain.  Gastrointestinal: Positive for abdominal pain. Negative for abdominal distention and blood in stool.  Musculoskeletal: Positive for arthralgias and myalgias. Negative for gait problem.  Skin: Positive for rash.  Neurological: Negative for dizziness, syncope, facial asymmetry, weakness and light-headedness.  Hematological: Does not bruise/bleed easily.  Psychiatric/Behavioral: Negative for sleep disturbance. The patient is not nervous/anxious.        Objective:   Physical Exam  Constitutional: He is oriented to person, place, and time. He appears well-developed and well-nourished. No distress.  HENT:  Head: Normocephalic and atraumatic.  Eyes: Conjunctivae are normal. Pupils are equal, round, and reactive to light. No scleral icterus.  Neck: Normal range of motion. Neck supple. No thyromegaly present.  Cardiovascular: Normal rate, regular rhythm, normal heart sounds and intact distal pulses.   Pulmonary/Chest: Effort normal and breath sounds normal. No respiratory distress.  Musculoskeletal: He exhibits no edema.  Lymphadenopathy:    He has no cervical adenopathy.  Neurological: He is alert and oriented to person, place, and time.  Skin: Skin is warm and dry. He is not diaphoretic.  Psychiatric: He has a normal mood and affect. His behavior is normal.   Diabetic Foot Exam - Simple   Simple Foot Form Diabetic Foot exam was performed with the following findings:  Yes 04/11/2016  8:05 AM  Visual Inspection No deformities, no ulcerations, no other skin breakdown bilaterally:  Yes Sensation Testing Intact to touch and monofilament testing bilaterally:  Yes Pulse Check Posterior Tibialis and Dorsalis pulse intact bilaterally:  Yes Comments     BP (!) 154/90 (BP Location: Right Arm, Patient Position: Sitting, Cuff Size: Large)   Pulse  67   Temp 98 F (36.7 C) (Oral)   Resp 18   Ht 5' 5.5" (1.664 m)   Wt 206 lb (93.4 kg)   SpO2 98%   BMI 33.76 kg/m   [facility] reading (PRIMARY) by  Dr. Brigitte Pulse.   EKG: NSR, no sig change from 10/01/2013 Assessment & Plan:  DO Foot exam - CMA Carrie did Hiv, a1c, test, cbc, cmp - ordered Flu shot - done Refer to endocrine to do both DM and testosterone - pt requests Dr. Buddy Duty who his wife sees. Refer to lipid clinic.  1. Uncontrolled type 2 diabetes mellitus with diabetic neuropathy, without long-term current use of insulin ([place]) - wants to switch off SGLT2inhibitor due to chronic tinea cruris. CBGs quite uncontrolled. Advised patient he will likely require multiple medical medications which he was not excited to hear. Unfortunately he has not had any weight loss on the Jardiance. Will switch to Januvia. He is reluctant to try an injectable though maybe able to be talked into this  as other options cause weight gain. It looks like patient has NOT tried extended release metformin so may want to combine that with Januvia if A1c is still elevated which I expect will be.  Will refer to endocrine Dr. Buddy Duty for further management.   2. Hypogonadism in male - Refill testosterone 6 months when level comes back. However advise patient that I do not feel comfortable managing this high-risk medication especially in setting of 71 uncontrolled cardiovascular risk factors. Advise referral to endocrine and patient agrees. Request Dr. Buddy Duty who his wife also sees.   3. High risk medication use   4. Essential hypertension - cont to monitor outside office, did not take medication today. Recheck in 6 mos.  5. Hyperlipidemia LDL goal <100 - LDL has consistently been above 201 goal <70 due to risk factors of uncontrolled diabetes and testosterone replacement. Refer to lipid clinic to see if patient can be started on PCSK9 inhibitor as he has failed statins and zetia due to severe myalgias and weakness.   6. H/O tinea cruris    7. Class 1 obesity due to excess calories with serious comorbidity and body mass index (BMI) of 33.0 to 33.9 in adult   8. Hemochromatosis carrier - prior ferritin and iron levels normal.   9. Routine screening for STI (sexually transmitted infection)   10. Allergic conjunctivitis, unspecified laterality     Orders Placed This Encounter  Procedures  . CBC  . Comprehensive metabolic panel  . TestT+TestF+SHBG  . HIV antibody  . Ferritin  . Ambulatory referral to Endocrinology    Referral Priority:   Routine    Referral Type:   Consultation    Referral Reason:   Specialty Services Required    Number of Visits Requested:   1  . Ambulatory referral to Lipid Clinic    Referral Priority:   Routine    Referral Type:   Consultation    Referral Reason:   Specialty Services Required    Requested Specialty:   Hematology    Number of Visits Requested:   1  . Care order/instruction:    Scheduling Instructions:     Complete orders, AVS and go.  Marland Kitchen POCT glycosylated hemoglobin (Hb A1C)  . EKG 12-Lead  . HM DIABETES FOOT EXAM    Meds ordered this encounter  Medications  . sitaGLIPtin (JANUVIA) 100 MG tablet    Sig: Take 1 tablet (100 mg total) by mouth daily.    Dispense:  90 tablet    Refill:  1  . lisinopril-hydrochlorothiazide (PRINZIDE,ZESTORETIC) 20-12.5 MG tablet    Sig: Take 1 tablet by mouth daily.    Dispense:  90 tablet    Refill:  1  . nystatin cream (MYCOSTATIN)    Sig: Apply 1 application topically 3 (three) times daily.    Dispense:  30 g    Refill:  3  . ketorolac (ACULAR) 0.5 % ophthalmic solution    Sig: Place 1 drop into both eyes 2 (two) times daily as needed.    Dispense:  5 mL    Refill:  1  . ketoconazole (NIZORAL) 2 % shampoo    Sig: APPLY TOPICALLY 2 (TWO) TIMES A WEEK.    Dispense:  120 mL    Refill:  1      Delman Cheadle, M.D.  [facility] [address] [place], [place] [address] [phone] phone [phone]  fax  04/11/16 9:07 AM

## 2016-04-11 ENCOUNTER — Encounter: Payer: Self-pay | Admitting: Family Medicine

## 2016-04-11 ENCOUNTER — Ambulatory Visit (INDEPENDENT_AMBULATORY_CARE_PROVIDER_SITE_OTHER): Payer: BLUE CROSS/BLUE SHIELD | Admitting: Family Medicine

## 2016-04-11 VITALS — BP 154/90 | HR 67 | Temp 98.0°F | Resp 18 | Ht 65.5 in | Wt 206.0 lb

## 2016-04-11 DIAGNOSIS — E1140 Type 2 diabetes mellitus with diabetic neuropathy, unspecified: Secondary | ICD-10-CM

## 2016-04-11 DIAGNOSIS — Z113 Encounter for screening for infections with a predominantly sexual mode of transmission: Secondary | ICD-10-CM | POA: Diagnosis not present

## 2016-04-11 DIAGNOSIS — Z148 Genetic carrier of other disease: Secondary | ICD-10-CM

## 2016-04-11 DIAGNOSIS — I10 Essential (primary) hypertension: Secondary | ICD-10-CM | POA: Diagnosis not present

## 2016-04-11 DIAGNOSIS — E291 Testicular hypofunction: Secondary | ICD-10-CM | POA: Diagnosis not present

## 2016-04-11 DIAGNOSIS — IMO0002 Reserved for concepts with insufficient information to code with codable children: Secondary | ICD-10-CM

## 2016-04-11 DIAGNOSIS — E6609 Other obesity due to excess calories: Secondary | ICD-10-CM

## 2016-04-11 DIAGNOSIS — Z8619 Personal history of other infectious and parasitic diseases: Secondary | ICD-10-CM

## 2016-04-11 DIAGNOSIS — E785 Hyperlipidemia, unspecified: Secondary | ICD-10-CM | POA: Diagnosis not present

## 2016-04-11 DIAGNOSIS — E1165 Type 2 diabetes mellitus with hyperglycemia: Secondary | ICD-10-CM

## 2016-04-11 DIAGNOSIS — H1010 Acute atopic conjunctivitis, unspecified eye: Secondary | ICD-10-CM

## 2016-04-11 DIAGNOSIS — Z79899 Other long term (current) drug therapy: Secondary | ICD-10-CM

## 2016-04-11 DIAGNOSIS — E66811 Obesity, class 1: Secondary | ICD-10-CM

## 2016-04-11 DIAGNOSIS — Z6833 Body mass index (BMI) 33.0-33.9, adult: Secondary | ICD-10-CM

## 2016-04-11 LAB — POCT GLYCOSYLATED HEMOGLOBIN (HGB A1C): Hemoglobin A1C: 8.2

## 2016-04-11 MED ORDER — LISINOPRIL-HYDROCHLOROTHIAZIDE 20-12.5 MG PO TABS: 1.0000 | ORAL_TABLET | Freq: Every day | ORAL | 1 refills | Status: DC

## 2016-04-11 MED ORDER — NYSTATIN 100000 UNIT/GM EX CREA: 1.0000 "application " | TOPICAL_CREAM | Freq: Three times a day (TID) | CUTANEOUS | 3 refills | Status: DC

## 2016-04-11 MED ORDER — KETOCONAZOLE 2 % EX SHAM: MEDICATED_SHAMPOO | CUTANEOUS | 1 refills | Status: AC

## 2016-04-11 MED ORDER — SITAGLIPTIN PHOSPHATE 100 MG PO TABS: 100.0000 mg | ORAL_TABLET | Freq: Every day | ORAL | 1 refills | Status: DC

## 2016-04-11 MED ORDER — KETOROLAC TROMETHAMINE 0.5 % OP SOLN: 1.0000 [drp] | Freq: Two times a day (BID) | OPHTHALMIC | 1 refills | Status: AC | PRN

## 2016-04-11 MED ORDER — MELOXICAM 15 MG PO TABS: 15.0000 mg | ORAL_TABLET | Freq: Every day | ORAL | 2 refills | Status: DC | PRN

## 2016-04-11 NOTE — Patient Instructions (Addendum)
Go online to the pharmaceutical company who makes Januvia and see if you can get a coupon. You may be able to reduce your co-pay to 0.  Lets get you to Dr. Buddy Duty to manage your diabetes and your testosterone - your testosterone has been below goal for a while so hopefully he can help with this and I suspect you are going to need >1 pill for your diabetes but if after the Januvia the other options either cause weight gain or are injectable.  I will refer you to a lipid clinic to discuss the new non-statin IV cholesterol medication.  Come see me for your complete physical in 6 months.   IF you received an x-ray today, you will receive an invoice from [facility]. Please contact [facility] at [phone] with questions or concerns regarding your invoice.   IF you received labwork today, you will receive an invoice from [facility]. Please contact [facility] at [phone] with questions or concerns regarding your invoice.   Our billing staff will not be able to assist you with questions regarding bills from these companies.  You will be contacted with the lab results as soon as they are available. The fastest way to get your results is to activate your My Chart account. Instructions are located on the last page of this paperwork. If you have not heard from [place] regarding the results in 2 weeks, please contact this office.     Diabetes Mellitus and Standards of Medical Care Managing diabetes (diabetes mellitus) can be complicated. Your diabetes treatment may be managed by a team of health care providers, including:  A diet and nutrition specialist (registered dietitian).  A nurse.  A certified diabetes educator (CDE).  A diabetes specialist (endocrinologist).  An eye doctor.  A primary care provider.  A dentist. Your health care providers follow a schedule in order to help you get the best quality of care. The following schedule is a general guideline for your diabetes  management plan. Your health care providers may also give you more specific instructions. HbA1c ( hemoglobin A1c) test This test provides information about blood sugar (glucose) control over the previous 2-3 months. It is used to check whether your diabetes management plan needs to be adjusted.  If you are meeting your treatment goals, this test is done at least 2 times a year.  If you are not meeting treatment goals or if your treatment goals have changed, this test is done 4 times a year. Blood pressure test  This test is done at every routine medical visit. For most people, the goal is less than 140/90. In some cases, your goal blood pressure may be 130/80 or less. Ask your health care provider what your goal blood pressure should be. Dental and eye exams  Visit your dentist two times a year.  If you have type 1 diabetes, get an eye exam 3-5 years after you are diagnosed, and then once a year after your first exam.  If you were diagnosed with type 1 diabetes as a child, get an eye exam when you are age 42 or older and have had diabetes for 3-5 years. After the first exam, you should get an eye exam once a year.  If you have type 2 diabetes, have an eye exam as soon as you are diagnosed, and then once a year after your first exam. Foot care exam  Visual foot exams are done at every routine medical visit. The exams check for cuts, bruises, redness,  blisters, sores, or other problems with the feet.  A complete foot exam is done by your health care provider once a year. This exam includes an inspection of the structure and skin of your feet, and a check of the pulses and sensation in your feet.  Type 1 diabetes: Get your first exam 3-5 years after diagnosis.  Type 2 diabetes: Get your first exam as soon as you are diagnosed.  Check your feet every day for cuts, bruises, redness, blisters, or sores. If you have any of these or other problems that are not healing, contact your health care  provider. Kidney function test ( urine microalbumin)  This test is done once a year.  Type 1 diabetes: Get your first test 5 years after diagnosis.  Type 2 diabetes: Get your first test as soon as you are diagnosed.  If you have chronic kidney disease (CKD), get a serum creatinine and estimated glomerular filtration rate (eGFR) test once a year. Lipid profile (cholesterol, HDL, LDL, triglycerides)  This test should be done when you are diagnosed with diabetes, and every 5 years after the first test. If you are on medicines to lower your cholesterol, you may need to get this test done every year.  The goal for LDL is less than 100 mg/dL (5.5 mmol/L). If you are at high risk, the goal is less than 70 mg/dL (3.9 mmol/L).  The goal for HDL is 40 mg/dL (2.2 mmol/L) for men and 50 mg/dL(2.8 mmol/L) for women. An HDL cholesterol of 60 mg/dL (3.3 mmol/L) or higher gives some protection against heart disease.  The goal for triglycerides is less than 150 mg/dL (8.3 mmol/L). Immunizations  The yearly flu (influenza) vaccine is recommended for everyone 6 months or older who has diabetes.  The pneumonia (pneumococcal) vaccine is recommended for everyone 2 years or older who has diabetes. If you are 32 or older, you may get the pneumonia vaccine as a series of two separate shots.  The hepatitis B vaccine is recommended for adults shortly after they have been diagnosed with diabetes.  The Tdap (tetanus, diphtheria, and pertussis) vaccine should be given:  According to normal childhood vaccination schedules, for children.  Every 10 years, for adults who have diabetes.  The shingles vaccine is recommended for people who have had chicken pox and are 50 years or older. Mental and emotional health  Screening for symptoms of eating disorders, anxiety, and depression is recommended at the time of diagnosis and afterward as needed. If your screening shows that you have symptoms (you have a positive  screening result), you may need further evaluation and be referred to a mental health care provider. Diabetes self-management education  Education about how to manage your diabetes is recommended at diagnosis and ongoing as needed. Treatment plan  Your treatment plan will be reviewed at every medical visit. Summary  Managing diabetes (diabetes mellitus) can be complicated. Your diabetes treatment may be managed by a team of health care providers.  Your health care providers follow a schedule in order to help you get the best quality of care.  Standards of care including having regular physical exams, blood tests, blood pressure monitoring, immunizations, screening tests, and education about how to manage your diabetes.  Your health care providers may also give you more specific instructions based on your individual health. This information is not intended to replace advice given to you by your health care provider. Make sure you discuss any questions you have with your health care  provider. Document Released: 12/16/2008 Document Revised: 11/17/2015 Document Reviewed: 11/17/2015 Elsevier Interactive Patient Education  2017 [employer].

## 2016-04-11 NOTE — Addendum Note (Signed)
Addended by: Delman Cheadle on: 04/11/2016 01:10 PM   Modules accepted: Orders

## 2016-04-15 LAB — COMPREHENSIVE METABOLIC PANEL
ALT: 30 IU/L (ref 0–44)
AST: 20 IU/L (ref 0–40)
Albumin/Globulin Ratio: 2.2 (ref 1.2–2.2)
Albumin: 4.7 g/dL (ref 3.6–4.8)
Alkaline Phosphatase: 73 IU/L (ref 39–117)
BILIRUBIN TOTAL: 0.4 mg/dL (ref 0.0–1.2)
BUN/Creatinine Ratio: 16 (ref 10–24)
BUN: 15 mg/dL (ref 8–27)
CO2: 22 mmol/L (ref 18–29)
Calcium: 9.6 mg/dL (ref 8.6–10.2)
Chloride: 97 mmol/L (ref 96–106)
Creatinine, Ser: 0.92 mg/dL (ref 0.76–1.27)
GFR calc non Af Amer: 90 mL/min/{1.73_m2} (ref >59)
GFR, EST AFRICAN AMERICAN: 104 mL/min/{1.73_m2} (ref >59)
GLUCOSE: 189 mg/dL — AB (ref 65–99)
Globulin, Total: 2.1 g/dL (ref 1.5–4.5)
Potassium: 4 mmol/L (ref 3.5–5.2)
Sodium: 139 mmol/L (ref 134–144)
TOTAL PROTEIN: 6.8 g/dL (ref 6.0–8.5)

## 2016-04-15 LAB — TESTT+TESTF+SHBG
SEX HORMONE BINDING: 25.5 nmol/L (ref 19.3–76.4)
TESTOSTERONE, TOTAL: 229.9 ng/dL — AB (ref 264.0–916.0)
Testosterone, Free: 5.7 pg/mL — ABNORMAL LOW (ref 6.6–18.1)

## 2016-04-15 LAB — CBC
Hematocrit: 46.7 % (ref 37.5–51.0)
Hemoglobin: 15.8 g/dL (ref 13.0–17.7)
MCH: 30.9 pg (ref 26.6–33.0)
MCHC: 33.8 g/dL (ref 31.5–35.7)
MCV: 91 fL (ref 79–97)
PLATELETS: 170 10*3/uL (ref 150–379)
RBC: 5.11 x10E6/uL (ref 4.14–5.80)
RDW: 13.5 % (ref 12.3–15.4)
WBC: 6.5 10*3/uL (ref 3.4–10.8)

## 2016-04-15 LAB — HIV ANTIBODY (ROUTINE TESTING W REFLEX): HIV SCREEN 4TH GENERATION: NONREACTIVE

## 2016-04-18 ENCOUNTER — Encounter: Payer: Self-pay | Admitting: Gastroenterology

## 2016-04-18 ENCOUNTER — Ambulatory Visit (AMBULATORY_SURGERY_CENTER): Payer: BLUE CROSS/BLUE SHIELD | Admitting: Gastroenterology

## 2016-04-18 VITALS — BP 116/79 | HR 64 | Temp 97.3°F | Resp 13 | Ht 65.0 in | Wt 204.0 lb

## 2016-04-18 DIAGNOSIS — R1013 Epigastric pain: Secondary | ICD-10-CM | POA: Diagnosis present

## 2016-04-18 DIAGNOSIS — K319 Disease of stomach and duodenum, unspecified: Secondary | ICD-10-CM | POA: Diagnosis not present

## 2016-04-18 DIAGNOSIS — R1319 Other dysphagia: Secondary | ICD-10-CM

## 2016-04-18 DIAGNOSIS — R1310 Dysphagia, unspecified: Secondary | ICD-10-CM

## 2016-04-18 DIAGNOSIS — B999 Unspecified infectious disease: Secondary | ICD-10-CM | POA: Diagnosis not present

## 2016-04-18 MED ORDER — SODIUM CHLORIDE 0.9 % IV SOLN: 500.0000 mL | INTRAVENOUS | Status: DC

## 2016-04-18 NOTE — Progress Notes (Signed)
Patient awakening,vss,report to rn 

## 2016-04-18 NOTE — Patient Instructions (Addendum)
YOU HAD AN ENDOSCOPIC PROCEDURE TODAY AT [facility]:   Refer to the procedure report that was given to you for any specific questions about what was found during the examination.  If the procedure report does not answer your questions, please call your gastroenterologist to clarify.  If you requested that your care partner not be given the details of your procedure findings, then the procedure report has been included in a sealed envelope for you to review at your convenience later.  YOU SHOULD EXPECT: Some feelings of bloating in the abdomen. Passage of more gas than usual.  Walking can help get rid of the air that was put into your GI tract during the procedure and reduce the bloating. If you had a lower endoscopy (such as a colonoscopy or flexible sigmoidoscopy) you may notice spotting of blood in your stool or on the toilet paper. If you underwent a bowel prep for your procedure, you may not have a normal bowel movement for a few days.  Please Note:  You might notice some irritation and congestion in your nose or some drainage.  This is from the oxygen used during your procedure.  There is no need for concern and it should clear up in a day or so.  SYMPTOMS TO REPORT IMMEDIATELY:   Following upper endoscopy (EGD)  Vomiting of blood or coffee ground material  New chest pain or pain under the shoulder blades  Painful or persistently difficult swallowing  New shortness of breath  Fever of 100F or higher  Black, tarry-looking stools  For urgent or emergent issues, a gastroenterologist can be reached at any hour by calling [phone].   DIET:  We do recommend a small meal at first, but then you may proceed to your regular diet.  Drink plenty of fluids but you should avoid alcoholic beverages for 24 hours.  ACTIVITY:  You should plan to take it easy for the rest of today and you should NOT DRIVE or use heavy machinery until tomorrow (because of the sedation medicines used  during the test).    FOLLOW UP: Our staff will call the number listed on your records the next business day following your procedure to check on you and address any questions or concerns that you may have regarding the information given to you following your procedure. If we do not reach you, we will leave a message.  However, if you are feeling well and you are not experiencing any problems, there is no need to return our call.  We will assume that you have returned to your regular daily activities without incident.  If any biopsies were taken you will be contacted by phone or by letter within the next 1-3 weeks.  Please call us at [phone] if you have not heard about the biopsies in 3 weeks.    SIGNATURES/CONFIDENTIALITY: You and/or your care partner have signed paperwork which will be entered into your electronic medical record.  These signatures attest to the fact that that the information above on your After Visit Summary has been reviewed and is understood.  Full responsibility of the confidentiality of this discharge information lies with you and/or your care-partner. Your blood sugar was 182 in the recovery room. Minimize NSAID use, since these mess appear to cause this patient dyspepsia. You may resume your other current medications today. Await biopsy results. Please call if any questions or concerns.

## 2016-04-18 NOTE — Progress Notes (Signed)
Called to room to assist during endoscopic procedure.  Patient ID and intended procedure confirmed with present staff. Received instructions for my participation in the procedure from the performing physician.  

## 2016-04-18 NOTE — Progress Notes (Signed)
No problems noted in the recovery room. maw 

## 2016-04-18 NOTE — Op Note (Signed)
[facility] Patient Name: Francis Cross Procedure Date: 04/18/2016 10:07 AM MRN: [id number] Endoscopist: Basehor. Loletha Carrow , MD Age: 61 Referring MD:  Date of Birth: [birth year] Gender: Male Account #: [id number] Procedure:                Upper GI endoscopy Indications:              Epigastric abdominal pain, Dysphagia Medicines:                Monitored Anesthesia Care Procedure:                Pre-Anesthesia Assessment:                           - Prior to the procedure, a History and Physical                            was performed, and patient medications and                            allergies were reviewed. The patient's tolerance of                            previous anesthesia was also reviewed. The risks                            and benefits of the procedure and the sedation                            options and risks were discussed with the patient.                            All questions were answered, and informed consent                            was obtained. Anticoagulants: The patient has taken                            aspirin. It was decided not to withhold this                            medication prior to the procedure. ASA Grade                            Assessment: II - A patient with mild systemic                            disease. After reviewing the risks and benefits,                            the patient was deemed in satisfactory condition to                            undergo the procedure.  After obtaining informed consent, the endoscope was                            passed under direct vision. Throughout the                            procedure, the patient's blood pressure, pulse, and                            oxygen saturations were monitored continuously. The                            Model [id number]-[id number]) scope was introduced                            through the mouth, and advanced to the second  part                            of duodenum. The upper GI endoscopy was                            accomplished without difficulty. The patient                            tolerated the procedure well. Scope In: Scope Out: Findings:                 The esophagus was normal.                           Diffuse atrophic mucosa was found in the gastric                            antrum. Biopsies were taken with a cold forceps for                            histology.                           The cardia and gastric fundus were normal on                            retroflexion.                           The examined duodenum was normal. Complications:            No immediate complications. Estimated Blood Loss:     Estimated blood loss: none. Impression:               - Normal esophagus.                           - Gastric mucosal atrophy. Biopsied.                           - Normal examined duodenum. Recommendation:           -  Patient has a contact number available for                            emergencies. The signs and symptoms of potential                            delayed complications were discussed with the                            patient. Return to normal activities tomorrow.                            Written discharge instructions were provided to the                            patient.                           - Resume previous diet.                           - Continue present medications.                           - Await pathology results.                           - Minimize NSAID use, since these meds appear to                            cause this patient dyspepsia. [name], MD 04/18/2016 10:29:18 AM This report has been signed electronically.

## 2016-04-24 ENCOUNTER — Other Ambulatory Visit: Payer: Self-pay

## 2016-04-24 ENCOUNTER — Telehealth: Payer: Self-pay | Admitting: Gastroenterology

## 2016-04-24 MED ORDER — OMEPRAZOLE 20 MG PO CPDR: 20.0000 mg | DELAYED_RELEASE_CAPSULE | Freq: Every day | ORAL | 0 refills | Status: DC

## 2016-04-24 MED ORDER — BIS SUBCIT-METRONID-TETRACYC 140-125-125 MG PO CAPS: 3.0000 | ORAL_CAPSULE | Freq: Three times a day (TID) | ORAL | 0 refills | Status: DC

## 2016-04-25 NOTE — Telephone Encounter (Signed)
Left a message for pt that sample of pylera was left at the front desk for pick up.

## 2016-04-28 ENCOUNTER — Encounter: Payer: Self-pay | Admitting: Family Medicine

## 2016-05-06 ENCOUNTER — Encounter: Payer: Self-pay | Admitting: Family Medicine

## 2016-05-08 ENCOUNTER — Telehealth: Payer: Self-pay | Admitting: Gastroenterology

## 2016-05-08 NOTE — Telephone Encounter (Signed)
Called patient back let him know that I am mailing an order and letter detailing where and when to do this urea breath test. Francis Cross over all directions on phone with him.

## 2016-05-10 MED ORDER — UNABLE TO FIND: 4 refills | Status: DC

## 2016-05-13 MED ORDER — UNABLE TO FIND: 4 refills | Status: DC

## 2016-05-13 NOTE — Addendum Note (Signed)
Addended by: Delman Cheadle on: 05/13/2016 01:47 PM   Modules accepted: Orders

## 2016-05-20 ENCOUNTER — Encounter: Payer: Self-pay | Admitting: Gastroenterology

## 2016-05-24 ENCOUNTER — Telehealth: Payer: Self-pay | Admitting: Gastroenterology

## 2016-05-24 NOTE — Telephone Encounter (Signed)
Patient advised of H. Pylori test results.

## 2016-05-24 NOTE — Telephone Encounter (Signed)
[facility] results show that the H. Pylori stomach bacteria has cleared with the antibiotic treatment.

## 2016-06-25 ENCOUNTER — Encounter: Payer: Self-pay | Admitting: Family Medicine

## 2016-06-25 LAB — HM DIABETES EYE EXAM

## 2016-07-08 ENCOUNTER — Other Ambulatory Visit: Payer: Self-pay | Admitting: Family Medicine

## 2016-07-08 NOTE — Telephone Encounter (Signed)
LOV and med refill on 2/8/8

## 2016-07-09 NOTE — Telephone Encounter (Signed)
Please check with [facility] and see if they have the rx I printed on 3/21 - I assume this was faxed/called by someone but who knows - it was a 5 mo refill on his testosterone. If they do not have it, please gie them a verbal order for this rx. Thanks.

## 2016-07-16 ENCOUNTER — Ambulatory Visit (INDEPENDENT_AMBULATORY_CARE_PROVIDER_SITE_OTHER): Payer: BLUE CROSS/BLUE SHIELD | Admitting: Cardiovascular Disease

## 2016-07-16 VITALS — BP 142/93 | HR 64 | Ht 65.0 in | Wt 210.0 lb

## 2016-07-16 DIAGNOSIS — R0789 Other chest pain: Secondary | ICD-10-CM

## 2016-07-16 DIAGNOSIS — I10 Essential (primary) hypertension: Secondary | ICD-10-CM

## 2016-07-16 DIAGNOSIS — E785 Hyperlipidemia, unspecified: Secondary | ICD-10-CM | POA: Diagnosis not present

## 2016-07-16 LAB — LIPID PANEL
CHOL/HDL RATIO: 4.9 ratio (ref <5.0)
CHOLESTEROL: 232 mg/dL — AB (ref <200)
HDL: 47 mg/dL (ref >40)
LDL Cholesterol: 156 mg/dL — ABNORMAL HIGH (ref <100)
Triglycerides: 143 mg/dL (ref <150)
VLDL: 29 mg/dL (ref <30)

## 2016-07-16 LAB — HEPATIC FUNCTION PANEL
ALT: 44 U/L (ref 9–46)
AST: 28 U/L (ref 10–35)
Albumin: 4.4 g/dL (ref 3.6–5.1)
Alkaline Phosphatase: 62 U/L (ref 40–115)
BILIRUBIN DIRECT: 0.1 mg/dL (ref <=0.2)
Indirect Bilirubin: 0.4 mg/dL (ref 0.2–1.2)
Total Bilirubin: 0.5 mg/dL (ref 0.2–1.2)
Total Protein: 6.8 g/dL (ref 6.1–8.1)

## 2016-07-16 NOTE — Progress Notes (Signed)
Cardiology Office Note   Date:  07/16/2016   ID:  Francis Cross, DOB Jul 31, 1955, MRN [id number]  PCP:  Shawnee Knapp, MD  Cardiologist:   Kathlyn Sacramento, MD   Chief Complaint  Patient presents with  . Follow-up    pt denied chest pain SOB      History of Present Illness: Francis Cross is a 61 y.o. male who was referred by Dr. Brigitte Pulse for evaluation of Severe hyperlipidemia. He has known history of hypertension, hyperlipidemia and type 2 diabetes. He was seen by me in 2015 for atypical chest pain. He underwent a nuclear stress test which showed no evidence of ischemia with normal ejection fraction. He had few episodes of brief left-sided chest pain recently described as twinges lasting for a few seconds. These episodes are random and they do not happen with physical activities. Otherwise he has no shortness of breath, orthopnea or PND. He has prolonged history of hyperlipidemia with LDL above 100. He has tried multiple statins in the past including a statin and pravastatin but did not take these medications even a small dose is due to severe myalgia. He was also tried on Zetia which caused the same thing. He is not aware of family history of hyperlipidemia. However, both parents died at relatively young age from cancer.   Past Medical History:  Diagnosis Date  . Acid reflux   . Cancer ([place])    skin cancere  . Diabetes mellitus without complication ([place])   . Erectile dysfunction   . Gout   . Hemochromatosis   . Hiatal hernia   . Hyperlipidemia   . Hypertension   . Iron overload   . Obesity     Past Surgical History:  Procedure Laterality Date  . APPENDECTOMY    . CARDIAC CATHETERIZATION     [place]    . TONSILLECTOMY       Current Outpatient Prescriptions  Medication Sig Dispense Refill  . aspirin 81 MG tablet Take 81 mg by mouth daily.    Marland Kitchen bismuth-metronidazole-tetracycline (PYLERA) 140-125-125 MG capsule Take 3 capsules by mouth 4 (four) times daily -   before meals and at bedtime. 120 capsule 0  . ketoconazole (NIZORAL) 2 % shampoo APPLY TOPICALLY 2 (TWO) TIMES A WEEK. 120 mL 1  . ketorolac (ACULAR) 0.5 % ophthalmic solution Place 1 drop into both eyes 2 (two) times daily as needed. 5 mL 1  . lisinopril-hydrochlorothiazide (PRINZIDE,ZESTORETIC) 20-12.5 MG tablet Take 1 tablet by mouth daily. 90 tablet 1  . meloxicam (MOBIC) 15 MG tablet Take 1 tablet (15 mg total) by mouth daily as needed for pain. Will need office visit for any additional refills 30 tablet 2  . nystatin cream (MYCOSTATIN) Apply 1 application topically 3 (three) times daily. 30 g 3  . omeprazole (PRILOSEC) 20 MG capsule Take 1 capsule (20 mg total) by mouth daily. 20 capsule 0  . sitaGLIPtin (JANUVIA) 100 MG tablet Take 1 tablet (100 mg total) by mouth daily. 90 tablet 1  . triamcinolone cream (KENALOG) 0.1 %     . UNABLE TO FIND TESTOSTERONE LIPODERM 5% (50 MG/ML) CREAM. Apply 2 ml (8 clicks) topically once a day as directed. 60 mL 4   Current Facility-Administered Medications  Medication Dose Route Frequency Provider Last Rate Last Dose  . 0.9 %  sodium chloride infusion  500 mL Intravenous Continuous Doran Stabler, MD        Allergies:   Cephalexin and  Penicillins    Social History:  The patient  reports that he has quit smoking. He has quit using smokeless tobacco. He reports that he drinks alcohol. He reports that he does not use drugs.   Family History:  The patient's family history includes Cancer in his father and mother; Hyperlipidemia in his sister; Hypertension in his sister.    ROS:  Please see the history of present illness.   Otherwise, review of systems are positive for none.   All other systems are reviewed and negative.    PHYSICAL EXAM: VS:  BP (!) 142/93   Pulse 64   Ht 5\' 5"  (1.651 m)   Wt 210 lb (95.3 kg)   BMI 34.95 kg/m  , BMI Body mass index is 34.95 kg/m. GEN: Well nourished, well developed, in no acute distress  HEENT: normal    Neck: no JVD, carotid bruits, or masses Cardiac: RRR; no murmurs, rubs, or gallops,no edema  Respiratory:  clear to auscultation bilaterally, normal work of breathing GI: soft, nontender, nondistended, + BS MS: no deformity or atrophy  Skin: warm and dry, no rash Neuro:  Strength and sensation are intact Psych: euthymic mood, full affect   EKG:  EKG is not ordered today. I reviewed EKG from February which showed normal sinus rhythm with no significant ST or T wave changes.   Recent Labs: 09/21/2015: Hemoglobin 17.4; TSH 1.63 04/11/2016: ALT 30; BUN 15; Creatinine, Ser 0.92; Platelets 170; Potassium 4.0; Sodium 139    Lipid Panel    Component Value Date/Time   CHOL 306 (H) 09/21/2015 1052   TRIG 240 (H) 09/21/2015 1052   HDL 47 09/21/2015 1052   CHOLHDL 6.5 (H) 09/21/2015 1052   VLDL 48 (H) 09/21/2015 1052   LDLCALC 211 (H) 09/21/2015 1052   LDLDIRECT 152.1 10/12/2008 0726      Wt Readings from Last 3 Encounters:  07/16/16 210 lb (95.3 kg)  04/18/16 204 lb (92.5 kg)  04/11/16 206 lb (93.4 kg)       No flowsheet data found.    ASSESSMENT AND PLAN:  1.  Atypical chest pain: Seems to be musculoskeletal. He has no anginal symptoms. Continue medical therapy. Stress test in 2015 was normal.  2. Severe mixed hyperlipidemia : I reviewed most recent lipid profile done in July which showed an LDL of 201 and triglyceride of 240. The patient is intolerant to statins and Zetia due to severe myalgia. Given his diabetes, he benefits from lowering his LDL below 100. I discussed different management options and I think the best option is a PCSK 9 inhibitor. I requested lipid and liver profile and referred the patient to our lipid clinic. Coronary calcium score would be a good option to evaluate the presence of atherosclerosis if needed.  3. Essential hypertension: The pressure is mildly elevated. We can consider increasing lisinopril hydrochlorothiazide if this continues to be the  trend.   Disposition:   FU with me in 1 year  Signed,  Kathlyn Sacramento, MD  07/16/2016 9:51 AM    [place]

## 2016-07-16 NOTE — Patient Instructions (Addendum)
Medication Instructions:  Your physician recommends that you continue on your current medications as directed. Please refer to the Current Medication list given to you today.  Labwork: Your physician recommends that you have lab work today: LIPID and LIVER profile   Testing/Procedures: No new orders.   Follow-Up: Your physician wants you to follow-up in: 1 YEAR with Dr Fletcher Anon. You will receive a reminder letter in the mail two months in advance. If you don't receive a letter, please call our office to schedule the follow-up appointment.   Any Other Special Instructions Will Be Listed Below (If Applicable).  You have been referred to the [facility] for PCSK9 inhibitor evaluation.      If you need a refill on your cardiac medications before your next appointment, please call your pharmacy.

## 2016-07-24 ENCOUNTER — Ambulatory Visit (INDEPENDENT_AMBULATORY_CARE_PROVIDER_SITE_OTHER): Payer: BLUE CROSS/BLUE SHIELD | Admitting: Pharmacist Clinician (PhC)/ Clinical Pharmacy Specialist

## 2016-07-24 DIAGNOSIS — E7800 Pure hypercholesterolemia, unspecified: Secondary | ICD-10-CM | POA: Diagnosis not present

## 2016-07-24 NOTE — Progress Notes (Signed)
07/24/2016 Francis Cross 1955/03/10 [id number]   HPI:  Francis Cross is a 61 y.o. male patient of Dr Fletcher Anon, who presents today for a lipid clinic evaluation.  He was referred originally by Dr. Brigitte Pulse to our practice for hyperlipidemia.  His medical history is also significant for hypertension and type 2 diabetes.  His last A1c was 8.1, he admits that it has been less controlled since his insurance switched him from [place] to [place].    He has tried multiple medications to lower his cholesterol, unfortunately they caused muscle pain and fatigue.  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  His baseline LDL cholesterol was 211.  He is unable to share any information on his father's family history, as his father died at the age of 53 from lung cancer.  Patient was only 20 at the time.   Current Medications:  None  Cholesterol Goals:   LDL < 100 (primary prevention)  Intolerant/previously tried:  Rosuvastatin 10 mg daily (2-4 weeks) in July 2015  Ezetimibe 10 mg daily (2-4 weeks, muscle pain/fatigue, stumbling with all) in August 2015  Pravastatin was on for 3-4 years until muscle pain, unable to walk short distances without pain), joints in hands/fingers - stopped in 2015  Family history:   Father - (49) lung cancer  Mother (61) melanoma  3 siblings - 2 with hyperlipidemia  Diet:   Mostly home cooked meals, some white foods, not regularly; lot of chicken, pork; not much beef; all forms of vegetables, wife cans from garden; grows potatoes both sweet and white; switched to avocado oil, no more fried foods, has been trying to eat better to get cholesterol levels down  Exercise:    Work around home, has 1 acre garden,  (2 acres total home) Labs:   May 2018 - TC 232, TG 143, HDL 47, LDL 156  July 2017 - TC 306, TG 240, HDL 47, LDL 211  April 2017 - TC 279, TG 117, HDL 52, LDL 204  Current Outpatient Prescriptions  Medication Sig  Dispense Refill  . aspirin 81 MG tablet Take 81 mg by mouth daily.    Marland Kitchen bismuth-metronidazole-tetracycline (PYLERA) 140-125-125 MG capsule Take 3 capsules by mouth 4 (four) times daily -  before meals and at bedtime. 120 capsule 0  . ketoconazole (NIZORAL) 2 % shampoo APPLY TOPICALLY 2 (TWO) TIMES A WEEK. 120 mL 1  . ketorolac (ACULAR) 0.5 % ophthalmic solution Place 1 drop into both eyes 2 (two) times daily as needed. 5 mL 1  . lisinopril-hydrochlorothiazide (PRINZIDE,ZESTORETIC) 20-12.5 MG tablet Take 1 tablet by mouth daily. 90 tablet 1  . meloxicam (MOBIC) 15 MG tablet Take 1 tablet (15 mg total) by mouth daily as needed for pain. Will need office visit for any additional refills 30 tablet 2  . nystatin cream (MYCOSTATIN) Apply 1 application topically 3 (three) times daily. 30 g 3  . omeprazole (PRILOSEC) 20 MG capsule Take 1 capsule (20 mg total) by mouth daily. 20 capsule 0  . sitaGLIPtin (JANUVIA) 100 MG tablet Take 1 tablet (100 mg total) by mouth daily. 90 tablet 1  . triamcinolone cream (KENALOG) 0.1 %     . UNABLE TO FIND TESTOSTERONE LIPODERM 5% (50 MG/ML) CREAM. Apply 2 ml (8 clicks) topically once a day as directed. 60 mL 4   Current Facility-Administered Medications  Medication Dose Route Frequency Provider Last Rate Last Dose  . 0.9 %  sodium chloride infusion  500 mL Intravenous Continuous Doran Stabler, MD        Allergies  Allergen Reactions  . Cephalexin     REACTION: Rash  . Penicillins     REACTION: Rash    Past Medical History:  Diagnosis Date  . Acid reflux   . Cancer ([place])    skin cancere  . Diabetes mellitus without complication ([place])   . Erectile dysfunction   . Gout   . Hemochromatosis   . Hiatal hernia   . Hyperlipidemia   . Hypertension   . Iron overload   . Obesity     There were no vitals taken for this visit.   Hypercholesteremia Patient with heterozygous familial hyperlipidemia, with baseline LDL of 211.  Had attempted statin  therapy but was unable to tolerated due to muscle pain and fatigue.  Will start with Repatha 420 mg monthly Pushtronix.  Patient on [employer], so he was given a copay card from the manufacturer.  Will submit paperwork to [place].   Tommy Medal PharmD CPP [facility]

## 2016-07-24 NOTE — Assessment & Plan Note (Signed)
Patient with heterozygous familial hyperlipidemia, with baseline LDL of 211.  Had attempted statin therapy but was unable to tolerated due to muscle pain and fatigue.  Will start with Repatha 420 mg monthly Pushtronix.  Patient on [employer], so he was given a copay card from the manufacturer.  Will submit paperwork to [facility].

## 2016-07-24 NOTE — Patient Instructions (Signed)
Evolocumab injection What is this medicine? EVOLOCUMAB (e voe LOK ue mab) is known as a PCSK9 inhibitor. It is used to lower the level of cholesterol in the blood. It may be used alone or in combination with other cholesterol-lowering drugs. This drug may also be used to reduce the risk of heart attack, stroke, and certain types of heart surgery in patients with heart disease. This medicine may be used for other purposes; ask your health care provider or pharmacist if you have questions. COMMON BRAND NAME(S): REPATHA What should I tell my health care provider before I take this medicine? They need to know if you have any of these conditions: -an unusual or allergic reaction to evolocumab, other medicines, foods, dyes, or preservatives -pregnant or trying to get pregnant -breast-feeding How should I use this medicine? This medicine is for injection under the skin. You will be taught how to prepare and give this medicine. Use exactly as directed. Take your medicine at regular intervals. Do not take your medicine more often than directed. It is important that you put your used needles and syringes in a special sharps container. Do not put them in a trash can. If you do not have a sharps container, call your pharmacist or health care provider to get one. Talk to your pediatrician regarding the use of this medicine in children. While this drug may be prescribed for children as young as 13 years for selected conditions, precautions do apply. Overdosage: If you think you have taken too much of this medicine contact a poison control center or emergency room at once. NOTE: This medicine is only for you. Do not share this medicine with others. What if I miss a dose? If you miss a dose, take it as soon as you can if there are more than 7 days until the next scheduled dose, or skip the missed dose and take the next dose according to your original schedule. Do not take double or extra doses. What may interact  with this medicine? Interactions are not expected. This list may not describe all possible interactions. Give your health care provider a list of all the medicines, herbs, non-prescription drugs, or dietary supplements you use. Also tell them if you smoke, drink alcohol, or use illegal drugs. Some items may interact with your medicine. What should I watch for while using this medicine? You may need blood work while you are taking this medicine. What side effects may I notice from receiving this medicine? Side effects that you should report to your doctor or health care professional as soon as possible: -allergic reactions like skin rash, itching or hives, swelling of the face, lips, or tongue -signs and symptoms of infection like fever or chills; cough; sore throat; pain or trouble passing urine Side effects that usually do not require medical attention (report to your doctor or health care professional if they continue or are bothersome): -diarrhea -nausea -muscle pain -pain, redness, or irritation at site where injected This list may not describe all possible side effects. Call your doctor for medical advice about side effects. You may report side effects to FDA at 1-800-FDA-1088. Where should I keep my medicine? Keep out of the reach of children. You will be instructed on how to store this medicine. Throw away any unused medicine after the expiration date on the label. NOTE: This sheet is a summary. It may not cover all possible information. If you have questions about this medicine, talk to your doctor, pharmacist, or health care   provider.  2018 Elsevier/Gold Standard (2016-02-05 13:21:53)  

## 2016-07-26 ENCOUNTER — Other Ambulatory Visit: Payer: Self-pay | Admitting: Pharmacist Clinician (PhC)/ Clinical Pharmacy Specialist

## 2016-07-26 MED ORDER — EVOLOCUMAB WITH INFUSOR 420 MG/3.5ML ~~LOC~~ SOCT: 420.0000 mg | SUBCUTANEOUS | 12 refills | Status: DC

## 2016-07-31 ENCOUNTER — Encounter: Payer: Self-pay | Admitting: Pharmacist Clinician (PhC)/ Clinical Pharmacy Specialist

## 2016-08-26 ENCOUNTER — Telehealth: Payer: Self-pay

## 2016-08-26 NOTE — Telephone Encounter (Signed)
PA has been approved for Repatha. Coverage is from 08/24/2016 to 08/24/2017.

## 2016-08-27 ENCOUNTER — Ambulatory Visit (INDEPENDENT_AMBULATORY_CARE_PROVIDER_SITE_OTHER): Payer: BLUE CROSS/BLUE SHIELD | Admitting: Family Medicine

## 2016-08-27 ENCOUNTER — Encounter: Payer: Self-pay | Admitting: Cardiovascular Disease

## 2016-08-27 ENCOUNTER — Encounter: Payer: Self-pay | Admitting: Family Medicine

## 2016-08-27 VITALS — BP 135/83 | HR 64 | Temp 98.6°F | Resp 17 | Ht 67.0 in | Wt 208.0 lb

## 2016-08-27 DIAGNOSIS — E785 Hyperlipidemia, unspecified: Secondary | ICD-10-CM | POA: Diagnosis not present

## 2016-08-27 DIAGNOSIS — E1165 Type 2 diabetes mellitus with hyperglycemia: Secondary | ICD-10-CM | POA: Diagnosis not present

## 2016-08-27 DIAGNOSIS — I10 Essential (primary) hypertension: Secondary | ICD-10-CM

## 2016-08-27 DIAGNOSIS — E291 Testicular hypofunction: Secondary | ICD-10-CM

## 2016-08-27 MED ORDER — DAPAGLIFLOZIN PROPANEDIOL 5 MG PO TABS: 5.0000 mg | ORAL_TABLET | Freq: Every day | ORAL | 3 refills | Status: DC

## 2016-08-27 MED ORDER — SITAGLIPTIN PHOSPHATE 100 MG PO TABS: 100.0000 mg | ORAL_TABLET | Freq: Every day | ORAL | 0 refills | Status: DC

## 2016-08-27 NOTE — Progress Notes (Addendum)
By signing my name below, I, [name], attest that this documentation has been prepared under the direction and in the presence of [name], MD.  Electronically Signed: [name], Medical Scribe. 08/27/16. 9:29 AM.  Subjective:    Patient ID: Francis Cross, male    DOB: 1955-12-17, 61 y.o.   MRN: [id number]  HPI Chief Complaint  Patient presents with  . Diabetes    HPI Comments: Francis Cross is a 61 y.o. male with a PMHx of DM, hypogonadism who presents to Primary Care at [facility] for follow-up. He was last seen by me in July 2017. Multiple emails since that time. Initially asked him to follow-up in 1 month due to multiple concerns that were addressed, as well as a visit for a physical at that time. He was seen by Dr. Brigitte Pulse on Feb 8th.  DM: Thought to have diabetic neuropathy. I treated him with neurontin in the past but apparently per previous notes it didn't help and he didn't like the way it made him feel. Foot sxs were better at the Feb visit. He did not tolerate SGLT2 due to chronic tinea. He was changed to [place]. Discussed option of extended release metformin and referred to endocrinology, Dr. Buddy Duty. 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 Followed by [facility] in [place], with reported nl exams. He does take ASA 81 mg QD. His blood sugar runs around 200, 220, 231, but in the beginning it was around the 130s. Pt is compliant with [place]. Pt had "bad" diarrhea with abdominal cramping when he was on 1000 metformin QD. He started on 500 mg metformin and thinks had similar sxs. Pt's hasn't had tinea cruris since getting off of SGLT2, he suspects his wife was giving him yeast infections during intercourse since she was on a DM medication that caused her to have yeast infections. Pt would like to try farxiga since it is on his plan. Pt discontinued gabapentin since it was making him depressed and did not feel well. Pt is still experiencing less tingling in his  foot. Pt doesn't exercises daily and reports difficulty loosing weight below 200 lbs. Pt saw his ophthalmologist 6-8 weeks ago, no diabetic retinopathy. Reports seeing light flashes before his scheduled ophthalmologist appts, and was told they'll go away on their own in a month, but he's still seeing them, especially at night.   When it started it looked like lightening out side and reports a "spidery vision". He does not see them in the morning and it's not worsening, but consistent. Denies HA, blurry vision, dark spots in his vision, no weakness in extremities.. Pt is followed by a dentist regularly.   Lab Results  Component Value Date   HGBA1C 8.2 04/11/2016   Lab Results  Component Value Date   MICROALBUR 0.5 09/21/2015   Wt Readings from Last 3 Encounters:  08/27/16 208 lb (94.3 kg)  07/16/16 210 lb (95.3 kg)  04/18/16 204 lb (92.5 kg)   Hypogonadism: He was continued on testosterone. Level was 229 on Feb 8th with low free testosterone of 5.7, CBC ok. He takes testosterone lipoderm 5% cream, 50 mg per ml, 2 ml topically QD. This was last rx in March with 4 refills. Dr. Raul Del plan was to refill medicine for 6 months but for endocrinology to assume care, referred to Dr. Buddy Duty. Pt forgets to use his cream 1-2x a week, but other wise he uses 8 clicks per day.   Lab Results  Component Value  Date   PSA 0.80 09/21/2015   PSA 0.69 09/20/2013   PSA 0.76 06/07/2012   HLD: He has tried muliple statins in the past causing muscle pain and fatigue. He was seen by cardiology May 23rd. 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 Recommended repatha 420 mg monthly pushtronex. Pt hasn't had repatha since it was being sent to the wrong location.  Lab Results  Component Value Date   CHOL 232 (H) 07/16/2016   HDL 47 07/16/2016   LDLCALC 156 (H) 07/16/2016   LDLDIRECT 152.1 10/12/2008   TRIG 143 07/16/2016   CHOLHDL 4.9 07/16/2016   Lab Results  Component Value Date   ALT 44 07/16/2016   AST 28 07/16/2016     ALKPHOS 62 07/16/2016   BILITOT 0.5 07/16/2016   Hemachromatosis Carrier:  Lab Results  Component Value Date   WBC 6.5 04/11/2016   HGB 15.8 04/11/2016   HCT 46.7 04/11/2016   MCV 91 04/11/2016   PLT 170 04/11/2016   HTN: Takes lisinopril-HCTZ 20/12.5 mg QD. Denies chest pain, light-headedness, HA, or other negative side effects.. Lab Results  Component Value Date   CREATININE 0.92 04/11/2016   BP Readings from Last 3 Encounters:  08/27/16 135/83  07/16/16 (!) 142/93  04/18/16 116/79   Hernia: Repots feeling a twinge where his hernia is but it goes back in place after a while.  Patient Active Problem List   Diagnosis Date Noted  . Hx of carpal tunnel repair 12/22/2015  . Testosterone deficiency 05/23/2014  . Pain in the chest 10/01/2013  . GOUT, UNSPECIFIED 01/29/2010  . Obesity 12/06/2008  . IRON OVERLOAD 08/11/2007  . HIATAL HERNIA WITH REFLUX 07/08/2006  . Hypercholesteremia 06/12/2006  . HYPERTENSION 06/12/2006  . 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 Acid reflux   . Cancer ([place])    skin cancere  . Diabetes mellitus without complication ([place])   . Erectile dysfunction   . Gout   . Hemochromatosis   . Hiatal hernia   . Hyperlipidemia   . Hypertension   . Iron overload   . Obesity    Past Surgical History:  Procedure Laterality Date  . APPENDECTOMY    . CARDIAC CATHETERIZATION     [place]    . TONSILLECTOMY     Allergies  Allergen Reactions  . Cephalexin     REACTION: Rash  . Penicillins     REACTION: Rash   Prior to Admission medications   Medication Sig Start Date End Date Taking? Authorizing Provider  aspirin 81 MG tablet Take 81 mg by mouth daily.    [provider]  bismuth-metronidazole-tetracycline [facility]) [phone] MG capsule Take 3 capsules by mouth 4 (four) times daily -  before meals and at bedtime. 04/24/16   Doran Stabler, MD  Evolocumab with Infusor  ([place]) 420 MG/3.5ML SOCT Inject 420 mg into the skin every 30 (thirty) days. 07/26/16   Wellington Hampshire, MD  ketoconazole (NIZORAL) 2 % shampoo APPLY TOPICALLY 2 (TWO) TIMES A WEEK. 04/11/16   Shawnee Knapp, MD  ketorolac (ACULAR) 0.5 % ophthalmic solution Place 1 drop into both eyes 2 (two) times daily as needed. 04/11/16   Shawnee Knapp, MD  lisinopril-hydrochlorothiazide (PRINZIDE,ZESTORETIC) 20-12.5 MG tablet Take 1 tablet by mouth daily. 04/11/16   Shawnee Knapp, MD  meloxicam (MOBIC) 15 MG tablet Take 1 tablet (15 mg total) by mouth daily as needed for pain.  Will need office visit for any additional refills 07/09/16   Shawnee Knapp, MD  nystatin cream (MYCOSTATIN) Apply 1 application topically 3 (three) times daily. 04/11/16   Shawnee Knapp, MD  omeprazole (PRILOSEC) 20 MG capsule Take 1 capsule (20 mg total) by mouth daily. 04/24/16   Doran Stabler, MD  sitaGLIPtin (JANUVIA) 100 MG tablet Take 1 tablet (100 mg total) by mouth daily. 04/11/16   Shawnee Knapp, MD  triamcinolone cream (KENALOG) 0.1 %  04/04/16   [provider]  UNABLE TO FIND TESTOSTERONE LIPODERM 5% (50 MG/ML) CREAM. Apply 2 ml (8 clicks) topically once a day as directed. 05/13/16   Shawnee Knapp, MD   Social History   Social History  . Marital status: Married    Spouse name: N/A  . Number of children: 3  . Years of education: N/A   Occupational History  . retired    Social History Main Topics  . Smoking status: Former Research scientist (life sciences)  . Smokeless tobacco: Former Systems developer  . Alcohol use Yes     Comment: occ beer  . Drug use: No  . Sexual activity: Not on file   Other Topics Concern  . Not on file   Social History Narrative  . No narrative on file   Review of Systems  Constitutional: Negative for fatigue and unexpected weight change.  HENT: Negative for dental problem.   Eyes: Positive for visual disturbance.  Respiratory: Negative for cough, chest tightness and shortness of breath.   Cardiovascular: Negative  for chest pain, palpitations and leg swelling.  Gastrointestinal: Negative for abdominal pain, blood in stool and diarrhea.  Genitourinary: Negative for discharge.  Neurological: Negative for dizziness, light-headedness and headaches.   Objective:  Physical Exam  Constitutional: He is oriented to person, place, and time. He appears well-developed and well-nourished.  HENT:  Head: Normocephalic and atraumatic.  Eyes: EOM are normal. Pupils are equal, round, and reactive to light.  Neck: No JVD present. Carotid bruit is not present.  Cardiovascular: Normal rate and regular rhythm.  Exam reveals no gallop and no friction rub.   No murmur heard. Pulmonary/Chest: Effort normal and breath sounds normal. No respiratory distress. He has no wheezes. He has no rales.  Abdominal: Soft. He exhibits no distension. There is no tenderness.  Rectus diastasis on the mid abdomen Easily reduces  Musculoskeletal: He exhibits no edema.  Neurological: He is alert and oriented to person, place, and time.  Skin: Skin is warm and dry.  Psychiatric: He has a normal mood and affect.  Vitals reviewed.   Vitals:   08/27/16 0914  BP: 135/83  Pulse: 64  Resp: 17  Temp: 98.6 F (37 C)  TempSrc: Oral  SpO2: 98%  Weight: 208 lb (94.3 kg)  Height: 5\' 7"  (1.702 m)   Body mass index is 32.58 kg/m. Assessment & Plan:   Francis Cross is a 61 y.o. male Type 2 diabetes mellitus with hyperglycemia, without long-term current use of insulin ([place]) - Plan: dapagliflozin propanediol (FARXIGA) 5 MG TABS tablet, sitaGLIPtin (JANUVIA) 100 MG tablet, Hemoglobin A1c  - Uncontrolled by last A1c as well as home readings now worse. Additional options were discussed including GLP1 as well as other SGLT 2. Because he had overall been well on chart and some but was not covered as well by insurance, would like to try different agent and same class. Will add Farxiga 5 mg daily, continue Januvia 100 mg daily, recheck levels in 3 months.  Also recommended follow-up with nephrologist as previously recommended by Dr. Brigitte Pulse.  Prevention strategies for tinea cruris were discussed, but also can use over-the-counter treatment if mild symptoms. RTC precautions given  Hypogonadism in male - Plan: Testosterone, PSA, Testosterone, Free, Total, SHBG, CBC  - Occasional missed doses of testosterone cream, but will check levels, PSA, CBC for monitoring.  -Recommended follow-up with endocrinologist as discussed previously.  Essential hypertension - Plan: Comprehensive metabolic panel   -Stable. Continue same dose of lisinopril HCTZ, CMP pending.  Hyperlipidemia, unspecified hyperlipidemia type - Plan: Lipid panel, Comprehensive metabolic panel  - Plan for Repatha, advised to coordinate this with cardiology as it appears it has been authorized.   Meds ordered this encounter  Medications  . dapagliflozin propanediol (FARXIGA) 5 MG TABS tablet    Sig: Take 5 mg by mouth daily.    Dispense:  30 tablet    Refill:  3  . sitaGLIPtin (JANUVIA) 100 MG tablet    Sig: Take 1 tablet (100 mg total) by mouth daily.    Dispense:  90 tablet    Refill:  0   Patient Instructions   For diabetes, continue [place], but add [place] once per day. Watch for increase in yeast infections, but that can be minimized with cotton underwear and can use over the counter clotrimazole if needed for mild symptoms. Recheck diabetes in 3 months either with myself or endocrinologist.  I will check a testosterone level, no change in treatment for now. Discussed it may be a good idea for endocrinologist to follow your testosterone and diabetes.  No change in blood pressure medication at this time. I will check other lab work including monitoring tests for your testosterone levels and let you know within the next 2 weeks.   IF you received an x-ray today, you will receive an invoice from [facility]. Please contact [facility] at [phone] with questions  or concerns regarding your invoice.   IF you received labwork today, you will receive an invoice from [place]. Please contact [facility] at [phone] with questions or concerns regarding your invoice.   Our billing staff will not be able to assist you with questions regarding bills from these companies.  You will be contacted with the lab results as soon as they are available. The fastest way to get your results is to activate your My Chart account. Instructions are located on the last page of this paperwork. If you have not heard from [place] regarding the results in 2 weeks, please contact this office.       I personally performed the services described in this documentation, which was scribed in my presence. The recorded information has been reviewed and considered for accuracy and completeness, addended by me as needed, and agree with information above.  Signed,   [name], MD Primary Care at [place].  08/27/16 10:06 AM

## 2016-08-27 NOTE — Addendum Note (Signed)
Addended by: Gari Crown D on: 08/27/2016 10:14 AM   Modules accepted: Orders

## 2016-08-27 NOTE — Addendum Note (Signed)
Addended by: Gari Crown D on: 08/27/2016 08:56 AM   Modules accepted: Orders

## 2016-08-27 NOTE — Patient Instructions (Addendum)
For diabetes, continue [place], but add [place] once per day. Watch for increase in yeast infections, but that can be minimized with cotton underwear and can use over the counter clotrimazole if needed for mild symptoms. Recheck diabetes in 3 months either with myself or endocrinologist.  I will check a testosterone level, no change in treatment for now. Discussed it may be a good idea for endocrinologist to follow your testosterone and diabetes.  No change in blood pressure medication at this time. I will check other lab work including monitoring tests for your testosterone levels and let you know within the next 2 weeks.   IF you received an x-ray today, you will receive an invoice from [facility]. Please contact [facility] at [phone] with questions or concerns regarding your invoice.   IF you received labwork today, you will receive an invoice from [place]. Please contact [facility] at [phone] with questions or concerns regarding your invoice.   Our billing staff will not be able to assist you with questions regarding bills from these companies.  You will be contacted with the lab results as soon as they are available. The fastest way to get your results is to activate your My Chart account. Instructions are located on the last page of this paperwork. If you have not heard from [place] regarding the results in 2 weeks, please contact this office.

## 2016-08-28 LAB — COMPREHENSIVE METABOLIC PANEL
A/G RATIO: 2 (ref 1.2–2.2)
ALK PHOS: 71 IU/L (ref 39–117)
ALT: 50 IU/L — AB (ref 0–44)
AST: 35 IU/L (ref 0–40)
Albumin: 4.5 g/dL (ref 3.6–4.8)
BUN/Creatinine Ratio: 15 (ref 10–24)
BUN: 15 mg/dL (ref 8–27)
Bilirubin Total: 0.4 mg/dL (ref 0.0–1.2)
CALCIUM: 10.1 mg/dL (ref 8.6–10.2)
CHLORIDE: 100 mmol/L (ref 96–106)
CO2: 20 mmol/L (ref 20–29)
Creatinine, Ser: 1.01 mg/dL (ref 0.76–1.27)
GFR calc Af Amer: 93 mL/min/{1.73_m2} (ref >59)
GFR, EST NON AFRICAN AMERICAN: 80 mL/min/{1.73_m2} (ref >59)
Globulin, Total: 2.3 g/dL (ref 1.5–4.5)
Glucose: 182 mg/dL — ABNORMAL HIGH (ref 65–99)
POTASSIUM: 4.4 mmol/L (ref 3.5–5.2)
Sodium: 142 mmol/L (ref 134–144)
Total Protein: 6.8 g/dL (ref 6.0–8.5)

## 2016-08-28 LAB — CBC
Hematocrit: 46.3 % (ref 37.5–51.0)
Hemoglobin: 15.4 g/dL (ref 13.0–17.7)
MCH: 29.4 pg (ref 26.6–33.0)
MCHC: 33.3 g/dL (ref 31.5–35.7)
MCV: 89 fL (ref 79–97)
PLATELETS: 168 10*3/uL (ref 150–379)
RBC: 5.23 x10E6/uL (ref 4.14–5.80)
RDW: 13.4 % (ref 12.3–15.4)
WBC: 6.9 10*3/uL (ref 3.4–10.8)

## 2016-08-28 LAB — PSA: PROSTATE SPECIFIC AG, SERUM: 1 ng/mL (ref 0.0–4.0)

## 2016-08-28 LAB — HEMOGLOBIN A1C
Est. average glucose Bld gHb Est-mCnc: 197 mg/dL
HEMOGLOBIN A1C: 8.5 % — AB (ref 4.8–5.6)

## 2016-08-28 LAB — LIPID PANEL
CHOL/HDL RATIO: 5.4 ratio — AB (ref 0.0–5.0)
Cholesterol, Total: 233 mg/dL — ABNORMAL HIGH (ref 100–199)
HDL: 43 mg/dL (ref >39)
LDL Calculated: 152 mg/dL — ABNORMAL HIGH (ref 0–99)
Triglycerides: 190 mg/dL — ABNORMAL HIGH (ref 0–149)
VLDL Cholesterol Cal: 38 mg/dL (ref 5–40)

## 2016-08-28 LAB — TESTOSTERONE, FREE, TOTAL, SHBG
Sex Hormone Binding: 28.4 nmol/L (ref 19.3–76.4)
TESTOSTERONE FREE: 12.3 pg/mL (ref 6.6–18.1)
TESTOSTERONE: 492 ng/dL (ref 264–916)

## 2016-09-19 ENCOUNTER — Telehealth: Payer: Self-pay | Admitting: Pharmacist Clinician (PhC)/ Clinical Pharmacy Specialist

## 2016-09-19 DIAGNOSIS — E785 Hyperlipidemia, unspecified: Secondary | ICD-10-CM

## 2016-09-19 NOTE — Telephone Encounter (Signed)
Started repatha July 13, will repeat labs early Sept

## 2016-10-02 ENCOUNTER — Encounter: Payer: Self-pay | Admitting: Family Medicine

## 2016-10-03 ENCOUNTER — Other Ambulatory Visit: Payer: Self-pay | Admitting: Family Medicine

## 2016-10-03 DIAGNOSIS — E1165 Type 2 diabetes mellitus with hyperglycemia: Secondary | ICD-10-CM

## 2016-10-03 MED ORDER — DULAGLUTIDE 0.75 MG/0.5ML ~~LOC~~ SOAJ: 0.7500 mg | SUBCUTANEOUS | 2 refills | Status: DC

## 2016-10-03 NOTE — Progress Notes (Signed)
See patient email. He would like to stop [place], start Trulicity. Plan on follow-up in one month.

## 2016-10-21 ENCOUNTER — Encounter: Payer: Self-pay | Admitting: Family Medicine

## 2016-10-21 ENCOUNTER — Ambulatory Visit (INDEPENDENT_AMBULATORY_CARE_PROVIDER_SITE_OTHER): Payer: Self-pay | Admitting: Family Medicine

## 2016-10-21 VITALS — BP 137/78 | HR 78 | Temp 98.0°F | Resp 16 | Ht 66.0 in | Wt 204.0 lb

## 2016-10-21 DIAGNOSIS — Z024 Encounter for examination for driving license: Secondary | ICD-10-CM

## 2016-10-21 NOTE — Patient Instructions (Addendum)
  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     IF you received an x-ray today, you will receive an invoice from [facility]. Please contact [facility] Radiology at [phone] with questions or concerns regarding your invoice.   IF you received labwork today, you will receive an invoice from [place]. Please contact [facility] at [phone] with questions or concerns regarding your invoice.   Our billing staff will not be able to assist you with questions regarding bills from these companies.  You will be contacted with the lab results as soon as they are available. The fastest way to get your results is to activate your My Chart account. Instructions are located on the last page of this paperwork. If you have not heard from [place] regarding the results in 2 weeks, please contact this office.

## 2016-10-21 NOTE — Progress Notes (Signed)
Subjective:  By signing my name below, I, Francis Cross, attest that this documentation has been prepared under the direction and in the presence of [name], MD Electronically Signed: Ladene Artist, ED Scribe 10/21/2016 at 3:17 PM.   Patient ID: Francis Cross, male    DOB: January 01, 1956, 61 y.o.   MRN: [id number]  Chief Complaint  Patient presents with  . DOT PE   HPI Alphonsa Cross is a 61 y.o. 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 H/o DM, HTN and hyperlipidemia.   H/o DM Lab Results  Component Value Date   HGBA1C 8.5 (H) 08/27/2016  He was initially started on Farxiga then a few weeks ago changed to [employer]. Normal stress test in August 2015. Reportedly lowest blood glucose was 110.    Visual Acuity Screening   Right eye Left eye Both eyes  Without correction:     With correction: 20/25 20/15 20/15   Comments: 70 on peripheral vision  Hearing Screening Comments: Whisper test 60ft pass  Pt has an upcoming eye appointment next week. Denies visual disturbances. No h/o glaucoma.   Pshx Neck surgery in 2012 and carpal tunnel surgery in 2008. Denies residual weakness or use of upper extremities.  Patient Active Problem List   Diagnosis Date Noted  . Hx of carpal tunnel repair 12/22/2015  . Testosterone deficiency 05/23/2014  . Pain in the chest 10/01/2013  . GOUT, UNSPECIFIED 01/29/2010  . Obesity 12/06/2008  . IRON OVERLOAD 08/11/2007  . HIATAL HERNIA WITH REFLUX 07/08/2006  . Hypercholesteremia 06/12/2006  . HYPERTENSION 06/12/2006  . 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 Acid reflux   . Cancer ([place])    skin cancere  . Diabetes mellitus without complication ([place])   . Erectile dysfunction   . Gout   . Hemochromatosis   . Hiatal hernia   . Hyperlipidemia   . Hypertension   . Iron overload   . Obesity    Past Surgical History:  Procedure Laterality Date  . APPENDECTOMY    . CARDIAC  CATHETERIZATION     [place]    . TONSILLECTOMY     Allergies  Allergen Reactions  . Cephalexin     REACTION: Rash  . Penicillins     REACTION: Rash   Prior to Admission medications   Medication Sig Start Date End Date Taking? Authorizing Provider  aspirin 81 MG tablet Take 81 mg by mouth daily.   Yes [provider]  Dulaglutide (TRULICITY) 4.19 QQ/2.2LN SOPN Inject 0.75 mg into the skin once a week. 10/03/16  Yes [name], MD  Evolocumab with Infusor ([place]) 420 MG/3.5ML SOCT Inject 420 mg into the skin every 30 (thirty) days. 07/26/16  Yes Wellington Hampshire, MD  ketoconazole (NIZORAL) 2 % shampoo APPLY TOPICALLY 2 (TWO) TIMES A WEEK. 04/11/16  Yes Shawnee Knapp, MD  ketorolac (ACULAR) 0.5 % ophthalmic solution Place 1 drop into both eyes 2 (two) times daily as needed. 04/11/16  Yes Shawnee Knapp, MD  lisinopril-hydrochlorothiazide (PRINZIDE,ZESTORETIC) 20-12.5 MG tablet Take 1 tablet by mouth daily. 04/11/16  Yes Shawnee Knapp, MD  meloxicam (MOBIC) 15 MG tablet Take 1 tablet (15 mg total) by mouth daily as needed for pain. Will need office visit for any additional refills 07/09/16  Yes Shawnee Knapp, MD  nystatin cream (MYCOSTATIN) Apply 1 application topically 3 (three) times daily. 04/11/16  Yes  Shawnee Knapp, MD  omeprazole (PRILOSEC) 20 MG capsule Take 1 capsule (20 mg total) by mouth daily. 04/24/16  Yes Danis, Kirke Corin, MD  sitaGLIPtin (JANUVIA) 100 MG tablet Take 1 tablet (100 mg total) by mouth daily. 08/27/16  Yes [name], MD  UNABLE TO FIND TESTOSTERONE LIPODERM 5% (50 MG/ML) CREAM. Apply 2 ml (8 clicks) topically once a day as directed. 05/13/16  Yes Shawnee Knapp, MD  triamcinolone cream (KENALOG) 0.1 %  04/04/16   [provider]   Social History   Social History  . Marital status: Married    Spouse name: N/A  . Number of children: 3  . Years of education: N/A   Occupational History  . retired    Social History Main  Topics  . Smoking status: Former Research scientist (life sciences)  . Smokeless tobacco: Former Systems developer  . Alcohol use Yes     Comment: occ beer  . Drug use: No  . Sexual activity: Not on file   Other Topics Concern  . Not on file   Social History Narrative  . No narrative on file   Review of Systems  Eyes: Negative for visual disturbance.  Neurological: Negative for weakness.      Objective:   Physical Exam  Constitutional: He is oriented to person, place, and time. He appears well-developed and well-nourished.  HENT:  Head: Normocephalic and atraumatic.  Right Ear: External ear normal.  Left Ear: External ear normal.  Mouth/Throat: Oropharynx is clear and moist.  Eyes: Pupils are equal, round, and reactive to light. Conjunctivae and EOM are normal.  Neck: Normal range of motion. Neck supple. No thyromegaly present.  Cardiovascular: Normal rate, regular rhythm, normal heart sounds and intact distal pulses.   Pulmonary/Chest: Effort normal and breath sounds normal. No respiratory distress. He has no wheezes.  Abdominal: Soft. He exhibits no distension. There is no tenderness. Hernia confirmed negative in the right inguinal area and confirmed negative in the left inguinal area.  Musculoskeletal: Normal range of motion. He exhibits no edema or tenderness.  Lymphadenopathy:    He has no cervical adenopathy.  Neurological: He is alert and oriented to person, place, and time. He has normal reflexes.  Skin: Skin is warm and dry.  Psychiatric: He has a normal mood and affect. His behavior is normal.  Vitals reviewed.    Vitals:   10/21/16 1437  BP: 137/78  Pulse: 78  Resp: 16  Temp: 98 F (36.7 C)  TempSrc: Oral  SpO2: 95%  Weight: 204 lb (92.5 kg)  Height: 5\' 6"  (1.676 m)      Assessment & Plan:  Francis Cross is a 61 y.o. 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 C spine ROM appears sufficient to drive. No concerns on exam. See  DOT paperwork.   No orders of the defined types were placed in this encounter.  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     IF you received an x-ray today, you will receive an invoice from [facility]. Please contact [facility]iology at [phone] with questions or concerns regarding your invoice.   IF you received labwork today, you will receive an invoice from [place]. Please contact [facility] at [phone] with questions or concerns regarding your invoice.   Our billing staff will not be able to assist you with questions regarding bills from these companies.  You will be contacted with the lab  results as soon as they are available. The fastest way to get your results is to activate your My Chart account. Instructions are located on the last page of this paperwork. If you have not heard from [place] regarding the results in 2 weeks, please contact this office.       I personally performed the services described in this documentation, which was scribed in my presence. The recorded information has been reviewed and considered for accuracy and completeness, addended by me as needed, and agree with information above.  Signed,   [name], MD Primary Care at [place].  10/22/16 10:49 PM

## 2016-10-24 ENCOUNTER — Encounter: Payer: BLUE CROSS/BLUE SHIELD | Admitting: Family Medicine

## 2016-10-28 ENCOUNTER — Other Ambulatory Visit: Payer: Self-pay | Admitting: Family Medicine

## 2016-11-21 ENCOUNTER — Encounter: Payer: Self-pay | Admitting: Gastroenterology

## 2016-11-21 ENCOUNTER — Encounter: Payer: Self-pay | Admitting: Cardiovascular Disease

## 2016-11-21 ENCOUNTER — Encounter: Payer: Self-pay | Admitting: Family Medicine

## 2016-11-21 DIAGNOSIS — E1165 Type 2 diabetes mellitus with hyperglycemia: Secondary | ICD-10-CM

## 2016-11-24 MED ORDER — DULAGLUTIDE 0.75 MG/0.5ML ~~LOC~~ SOAJ: 0.7500 mg | SUBCUTANEOUS | 2 refills | Status: DC

## 2016-11-25 ENCOUNTER — Other Ambulatory Visit: Payer: Self-pay

## 2016-11-25 DIAGNOSIS — R1011 Right upper quadrant pain: Secondary | ICD-10-CM

## 2016-11-28 ENCOUNTER — Ambulatory Visit (INDEPENDENT_AMBULATORY_CARE_PROVIDER_SITE_OTHER): Payer: BLUE CROSS/BLUE SHIELD | Admitting: Family Medicine

## 2016-11-28 ENCOUNTER — Other Ambulatory Visit: Payer: Self-pay | Admitting: Cardiovascular Disease

## 2016-11-28 ENCOUNTER — Encounter: Payer: Self-pay | Admitting: Family Medicine

## 2016-11-28 VITALS — BP 116/68 | HR 88 | Temp 98.6°F | Resp 16 | Ht 66.0 in | Wt 209.6 lb

## 2016-11-28 DIAGNOSIS — M545 Low back pain, unspecified: Secondary | ICD-10-CM

## 2016-11-28 DIAGNOSIS — M79671 Pain in right foot: Secondary | ICD-10-CM

## 2016-11-28 DIAGNOSIS — R1011 Right upper quadrant pain: Secondary | ICD-10-CM | POA: Diagnosis not present

## 2016-11-28 DIAGNOSIS — E1140 Type 2 diabetes mellitus with diabetic neuropathy, unspecified: Secondary | ICD-10-CM

## 2016-11-28 DIAGNOSIS — M79672 Pain in left foot: Secondary | ICD-10-CM | POA: Diagnosis not present

## 2016-11-28 DIAGNOSIS — E1165 Type 2 diabetes mellitus with hyperglycemia: Secondary | ICD-10-CM

## 2016-11-28 LAB — POCT URINALYSIS DIP (MANUAL ENTRY)
Bilirubin, UA: NEGATIVE
Blood, UA: NEGATIVE
Glucose, UA: NEGATIVE mg/dL
Ketones, POC UA: NEGATIVE mg/dL
LEUKOCYTES UA: NEGATIVE
NITRITE UA: NEGATIVE
PROTEIN UA: NEGATIVE mg/dL
Spec Grav, UA: 1.025 (ref 1.010–1.025)
UROBILINOGEN UA: 0.2 U/dL
pH, UA: 6 (ref 5.0–8.0)

## 2016-11-28 LAB — HEMOGLOBIN A1C
Est. average glucose Bld gHb Est-mCnc: 163 mg/dL
Hgb A1c MFr Bld: 7.3 % — ABNORMAL HIGH (ref 4.8–5.6)

## 2016-11-28 MED ORDER — LISINOPRIL-HYDROCHLOROTHIAZIDE 20-12.5 MG PO TABS: 1.0000 | ORAL_TABLET | Freq: Every day | ORAL | 0 refills | Status: DC

## 2016-11-28 MED ORDER — PREGABALIN 50 MG PO CAPS: 50.0000 mg | ORAL_CAPSULE | Freq: Three times a day (TID) | ORAL | 2 refills | Status: DC

## 2016-11-28 MED ORDER — SITAGLIPTIN PHOSPHATE 100 MG PO TABS: 100.0000 mg | ORAL_TABLET | Freq: Every day | ORAL | 0 refills | Status: DC

## 2016-11-28 MED ORDER — LISINOPRIL-HYDROCHLOROTHIAZIDE 20-12.5 MG PO TABS: 1.0000 | ORAL_TABLET | Freq: Every day | ORAL | 1 refills | Status: DC

## 2016-11-28 NOTE — Progress Notes (Signed)
Subjective:  This chart was scribed for Francis Agreste, MD by Tamsen Roers, at [place] at [facility].  This patient was seen in room 10 and the patient's care was started at 10:20 AM.   Chief Complaint  Patient presents with  . Follow-up    TIIDM      Patient ID: Francis Cross, male    DOB: 04/24/1955, 61 y.o.   MRN: [id number]  HPI HPI Comments: Francis Cross is a 61 y.o. male who presents to Primary Care at [facility] for a follow up.     Diabetes: See last visit regarding intolerances of various medications.   We tried Farxiga 5 mg QD and Januvia 100 mg QD, changed to Trulicity .75 mg once per week. He did have some neuropathic symptoms in the past but stopped Gabapentin as it made him depressed.  Patient has been compliant with Trulicity and has been having blood sugar readings of 125-130 (sometimes lower)  In the mornings and 150's to 160's in the afternoon.  States that these are the best readings he has had "in a while". No symptomatic lows.   Patient restarted the Gabapentin recently for his nerve pain on the bottom of his feet (with burning/tingling sensation) but states that it gave him feelings of depression as he had initially felt. He threw out the rest of the medication after this occurred.  He does feel that the Gabapentin helped him with the pain.   Lab Results  Component Value Date   HGBA1C 8.5 (H) 08/27/2016   Lab Results  Component Value Date   MICROALBUR 0.5 09/21/2015  Dentist: Patient sees his dentist every 6 months.  Optho: [facility]. Reported his last optho visit was approximately 1-2 months ago.   Back pain/ abdominal pain: Patient had some back pain about two days ago but states that it has resolved. He started having episodic right upper quadrant  abdominal pain yesterday but states that this has really been going on for many years and has seen a specialist in the past for it. Patient had a complete abdominal ultrasound in 2010 with increased  echo of the liver diffusely, normal gall bladder, MRI of abdomen in 2011 indicating hepatic steatosis. Denies blood in urine or difficulty urinating.   Lab Results  Component Value Date   ALT 50 (H) 08/27/2016   AST 35 08/27/2016   ALKPHOS 71 08/27/2016   BILITOT 0.4 08/27/2016    Hyperlipidemia: He was started Repatha by cardiology.  Lab Results  Component Value Date   CHOL 233 (H) 08/27/2016   HDL 43 08/27/2016   LDLCALC 152 (H) 08/27/2016   LDLDIRECT 152.1 10/12/2008   TRIG 190 (H) 08/27/2016   CHOLHDL 5.4 (H) 08/27/2016   Lab Results  Component Value Date   ALT 50 (H) 08/27/2016   AST 35 08/27/2016   ALKPHOS 71 08/27/2016   BILITOT 0.4 08/27/2016     Patient Active Problem List   Diagnosis Date Noted  . Hx of carpal tunnel repair 12/22/2015  . Testosterone deficiency 05/23/2014  . Pain in the chest 10/01/2013  . GOUT, UNSPECIFIED 01/29/2010  . Obesity 12/06/2008  . IRON OVERLOAD 08/11/2007  . HIATAL HERNIA WITH REFLUX 07/08/2006  . Hypercholesteremia 06/12/2006  . HYPERTENSION 06/12/2006  . 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 Acid reflux   . Cancer ([place])    skin cancere  . Diabetes mellitus without complication ([place])   . Erectile dysfunction   .  Gout   . Hemochromatosis   . Hiatal hernia   . Hyperlipidemia   . Hypertension   . Iron overload   . Obesity    Past Surgical History:  Procedure Laterality Date  . APPENDECTOMY    . CARDIAC CATHETERIZATION     [place]    . TONSILLECTOMY     Allergies  Allergen Reactions  . Cephalexin     REACTION: Rash  . Penicillins     REACTION: Rash   Prior to Admission medications   Medication Sig Start Date End Date Taking? Authorizing Provider  aspirin 81 MG tablet Take 81 mg by mouth daily.    [provider]  Dulaglutide (TRULICITY) 6.23 JS/2.8BT SOPN Inject 0.75 mg into the skin once a week. 11/24/16   Francis Agreste, MD  Evolocumab  with Infusor ([place]) 420 MG/3.5ML SOCT Inject 420 mg into the skin every 30 (thirty) days. 07/26/16   Wellington Hampshire, MD  ketoconazole (NIZORAL) 2 % shampoo APPLY TOPICALLY 2 (TWO) TIMES A WEEK. 04/11/16   Shawnee Knapp, MD  ketorolac (ACULAR) 0.5 % ophthalmic solution Place 1 drop into both eyes 2 (two) times daily as needed. 04/11/16   Shawnee Knapp, MD  lisinopril-hydrochlorothiazide (PRINZIDE,ZESTORETIC) 20-12.5 MG tablet TAKE 1 TABLET BY MOUTH DAILY. 10/28/16   Shawnee Knapp, MD  meloxicam (MOBIC) 15 MG tablet Take 1 tablet (15 mg total) by mouth daily as needed for pain. Will need office visit for any additional refills 07/09/16   Shawnee Knapp, MD  nystatin cream (MYCOSTATIN) Apply 1 application topically 3 (three) times daily. 04/11/16   Shawnee Knapp, MD  omeprazole (PRILOSEC) 20 MG capsule Take 1 capsule (20 mg total) by mouth daily. 04/24/16   Doran Stabler, MD  sitaGLIPtin (JANUVIA) 100 MG tablet Take 1 tablet (100 mg total) by mouth daily. 08/27/16   Francis Agreste, MD  triamcinolone cream (KENALOG) 0.1 %  04/04/16   [provider]  UNABLE TO FIND TESTOSTERONE LIPODERM 5% (50 MG/ML) CREAM. Apply 2 ml (8 clicks) topically once a day as directed. 05/13/16   Shawnee Knapp, MD   Social History   Social History  . Marital status: Married    Spouse name: N/A  . Number of children: 3  . Years of education: N/A   Occupational History  . retired    Social History Main Topics  . Smoking status: Former Research scientist (life sciences)  . Smokeless tobacco: Former Systems developer  . Alcohol use Yes     Comment: occ beer  . Drug use: No  . Sexual activity: Not on file   Other Topics Concern  . Not on file   Social History Narrative  . No narrative on file      Review of Systems  Constitutional: Negative for fatigue and unexpected weight change.  Eyes: Negative for visual disturbance.  Respiratory: Negative for cough, chest tightness and shortness of breath.   Cardiovascular: Negative for chest  pain, palpitations and leg swelling.  Gastrointestinal: Negative for abdominal pain and blood in stool.  Neurological: Negative for dizziness, light-headedness and headaches.       Objective:   Physical Exam  Constitutional: He is oriented to person, place, and time. He appears well-developed and well-nourished.  HENT:  Head: Normocephalic and atraumatic.  Eyes: Pupils are equal, round, and reactive to light. EOM are normal.  Neck: No JVD present. Carotid bruit is not present.  Cardiovascular: Normal rate,  regular rhythm and normal heart sounds.   No murmur heard. Pulmonary/Chest: Effort normal and breath sounds normal. He has no rales.  Abdominal: Soft. He exhibits no distension. There is no tenderness.  Musculoskeletal: He exhibits no edema.  Neurological: He is alert and oriented to person, place, and time.  Skin: Skin is warm and dry.  Psychiatric: He has a normal mood and affect.  Vitals reviewed.   Vitals:   11/28/16 0953  BP: 116/68  Pulse: 88  Resp: 16  Temp: 98.6 F (37 C)  SpO2: 96%  Weight: 209 lb 9.6 oz (95.1 kg)  Height: 5\' 6"  (1.676 m)    Results for orders placed or performed in visit on 11/28/16  POCT urinalysis dipstick  Result Value Ref Range   Color, UA yellow yellow   Clarity, UA clear clear   Glucose, UA negative negative mg/dL   Bilirubin, UA negative negative   Ketones, POC UA negative negative mg/dL   Spec Grav, UA 1.025 1.010 - 1.025   Blood, UA negative negative   pH, UA 6.0 5.0 - 8.0   Protein Ur, POC negative negative mg/dL   Urobilinogen, UA 0.2 0.2 or 1.0 E.U./dL   Nitrite, UA Negative Negative   Leukocytes, UA Negative Negative        Assessment & Plan:   Francis Cross is a 61 y.o. male Type 2 diabetes mellitus with diabetic neuropathy, without long-term current use of insulin (HCC) - Plan: Microalbumin, urine, Hemoglobin A1c, pregabalin (LYRICA) 50 MG capsule Type 2 diabetes mellitus with hyperglycemia, without long-term  current use of insulin (HCC) - Plan: sitaGLIPtin (JANUVIA) 100 MG tablet  - tolerating Trulicity and Januvia. Check A1c, urine microalbumin.   Pain in both feet - Plan: pregabalin (LYRICA) 50 MG capsule  -likely d/t diabetic neuropathy.   - did not tolerate gabapentin due to depressive symptoms, but did have some relief of pain.    -trial of lyrica 50mg  tid  Acute left-sided low back pain without sciatica - Plan: POCT urinalysis dipstick  - no hematuria, less likely nephrolith.  Possible mechanical LBP.   - improving. rtc precautions if pain persists or not continuing to improve.   RUQ abdominal pain  -intermittent, with prior hepatic steatosis.   - recent LFT's by cardiology - requested those to be sent here for review.   -if pain persists or worsens,  consider repeat ultrasound to eval liver/glabbladder, rtc precautions.     Meds ordered this encounter  Medications  . pregabalin (LYRICA) 50 MG capsule    Sig: Take 1 capsule (50 mg total) by mouth 3 (three) times daily.    Dispense:  90 capsule    Refill:  2  . DISCONTD: lisinopril-hydrochlorothiazide (PRINZIDE,ZESTORETIC) 20-12.5 MG tablet    Sig: Take 1 tablet by mouth daily.    Dispense:  30 tablet    Refill:  0  . sitaGLIPtin (JANUVIA) 100 MG tablet    Sig: Take 1 tablet (100 mg total) by mouth daily.    Dispense:  90 tablet    Refill:  0  . lisinopril-hydrochlorothiazide (PRINZIDE,ZESTORETIC) 20-12.5 MG tablet    Sig: Take 1 tablet by mouth daily.    Dispense:  90 tablet    Refill:  1   Patient Instructions   For diabetes, continue Januvia and trulicity at same doses for now. We can look at other changes if needed based on your hemoglobin A1c.  If any persistent upper abdominal pain or worsening, would recommend repeat ultrasound to  look at her gallbladder. Please have your cardiologist and me a copy of your most recent liver testing to compare to previous levels.  For foot pain, can try Lyrica 1 pill at night  initially, then increase up to 3 times per day over the next week or so. If any depression symptoms without medication stop it and follow-up to discuss other treatment options.  I will check a urine test for blood today, but if you have any worsening of back pain or abdominal pain, please return for possible other testing and x-rays.  Return to the clinic or go to the nearest emergency room if any of your symptoms worsen or new symptoms occur.    IF you received an x-ray today, you will receive an invoice from [facility]. Please contact [facility] at [phone] with questions or concerns regarding your invoice.   IF you received labwork today, you will receive an invoice from [place]. Please contact [facility] at [phone] with questions or concerns regarding your invoice.   Our billing staff will not be able to assist you with questions regarding bills from these companies.  You will be contacted with the lab results as soon as they are available. The fastest way to get your results is to activate your My Chart account. Instructions are located on the last page of this paperwork. If you have not heard from [place] regarding the results in 2 weeks, please contact this office.       I personally performed the services described in this documentation, which was scribed in my presence. The recorded information has been reviewed and considered for accuracy and completeness, addended by me as needed, and agree with information above.  Signed,   [name], MD Primary Care at [place].  11/28/16 8:58 PM

## 2016-11-28 NOTE — Patient Instructions (Addendum)
For diabetes, continue Januvia and trulicity at same doses for now. We can look at other changes if needed based on your hemoglobin A1c.  If any persistent upper abdominal pain or worsening, would recommend repeat ultrasound to look at her gallbladder. Please have your cardiologist and me a copy of your most recent liver testing to compare to previous levels.  For foot pain, can try Lyrica 1 pill at night initially, then increase up to 3 times per day over the next week or so. If any depression symptoms without medication stop it and follow-up to discuss other treatment options.  I will check a urine test for blood today, but if you have any worsening of back pain or abdominal pain, please return for possible other testing and x-rays.  Return to the clinic or go to the nearest emergency room if any of your symptoms worsen or new symptoms occur.    IF you received an x-ray today, you will receive an invoice from [facility]. Please contact [facility] at [phone] with questions or concerns regarding your invoice.   IF you received labwork today, you will receive an invoice from [place]. Please contact [facility] at [phone] with questions or concerns regarding your invoice.   Our billing staff will not be able to assist you with questions regarding bills from these companies.  You will be contacted with the lab results as soon as they are available. The fastest way to get your results is to activate your My Chart account. Instructions are located on the last page of this paperwork. If you have not heard from [place] regarding the results in 2 weeks, please contact this office.

## 2016-11-29 LAB — MICROALBUMIN, URINE: Microalbumin, Urine: 3.1 ug/mL

## 2016-12-02 LAB — LIPID PANEL
Cholesterol: 121 mg/dL (ref <200)
HDL: 46 mg/dL (ref >40)
LDL Cholesterol (Calc): 52 mg/dL (calc)
Non-HDL Cholesterol (Calc): 75 mg/dL (calc) (ref <130)
TRIGLYCERIDES: 150 mg/dL — AB (ref <150)
Total CHOL/HDL Ratio: 2.6 (calc) (ref <5.0)

## 2016-12-02 LAB — HEPATIC FUNCTION PANEL
AG RATIO: 2 (calc) (ref 1.0–2.5)
ALBUMIN MSPROF: 4.3 g/dL (ref 3.6–5.1)
ALT: 36 U/L (ref 9–46)
AST: 20 U/L (ref 10–35)
Alkaline phosphatase (APISO): 56 U/L (ref 40–115)
Bilirubin, Direct: 0.1 mg/dL (ref 0.0–0.2)
GLOBULIN: 2.2 g/dL (ref 1.9–3.7)
Indirect Bilirubin: 0.5 mg/dL (calc) (ref 0.2–1.2)
TOTAL PROTEIN: 6.5 g/dL (ref 6.1–8.1)
Total Bilirubin: 0.6 mg/dL (ref 0.2–1.2)

## 2016-12-04 ENCOUNTER — Encounter: Payer: Self-pay | Admitting: Cardiovascular Disease

## 2016-12-05 ENCOUNTER — Other Ambulatory Visit: Payer: Self-pay | Admitting: Family Medicine

## 2016-12-18 ENCOUNTER — Other Ambulatory Visit: Payer: Self-pay | Admitting: Family Medicine

## 2016-12-18 DIAGNOSIS — E1165 Type 2 diabetes mellitus with hyperglycemia: Secondary | ICD-10-CM

## 2016-12-19 ENCOUNTER — Other Ambulatory Visit: Payer: Self-pay | Admitting: Family Medicine

## 2016-12-26 ENCOUNTER — Encounter: Payer: Self-pay | Admitting: Family Medicine

## 2016-12-31 ENCOUNTER — Other Ambulatory Visit: Payer: Self-pay | Admitting: Family Medicine

## 2017-01-01 NOTE — Telephone Encounter (Signed)
Dr. Carlota Raspberry, can we refill this? Dr. Brigitte Pulse was the last one to fill, but you saw last for diabetic.Francis Cross

## 2017-01-05 NOTE — Telephone Encounter (Signed)
Refill for #30, but follow-up to discuss plan prior to that expiring. We have discussed in the past potential cardiac risks with this class of medications, especially with his history of diabetes.

## 2017-01-29 ENCOUNTER — Encounter: Payer: Self-pay | Admitting: Cardiovascular Disease

## 2017-02-04 ENCOUNTER — Other Ambulatory Visit: Payer: Self-pay | Admitting: Family Medicine

## 2017-02-17 ENCOUNTER — Other Ambulatory Visit: Payer: Self-pay | Admitting: Family Medicine

## 2017-02-21 NOTE — Telephone Encounter (Signed)
Pt needs OV for any additional refills - please sched w/ [name]

## 2017-02-22 NOTE — Telephone Encounter (Signed)
MyChart message sent to pt about making an apt for med refills

## 2017-02-24 ENCOUNTER — Other Ambulatory Visit: Payer: Self-pay | Admitting: Family Medicine

## 2017-02-26 NOTE — Telephone Encounter (Signed)
Contacted pt regarding the need for office visit prior to filling refill; pt offered and accepted appointment on 03/10/17 at 1040 with Dr Nyoka Cowden; pt verbalizes understand; pt confirmed pharmacy of choice and 30 day refill sent to [facility], [place].

## 2017-03-07 HISTORY — PX: KIDNEY STONE SURGERY: SHX686

## 2017-03-10 ENCOUNTER — Encounter: Payer: Self-pay | Admitting: Family Medicine

## 2017-03-31 ENCOUNTER — Ambulatory Visit: Payer: BLUE CROSS/BLUE SHIELD | Admitting: Family Medicine

## 2017-03-31 ENCOUNTER — Other Ambulatory Visit: Payer: Self-pay

## 2017-03-31 ENCOUNTER — Encounter: Payer: Self-pay | Admitting: Cardiovascular Disease

## 2017-03-31 ENCOUNTER — Encounter: Payer: Self-pay | Admitting: Family Medicine

## 2017-03-31 VITALS — BP 124/76 | HR 87 | Temp 97.7°F | Resp 18 | Ht 66.0 in | Wt 208.6 lb

## 2017-03-31 DIAGNOSIS — E1165 Type 2 diabetes mellitus with hyperglycemia: Secondary | ICD-10-CM | POA: Diagnosis not present

## 2017-03-31 DIAGNOSIS — E1140 Type 2 diabetes mellitus with diabetic neuropathy, unspecified: Secondary | ICD-10-CM

## 2017-03-31 DIAGNOSIS — I10 Essential (primary) hypertension: Secondary | ICD-10-CM | POA: Diagnosis not present

## 2017-03-31 DIAGNOSIS — M79672 Pain in left foot: Secondary | ICD-10-CM

## 2017-03-31 DIAGNOSIS — M79671 Pain in right foot: Secondary | ICD-10-CM | POA: Diagnosis not present

## 2017-03-31 DIAGNOSIS — Z79890 Hormone replacement therapy (postmenopausal): Secondary | ICD-10-CM

## 2017-03-31 DIAGNOSIS — E785 Hyperlipidemia, unspecified: Secondary | ICD-10-CM | POA: Diagnosis not present

## 2017-03-31 DIAGNOSIS — E291 Testicular hypofunction: Secondary | ICD-10-CM | POA: Diagnosis not present

## 2017-03-31 DIAGNOSIS — Z5181 Encounter for therapeutic drug level monitoring: Secondary | ICD-10-CM

## 2017-03-31 MED ORDER — LISINOPRIL-HYDROCHLOROTHIAZIDE 20-12.5 MG PO TABS: 1.0000 | ORAL_TABLET | Freq: Every day | ORAL | 1 refills | Status: DC

## 2017-03-31 MED ORDER — PREGABALIN 50 MG PO CAPS: 50.0000 mg | ORAL_CAPSULE | Freq: Three times a day (TID) | ORAL | 2 refills | Status: DC | PRN

## 2017-03-31 MED ORDER — UNABLE TO FIND: 4 refills | Status: DC

## 2017-03-31 MED ORDER — MELOXICAM 7.5 MG PO TABS
7.5000 mg | ORAL_TABLET | Freq: Every day | ORAL | 1 refills | Status: DC
Start: 2017-03-31 — End: 2018-01-12

## 2017-03-31 NOTE — Patient Instructions (Addendum)
  Stop Januvia, continue Trulicity. Depending on A1c, may need higher dose of Trulicity.   I will check testosterone levels, prostate and blood counts.We will need to repeat testosterone testing a few weeks after restarting meds. That will be in the computer for lab only visit.   I would recommend starting lyrica if your feet burning returns.   Try tylenol for pain first - minimize use of meloxicam if possible. Follow up to discuss that medicine and pains in next 6 weeks if still needing frequent meloxicam.   Return to the clinic or go to the nearest emergency room if any of your symptoms worsen or new symptoms occur.    IF you received an x-ray today, you will receive an invoice from [facility]. Please contact [facility] at [phone] with questions or concerns regarding your invoice.   IF you received labwork today, you will receive an invoice from [place]. Please contact [facility] at [phone] with questions or concerns regarding your invoice.   Our billing staff will not be able to assist you with questions regarding bills from these companies.  You will be contacted with the lab results as soon as they are available. The fastest way to get your results is to activate your My Chart account. Instructions are located on the last page of this paperwork. If you have not heard from [place] regarding the results in 2 weeks, please contact this office.

## 2017-03-31 NOTE — Progress Notes (Signed)
Subjective:  By signing my name below, I, Essence Howell, attest that this documentation has been prepared under the direction and in the presence of [name], MD Electronically Signed: Ladene Artist, ED Scribe 03/31/2017 at 10:48 AM.   Patient ID: Francis Cross, male    DOB: November 12, 1955, 62 y.o.   MRN: [id number]  Chief Complaint  Patient presents with  . Medication Refill    lisinopril, would like 90 day on mobic, and testosterone    HPI Francis Cross is a 62 y.o. male who presents to Primary Care at [facility] for f/u.  HTN Lisinopril-HCTZ 20-12.5 mg qd. Pt occasionally checks his BP with readings of 130/80s. Denies new cp, sob, difficulty breathing. Lab Results  Component Value Date   CREATININE 1.01 08/27/2016   DM Lab Results  Component Value Date   HGBA1C 7.3 (H) 11/28/2016  Had improved from 8.5 previously. Was continued on Trulicity 9.37 mg once/wk. Also taking Januvia. Had restarted Gabapentin for peripheral neuropathy. Felt depression symptoms with Gabapentin so tried Lyrica 50 mg tid. -- Reports some nausea, vomiting when he first started Trulicity but none recently. Pt reports GI upset with Metformin. He has not seen endocrinology yet. He has not been exercising due to kidney stones. Saw optho ~1 wk ago; no h/o retinopathy or glaucoma. Pt states that he lost the prescription for Lyrica so he never got the medication filled. However, he reports that burning sensation in his feet has improved some.  Wt Readings from Last 3 Encounters:  03/31/17 208 lb 9.6 oz (94.6 kg)  11/28/16 209 lb 9.6 oz (95.1 kg)  10/21/16 204 lb (92.5 kg)   Hypogonadism See prior visits. Was taking lipoderm 5% cream 50 mg/mL, 2 mL topically qd. Testosterone last level 08/2016 was 492. Last PSA was normal 08/2016 as well as CBC. Did have a lipid panel in Sept. Improving with Repatha. -- Pt ran out of testosterone around thebeginning of the month.  Hyperlipidemia Lab Results  Component Value Date     CHOL 121 11/28/2016   HDL 46 11/28/2016   LDLCALC 152 (H) 08/27/2016   LDLDIRECT 152.1 10/12/2008   TRIG 150 (H) 11/28/2016   CHOLHDL 2.6 11/28/2016   Lab Results  Component Value Date   ALT 36 11/28/2016   AST 20 11/28/2016   ALKPHOS 71 08/27/2016   BILITOT 0.6 11/28/2016  Intolerant to multiple statins. Cardiology: Dr. Fletcher Anon. Lipid clinic eval 5/23, plan to start [place].  Kidney Stones Pt was seen in the ED on 1/2 for R flank pain and diagnosed with kidney stones. Reports he is still experiencing R flank pain that he describes as "twinges" which he plans to return to f/u on further. Pt is followed at [facility].  Joint Pain Pt requests a refill of Mobic for joint pain which he has been taking daily for a couple of yrs. Followed by Dr. Amedeo Plenty for carpal tunnel and shoulder pain.  Patient Active Problem List   Diagnosis Date Noted  . Hx of carpal tunnel repair 12/22/2015  . Testosterone deficiency 05/23/2014  . Pain in the chest 10/01/2013  . GOUT, UNSPECIFIED 01/29/2010  . Obesity 12/06/2008  . IRON OVERLOAD 08/11/2007  . HIATAL HERNIA WITH REFLUX 07/08/2006  . Hypercholesteremia 06/12/2006  . HYPERTENSION 06/12/2006  . 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 Acid reflux   . Cancer ([place])    skin cancere  . Diabetes mellitus without complication ([place])   . Erectile  dysfunction   . Gout   . Hemochromatosis   . Hiatal hernia   . Hyperlipidemia   . Hypertension   . Iron overload   . Obesity    Past Surgical History:  Procedure Laterality Date  . APPENDECTOMY    . CARDIAC CATHETERIZATION     [place]    . TONSILLECTOMY     Allergies  Allergen Reactions  . Cephalexin     REACTION: Rash  . Penicillins     REACTION: Rash   Prior to Admission medications   Medication Sig Start Date End Date Taking? Authorizing Provider  aspirin 81 MG tablet Take 81 mg by mouth daily.    [provider]   Dulaglutide (TRULICITY) 1.32 GM/0.1UU SOPN Inject 0.75 mg into the skin once a week. 11/24/16   [name], MD  Evolocumab with Infusor ([place]) 420 MG/3.5ML SOCT Inject 420 mg into the skin every 30 (thirty) days. 07/26/16   Wellington Hampshire, MD  ketoconazole (NIZORAL) 2 % shampoo APPLY TOPICALLY 2 (TWO) TIMES A WEEK. 04/11/16   Shawnee Knapp, MD  ketorolac (ACULAR) 0.5 % ophthalmic solution Place 1 drop into both eyes 2 (two) times daily as needed. 04/11/16   Shawnee Knapp, MD  lisinopril-hydrochlorothiazide (PRINZIDE,ZESTORETIC) 20-12.5 MG tablet Take 1 tablet by mouth daily. 11/28/16   [name], MD  meloxicam (MOBIC) 15 MG tablet TAKE 1 TABLET BY MOUTH DAILY AS NEEDED FOR PAIN 02/26/17   [name], MD  nystatin cream (MYCOSTATIN) Apply 1 application topically 3 (three) times daily. 04/11/16   Shawnee Knapp, MD  omeprazole (PRILOSEC) 20 MG capsule Take 1 capsule (20 mg total) by mouth daily. 04/24/16   Doran Stabler, MD  pregabalin (LYRICA) 50 MG capsule Take 1 capsule (50 mg total) by mouth 3 (three) times daily. 11/28/16   [name], MD  sitaGLIPtin (JANUVIA) 100 MG tablet Take 1 tablet (100 mg total) by mouth daily. 11/28/16   [name], MD  triamcinolone cream (KENALOG) 0.1 %  04/04/16   [provider]  UNABLE TO FIND TESTOSTERONE LIPODERM 5% (50 MG/ML) CREAM. Apply 2 ml (8 clicks) topically once a day as directed. 05/13/16   Shawnee Knapp, MD   Social History   Socioeconomic History  . Marital status: Married    Spouse name: Not on file  . Number of children: 3  . Years of education: Not on file  . Highest education level: Not on file  Social Needs  . Financial resource strain: Not on file  . Food insecurity - worry: Not on file  . Food insecurity - inability: Not on file  . Transportation needs - medical: Not on file  . Transportation needs - non-medical: Not on file  Occupational History  . Occupation: retired  Tobacco Use   . Smoking status: Former Research scientist (life sciences)  . Smokeless tobacco: Former Network engineer and Sexual Activity  . Alcohol use: Yes    Comment: occ beer  . Drug use: No  . Sexual activity: Not on file  Other Topics Concern  . Not on file  Social History Narrative  . Not on file   Review of Systems  Constitutional: Negative for fatigue and unexpected weight change.  Eyes: Negative for visual disturbance.  Respiratory: Negative for cough, chest tightness and shortness of breath.   Cardiovascular: Negative for palpitations and leg swelling. Chest pain: chronic; unchanged.  Gastrointestinal: Negative for abdominal  pain, blood in stool, nausea and vomiting.  Musculoskeletal: Positive for arthralgias.  Neurological: Negative for dizziness, light-headedness and headaches.      Objective:   Physical Exam  Constitutional: He is oriented to person, place, and time. He appears well-developed and well-nourished.  HENT:  Head: Normocephalic and atraumatic.  Eyes: EOM are normal. Pupils are equal, round, and reactive to light.  Neck: No JVD present. Carotid bruit is not present.  Cardiovascular: Normal rate, regular rhythm and normal heart sounds.  No murmur heard. Pulmonary/Chest: Effort normal and breath sounds normal. He has no rales.  Musculoskeletal: He exhibits no edema.  Neurological: He is alert and oriented to person, place, and time.  Skin: Skin is warm and dry.  Psychiatric: He has a normal mood and affect.  Vitals reviewed.   Vitals:   03/31/17 1031  BP: 124/76  Pulse: 87  Resp: 18  Temp: 97.7 F (36.5 C)  TempSrc: Oral  SpO2: 95%  Weight: 208 lb 9.6 oz (94.6 kg)  Height: 5\' 6"  (1.676 m)      Assessment & Plan:   Francis Cross is a 62 y.o. male Type 2 diabetes mellitus with diabetic neuropathy, without long-term current use of insulin ([place]) - Plan: Hemoglobin A1c, pregabalin (LYRICA) 50 MG capsule, Testosterone,Free and Total Type 2 diabetes mellitus with hyperglycemia, without  long-term current use of insulin ([facility]) - Plan: Hemoglobin A1c  - stop Januvia as similar mechanism of Trulicity.   - check A1c. If elevated, would consider higher dose of Trulicity  Pain in both feet - Plan: pregabalin (LYRICA) 50 MG capsule  - Lyrica if needed. Currently improved  Hypogonadism in male - Plan: Testosterone, Free, Total, SHBG, Testosterone,Free and Total Encounter for monitoring testosterone replacement therapy - Plan: PSA, CBC  - Off testosterone recently. Check baseline level now then return in 4-6 weeks once on medication for repeat testing. Other monitoring tests including PSA, CBC, CMP obtained. Recently had lipid panel.  Essential hypertension - Plan: lisinopril-hydrochlorothiazide (PRINZIDE,ZESTORETIC) 20-12.5 MG tablet, Comprehensive metabolic panel  - controlled, no changes.   Hyperlipidemia, unspecified hyperlipidemia type - Plan: Comprehensive metabolic panel  - tolerating Repatha  Discussed risks of meloxicam, and preference to minimize that use with his history of hypertension, hyperlipidemia, and diabetes and risk for cardiac disease. Tylenol over-the-counter, then follow-up if persistent meloxicam needed to look at other options.   Meds ordered this encounter  Medications  . lisinopril-hydrochlorothiazide (PRINZIDE,ZESTORETIC) 20-12.5 MG tablet    Sig: Take 1 tablet by mouth daily.    Dispense:  90 tablet    Refill:  1  . meloxicam (MOBIC) 7.5 MG tablet    Sig: Take 1 tablet (7.5 mg total) by mouth daily.    Dispense:  30 tablet    Refill:  1  . pregabalin (LYRICA) 50 MG capsule    Sig: Take 1 capsule (50 mg total) by mouth 3 (three) times daily as needed.    Dispense:  90 capsule    Refill:  2  . UNABLE TO FIND    Sig: TESTOSTERONE LIPODERM 5% (50 MG/ML) CREAM. Apply 2 ml (8 clicks) topically once a day as directed.    Dispense:  60 mL    Refill:  4    Please fax to [place]: [phone]   Patient Instructions    Stop Januvia,  continue Trulicity. Depending on A1c, may need higher dose of Trulicity.   I will check testosterone levels, prostate and blood counts.We will need to repeat  testosterone testing a few weeks after restarting meds. That will be in the computer for lab only visit.   I would recommend starting lyrica if your feet burning returns.   Try tylenol for pain first - minimize use of meloxicam if possible. Follow up to discuss that medicine and pains in next 6 weeks if still needing frequent meloxicam.   Return to the clinic or go to the nearest emergency room if any of your symptoms worsen or new symptoms occur.    IF you received an x-ray today, you will receive an invoice from [facility]. Please contact [facility] at [phone] with questions or concerns regarding your invoice.   IF you received labwork today, you will receive an invoice from [facility]. Please contact [facility] at [phone] with questions or concerns regarding your invoice.   Our billing staff will not be able to assist you with questions regarding bills from these companies.  You will be contacted with the lab results as soon as they are available. The fastest way to get your results is to activate your My Chart account. Instructions are located on the last page of this paperwork. If you have not heard from [place] regarding the results in 2 weeks, please contact this office.      I personally performed the services described in this documentation, which was scribed in my presence. The recorded information has been reviewed and considered for accuracy and completeness, addended by me as needed, and agree with information above.  Signed,   [name], MD Primary Care at [facility].  04/01/17 9:08 AM

## 2017-04-01 LAB — COMPREHENSIVE METABOLIC PANEL
A/G RATIO: 2.3 — AB (ref 1.2–2.2)
ALT: 50 IU/L — ABNORMAL HIGH (ref 0–44)
AST: 36 IU/L (ref 0–40)
Albumin: 5 g/dL — ABNORMAL HIGH (ref 3.6–4.8)
Alkaline Phosphatase: 66 IU/L (ref 39–117)
BILIRUBIN TOTAL: 0.5 mg/dL (ref 0.0–1.2)
BUN / CREAT RATIO: 15 (ref 10–24)
BUN: 15 mg/dL (ref 8–27)
CALCIUM: 10.3 mg/dL — AB (ref 8.6–10.2)
CHLORIDE: 99 mmol/L (ref 96–106)
CO2: 23 mmol/L (ref 20–29)
Creatinine, Ser: 1.03 mg/dL (ref 0.76–1.27)
GFR, EST AFRICAN AMERICAN: 90 mL/min/{1.73_m2} (ref >59)
GFR, EST NON AFRICAN AMERICAN: 78 mL/min/{1.73_m2} (ref >59)
GLOBULIN, TOTAL: 2.2 g/dL (ref 1.5–4.5)
Glucose: 125 mg/dL — ABNORMAL HIGH (ref 65–99)
Potassium: 4.4 mmol/L (ref 3.5–5.2)
SODIUM: 138 mmol/L (ref 134–144)
TOTAL PROTEIN: 7.2 g/dL (ref 6.0–8.5)

## 2017-04-01 LAB — CBC
Hematocrit: 48.9 % (ref 37.5–51.0)
Hemoglobin: 16.9 g/dL (ref 13.0–17.7)
MCH: 30.6 pg (ref 26.6–33.0)
MCHC: 34.6 g/dL (ref 31.5–35.7)
MCV: 88 fL (ref 79–97)
PLATELETS: 215 10*3/uL (ref 150–379)
RBC: 5.53 x10E6/uL (ref 4.14–5.80)
RDW: 13.6 % (ref 12.3–15.4)
WBC: 7.4 10*3/uL (ref 3.4–10.8)

## 2017-04-01 LAB — PSA: Prostate Specific Ag, Serum: 0.8 ng/mL (ref 0.0–4.0)

## 2017-04-01 LAB — HEMOGLOBIN A1C
Est. average glucose Bld gHb Est-mCnc: 157 mg/dL
Hgb A1c MFr Bld: 7.1 % — ABNORMAL HIGH (ref 4.8–5.6)

## 2017-04-01 LAB — TESTOSTERONE, FREE, TOTAL, SHBG
Sex Hormone Binding: 42.3 nmol/L (ref 19.3–76.4)
TESTOSTERONE: 378 ng/dL (ref 264–916)
Testosterone, Free: 10.1 pg/mL (ref 6.6–18.1)

## 2017-04-04 ENCOUNTER — Other Ambulatory Visit: Payer: Self-pay | Admitting: Family Medicine

## 2017-04-04 DIAGNOSIS — E1165 Type 2 diabetes mellitus with hyperglycemia: Secondary | ICD-10-CM

## 2017-04-06 ENCOUNTER — Other Ambulatory Visit: Payer: Self-pay | Admitting: Family Medicine

## 2017-04-06 DIAGNOSIS — E1165 Type 2 diabetes mellitus with hyperglycemia: Secondary | ICD-10-CM

## 2017-04-06 MED ORDER — DULAGLUTIDE 1.5 MG/0.5ML ~~LOC~~ SOAJ: 1.5000 mg | SUBCUTANEOUS | 1 refills | Status: DC

## 2017-04-06 NOTE — Progress Notes (Signed)
Elevated A1c, increase in trulicity.

## 2017-05-01 NOTE — Progress Notes (Signed)
[facility] Progress Note  Chief Complaint: Right upper quadrant pain  Subjective  History:  This is a 62 year old man known to me from an office visit in February 2018 for epigastric pain. It seemed that NSAIDs might be part of the cause. Upper endoscopy revealed H. pylori infection, treated with Pylera/omeprazole. Plans were for a follow-up UBT to confirm eradication, which was negative. He has lately had intermittent right upper quadrant pain. Recent ED visit in [facility] for right flank pain and kidney stones.   CT shows obstructing right ureteral stone. (care Everywhere report found) Saw urology for lithotripsy, also follow up with [facility] and [place].  A colonoscopy with Dr. Sharlett Iles in August 2010 was normal except for some scattered diverticuli.  He describes perhaps 6 months of an intermittent vague right upper quadrant pain that might sometimes radiate to the back.  It might last hours or sometimes be low-grade and constantly present for a week or better.  It is not clearly associated with meals, time of day, but deep breaths, position or bowel movement.  He has no change in bowel habits or rectal bleeding.  Upon further consideration, he wonders if he may even have mentioned this to Dr. Sharlett Iles at the time of his last colonoscopy. He is also uncertain whether or not it started prior to his Trulicity or Evolocumab  ROS: Cardiovascular:  no chest pain Respiratory: no dyspnea Remainder systems negative except as above in history The patient's Past Medical, Family and Social History were reviewed and are on file in the EMR.  Objective:  Med list reviewed  Current Outpatient Medications:  .  aspirin 81 MG tablet, Take 81 mg by mouth daily., Disp: , Rfl:  .  Dulaglutide (TRULICITY) 1.5 LY/6.5KP SOPN, Inject 1.5 mg into the skin once a week., Disp: 6 mL, Rfl: 1 .  Evolocumab with Infusor ([place]) 420 MG/3.5ML SOCT, Inject 420 mg into the skin every 30  (thirty) days., Disp: 1 Cartridge, Rfl: 12 .  ketoconazole (NIZORAL) 2 % shampoo, APPLY TOPICALLY 2 (TWO) TIMES A WEEK., Disp: 120 mL, Rfl: 1 .  ketorolac (ACULAR) 0.5 % ophthalmic solution, Place 1 drop into both eyes 2 (two) times daily as needed., Disp: 5 mL, Rfl: 1 .  lisinopril-hydrochlorothiazide (PRINZIDE,ZESTORETIC) 20-12.5 MG tablet, Take 1 tablet by mouth daily., Disp: 90 tablet, Rfl: 1 .  meloxicam (MOBIC) 7.5 MG tablet, Take 1 tablet (7.5 mg total) by mouth daily., Disp: 30 tablet, Rfl: 1 .  nystatin cream (MYCOSTATIN), Apply 1 application topically 3 (three) times daily., Disp: 30 g, Rfl: 3 .  omeprazole (PRILOSEC) 20 MG capsule, Take 1 capsule (20 mg total) by mouth daily. (Patient taking differently: Take 20 mg by mouth as needed. ), Disp: 20 capsule, Rfl: 0 .  triamcinolone cream (KENALOG) 0.1 %, , Disp: , Rfl:  .  UNABLE TO FIND, TESTOSTERONE LIPODERM 5% (50 MG/ML) CREAM. Apply 2 ml (8 clicks) topically once a day as directed., Disp: 60 mL, Rfl: 4  Current Facility-Administered Medications:  .  0.9 %  sodium chloride infusion, 500 mL, Intravenous, Continuous, Danis, Estill Cotta III, MD   Vital signs in last 24 hrs: Vitals:   05/02/17 1501  BP: 136/72  Pulse: 80    Physical Exam  Overweight and otherwise well-appearing man accompanied by his daughter  HEENT: sclera anicteric, oral mucosa moist without lesions  Neck: supple, no thyromegaly, JVD or lymphadenopathy  Cardiac: RRR without murmurs, S1S2 heard, no peripheral edema  Pulm: clear to auscultation bilaterally, normal RR and effort noted  Abdomen: soft, no tenderness, with active bowel sounds. No guarding or palpable hepatosplenomegaly.  Rectus diastasis as before  Skin; warm and dry, no jaundice or rash  Recent Labs:  CMP Latest Ref Rng & Units 03/31/2017 11/28/2016 08/27/2016  Glucose 65 - 99 mg/dL 125(H) - 182(H)  BUN 8 - 27 mg/dL 15 - 15  Creatinine 0.76 - 1.27 mg/dL 1.03 - 1.01  Sodium 134 - 144 mmol/L 138  - 142  Potassium 3.5 - 5.2 mmol/L 4.4 - 4.4  Chloride 96 - 106 mmol/L 99 - 100  CO2 20 - 29 mmol/L 23 - 20  Calcium 8.6 - 10.2 mg/dL 10.3(H) - 10.1  Total Protein 6.0 - 8.5 g/dL 7.2 6.5 6.8  Total Bilirubin 0.0 - 1.2 mg/dL 0.5 0.6 0.4  Alkaline Phos 39 - 117 IU/L 66 - 71  AST 0 - 40 IU/L 36 20 35  ALT 0 - 44 IU/L 50(H) 36 50(H)     Radiologic studies:  Noncontrast CT urogram as noted above, report reviewed.  Images cannot be seen from outside institution  @ASSESSMENTPLANBEGIN @ Assessment: Encounter Diagnosis  Name Primary?  . RUQ pain Yes    This pain is very difficult to characterize.  It sounds like perhaps it has occurred in the past as well but may be just worse in the last 6 months.  It has no clear or consistent triggers or relieving factors.  I am going to do an ultrasound to rule out gallstones, although his symptoms are not typical for biliary colic.  I do not think a repeat upper endoscopy is warranted.  He has no associated lower GI symptoms, but if ultrasound is unrevealing, that may be necessary.  It also sounds unrelated to his recent kidney stones.  I am suspicious of possible side effect from his diabetic or lipid medication.    Total time 25  minutes, over half spent in counseling and coordination of care.   Nelida Meuse III

## 2017-05-02 ENCOUNTER — Encounter: Payer: Self-pay | Admitting: Gastroenterology

## 2017-05-02 ENCOUNTER — Ambulatory Visit: Payer: BLUE CROSS/BLUE SHIELD | Admitting: Gastroenterology

## 2017-05-02 VITALS — BP 136/72 | HR 80 | Ht 65.75 in | Wt 212.0 lb

## 2017-05-02 DIAGNOSIS — R1011 Right upper quadrant pain: Secondary | ICD-10-CM

## 2017-05-02 NOTE — Patient Instructions (Signed)
You have been scheduled for a RUQ abdominal ultrasound at [facility] (1st floor of hospital) on Thursday, 05/08/17 at 10:30 am. Please arrive 15 minutes prior to your appointment for registration. Make certain not to have anything to eat or drink 6 hours prior to your appointment. Should you need to reschedule your appointment, please contact radiology at [phone]. This test typically takes about 30 minutes to perform.  If you are age 63 or older, your body mass index should be between 23-30. Your Body mass index is 34.48 kg/m. If this is out of the aforementioned range listed, please consider follow up with your Primary Care Provider.  If you are age 56 or younger, your body mass index should be between 19-25. Your Body mass index is 34.48 kg/m. If this is out of the aformentioned range listed, please consider follow up with your Primary Care Provider.   Thank you for choosing [facility]  Dr Wilfrid Lund III

## 2017-05-07 ENCOUNTER — Ambulatory Visit: Payer: BLUE CROSS/BLUE SHIELD | Admitting: Family Medicine

## 2017-05-08 ENCOUNTER — Ambulatory Visit (HOSPITAL_COMMUNITY)
Admission: RE | Admit: 2017-05-08 | Discharge: 2017-05-08 | Disposition: A | Discharge status: Home / Self Care | Payer: BLUE CROSS/BLUE SHIELD | Source: Ambulatory Visit | Attending: Gastroenterology | Admitting: Gastroenterology

## 2017-05-08 DIAGNOSIS — K760 Fatty (change of) liver, not elsewhere classified: Secondary | ICD-10-CM | POA: Insufficient documentation

## 2017-05-08 DIAGNOSIS — R1011 Right upper quadrant pain: Secondary | ICD-10-CM

## 2017-05-15 ENCOUNTER — Telehealth: Payer: Self-pay | Admitting: Gastroenterology

## 2017-05-16 ENCOUNTER — Other Ambulatory Visit: Payer: Self-pay

## 2017-05-16 ENCOUNTER — Telehealth: Payer: Self-pay | Admitting: Gastroenterology

## 2017-05-16 DIAGNOSIS — R1011 Right upper quadrant pain: Secondary | ICD-10-CM

## 2017-05-16 NOTE — Telephone Encounter (Signed)
Called patient back, he did receive lab results and recommendations.

## 2017-05-16 NOTE — Telephone Encounter (Signed)
Patient sent me a portal message complaining of continued RUQ pain and requesting consideration of CT scan.  Please schedule CT scan abdomen only (no pelvis needed) with oral and IV contrast for RUQ pain.  - HD

## 2017-05-16 NOTE — Telephone Encounter (Signed)
CT scheduled and sent patient a MyChart message re: date/time and picking up prep.

## 2017-05-19 ENCOUNTER — Encounter: Payer: Self-pay | Admitting: Cardiovascular Disease

## 2017-05-19 ENCOUNTER — Other Ambulatory Visit: Payer: Self-pay

## 2017-05-22 ENCOUNTER — Other Ambulatory Visit: Payer: Self-pay

## 2017-05-22 ENCOUNTER — Telehealth: Payer: Self-pay

## 2017-05-22 DIAGNOSIS — R1011 Right upper quadrant pain: Secondary | ICD-10-CM

## 2017-05-22 NOTE — Telephone Encounter (Signed)
[facility] called stating they need an update CMP to be completed prior to CT next week. Left a voicemail to have him call back.

## 2017-05-26 ENCOUNTER — Other Ambulatory Visit (INDEPENDENT_AMBULATORY_CARE_PROVIDER_SITE_OTHER): Payer: BLUE CROSS/BLUE SHIELD

## 2017-05-26 DIAGNOSIS — R1011 Right upper quadrant pain: Secondary | ICD-10-CM | POA: Diagnosis not present

## 2017-05-26 LAB — COMPREHENSIVE METABOLIC PANEL
ALK PHOS: 53 U/L (ref 39–117)
ALT: 51 U/L (ref 0–53)
AST: 25 U/L (ref 0–37)
Albumin: 3.8 g/dL (ref 3.5–5.2)
BUN: 13 mg/dL (ref 6–23)
CO2: 31 meq/L (ref 19–32)
Calcium: 9.4 mg/dL (ref 8.4–10.5)
Chloride: 100 mEq/L (ref 96–112)
Creatinine, Ser: 0.97 mg/dL (ref 0.40–1.50)
GFR: 83.58 mL/min (ref >60.00)
GLUCOSE: 246 mg/dL — AB (ref 70–99)
POTASSIUM: 4.1 meq/L (ref 3.5–5.1)
SODIUM: 137 meq/L (ref 135–145)
Total Bilirubin: 0.7 mg/dL (ref 0.2–1.2)
Total Protein: 6.6 g/dL (ref 6.0–8.3)

## 2017-05-26 NOTE — Telephone Encounter (Signed)
Left a 2nd message to have labs drawn.

## 2017-05-27 ENCOUNTER — Ambulatory Visit: Payer: BLUE CROSS/BLUE SHIELD | Admitting: Gastroenterology

## 2017-05-27 ENCOUNTER — Ambulatory Visit (HOSPITAL_COMMUNITY)
Admission: RE | Admit: 2017-05-27 | Discharge: 2017-05-27 | Disposition: A | Discharge status: Home / Self Care | Payer: BLUE CROSS/BLUE SHIELD | Source: Ambulatory Visit | Attending: Gastroenterology | Admitting: Gastroenterology

## 2017-05-27 ENCOUNTER — Encounter (HOSPITAL_COMMUNITY): Payer: Self-pay

## 2017-05-27 DIAGNOSIS — K760 Fatty (change of) liver, not elsewhere classified: Secondary | ICD-10-CM | POA: Diagnosis not present

## 2017-05-27 DIAGNOSIS — R1011 Right upper quadrant pain: Secondary | ICD-10-CM | POA: Insufficient documentation

## 2017-05-27 MED ORDER — IOPAMIDOL (ISOVUE-300) INJECTION 61%
100.0000 mL | Freq: Once | INTRAVENOUS | Status: AC | PRN
  Administered 2017-05-27: 100 mL via INTRAVENOUS

## 2017-05-27 MED ORDER — IOPAMIDOL (ISOVUE-300) INJECTION 61%
INTRAVENOUS | Status: AC
  Filled 2017-05-27: qty 100

## 2017-05-28 NOTE — Telephone Encounter (Signed)
Labs were completed on 05-26-2017.

## 2017-06-10 ENCOUNTER — Encounter: Payer: Self-pay | Admitting: Family Medicine

## 2017-06-10 ENCOUNTER — Ambulatory Visit: Payer: BLUE CROSS/BLUE SHIELD | Admitting: Cardiovascular Disease

## 2017-06-10 VITALS — BP 126/88 | HR 76 | Ht 65.0 in | Wt 206.8 lb

## 2017-06-10 DIAGNOSIS — E782 Mixed hyperlipidemia: Secondary | ICD-10-CM

## 2017-06-10 DIAGNOSIS — I10 Essential (primary) hypertension: Secondary | ICD-10-CM

## 2017-06-10 DIAGNOSIS — R0789 Other chest pain: Secondary | ICD-10-CM | POA: Diagnosis not present

## 2017-06-10 NOTE — Patient Instructions (Signed)
Medication Instructions:  No medication changes    Follow-Up: Your physician wants you to follow-up in: 12 months with Dr. Fletcher Anon. You will receive a reminder letter in the mail two months in advance. If you don't receive a letter, please call our office to schedule the follow-up appointment.   Any Other Special Instructions Will Be Listed Below (If Applicable).     If you need a refill on your cardiac medications before your next appointment, please call your pharmacy.

## 2017-06-10 NOTE — Progress Notes (Signed)
Cardiology Office Note   Date:  06/11/2017   ID:  Francis Cross, DOB March 01, 1956, MRN [id number]  PCP:  [name], MD  Cardiologist:   Kathlyn Sacramento, MD   No chief complaint on file.     History of Present Illness: Francis Cross is a 62 y.o. male who is here today for management of Severe hyperlipidemia. He has known history of hypertension, hyperlipidemia and type 2 diabetes. He was seen by me in 2015 for atypical chest pain. He underwent a nuclear stress test which showed no evidence of ischemia with normal ejection fraction. He had few episodes of brief left-sided chest pain recently described as twinges lasting for a few seconds. These episodes are random and they do not happen with physical activities. Otherwise he has no shortness of breath, orthopnea or PND. He has known history of severe hyperlipidemia with intolerance to statins and Zetia.  Due to diabetes and LDL above 200, he was started on Repatha last year. Since last year, he developed kidney stones that they were treated.  He also started having lower back pain and was diagnosed with bulging disks.  He has known history of chronic atypical chest pain with no significant worsening.  He has no exertional symptoms.    Past Medical History:  Diagnosis Date  . Acid reflux   . Cancer ([place])    skin cancere  . Diabetes mellitus without complication ([place])   . Erectile dysfunction   . Gout   . Hemochromatosis   . Hiatal hernia   . Hyperlipidemia   . Hypertension   . Iron overload   . Kidney stone   . Obesity     Past Surgical History:  Procedure Laterality Date  . APPENDECTOMY    . CARDIAC CATHETERIZATION     [place]    . KIDNEY STONE SURGERY  03/07/2017   laser blast  . TONSILLECTOMY       Current Outpatient Medications  Medication Sig Dispense Refill  . aspirin 81 MG tablet Take 81 mg by mouth daily.    . Dulaglutide (TRULICITY) 1.5 GE/9.5MW SOPN Inject 1.5 mg into the skin once a  week. 6 mL 1  . Evolocumab with Infusor ([place]) 420 MG/3.5ML SOCT Inject 420 mg into the skin every 30 (thirty) days. 1 Cartridge 12  . ketoconazole (NIZORAL) 2 % shampoo APPLY TOPICALLY 2 (TWO) TIMES A WEEK. 120 mL 1  . ketorolac (ACULAR) 0.5 % ophthalmic solution Place 1 drop into both eyes 2 (two) times daily as needed. 5 mL 1  . lisinopril-hydrochlorothiazide (PRINZIDE,ZESTORETIC) 20-12.5 MG tablet Take 1 tablet by mouth daily. 90 tablet 1  . meloxicam (MOBIC) 7.5 MG tablet Take 1 tablet (7.5 mg total) by mouth daily. 30 tablet 1  . nystatin cream (MYCOSTATIN) Apply 1 application topically 3 (three) times daily. 30 g 3  . omeprazole (PRILOSEC) 20 MG capsule Take 1 capsule (20 mg total) by mouth daily. (Patient taking differently: Take 20 mg by mouth as needed. ) 20 capsule 0  . triamcinolone cream (KENALOG) 0.1 %     . UNABLE TO FIND TESTOSTERONE LIPODERM 5% (50 MG/ML) CREAM. Apply 2 ml (8 clicks) topically once a day as directed. 60 mL 4   Current Facility-Administered Medications  Medication Dose Route Frequency Provider Last Rate Last Dose  . 0.9 %  sodium chloride infusion  500 mL Intravenous Continuous Doran Stabler, MD        Allergies:  Cephalexin and Penicillins    Social History:  The patient  reports that he has quit smoking. He has quit using smokeless tobacco. He reports that he drinks alcohol. He reports that he does not use drugs.   Family History:  The patient's family history includes Cancer in his father and mother; Hyperlipidemia in his sister; Hypertension in his sister.    ROS:  Please see the history of present illness.   Otherwise, review of systems are positive for none.   All other systems are reviewed and negative.    PHYSICAL EXAM: VS:  BP 126/88 (BP Location: Left Arm)   Pulse 76   Ht 5\' 5"  (1.651 m)   Wt 206 lb 12.8 oz (93.8 kg)   BMI 34.41 kg/m  , BMI Body mass index is 34.41 kg/m. GEN: Well nourished, well developed, in no  acute distress  HEENT: normal  Neck: no JVD, carotid bruits, or masses Cardiac: RRR; no murmurs, rubs, or gallops,no edema  Respiratory:  clear to auscultation bilaterally, normal work of breathing GI: soft, nontender, nondistended, + BS MS: no deformity or atrophy  Skin: warm and dry, no rash Neuro:  Strength and sensation are intact Psych: euthymic mood, full affect   EKG:  EKG is ordered today. EKG showed normal sinus rhythm with no significant ST or T wave changes.   Recent Labs: 03/31/2017: Hemoglobin 16.9; Platelets 215 05/26/2017: ALT 51; BUN 13; Creatinine, Ser 0.97; Potassium 4.1; Sodium 137    Lipid Panel    Component Value Date/Time   CHOL 121 11/28/2016 0839   CHOL 233 (H) 08/27/2016 0955   TRIG 150 (H) 11/28/2016 0839   HDL 46 11/28/2016 0839   HDL 43 08/27/2016 0955   CHOLHDL 2.6 11/28/2016 0839   VLDL 29 07/16/2016 1034   LDLCALC 52 11/28/2016 0839   LDLDIRECT 152.1 10/12/2008 0726      Wt Readings from Last 3 Encounters:  06/10/17 206 lb 12.8 oz (93.8 kg)  05/02/17 212 lb (96.2 kg)  03/31/17 208 lb 9.6 oz (94.6 kg)       No flowsheet data found.    ASSESSMENT AND PLAN:  1.  Severe hyperlipidemia: Significant improvement with Repatha with most recent LDL of 52.  Triglycerides also improved to 150.  Continue same treatment.  No side effects.  2.  Chronic atypical chest pain: No exertional symptoms.  EKG is normal.  If symptoms become more frequent, CTA of the coronary arteries can be considered.  3.  Essential hypertension: Blood pressure is controlled on current medications.  4.  If the patient requires back surgery, he is considered at low risk from a cardiac standpoint.   Disposition:   FU with me in 1 year  Signed,  Kathlyn Sacramento, MD  06/11/2017 8:21 AM    [place]

## 2017-06-13 ENCOUNTER — Other Ambulatory Visit: Payer: Self-pay | Admitting: Family Medicine

## 2017-06-13 DIAGNOSIS — E785 Hyperlipidemia, unspecified: Secondary | ICD-10-CM

## 2017-06-13 DIAGNOSIS — E1140 Type 2 diabetes mellitus with diabetic neuropathy, unspecified: Secondary | ICD-10-CM

## 2017-06-13 NOTE — Progress Notes (Signed)
See [employer] email. Future labs for upcoming visit.

## 2017-06-26 DIAGNOSIS — M51369 Other intervertebral disc degeneration, lumbar region without mention of lumbar back pain or lower extremity pain: Secondary | ICD-10-CM | POA: Insufficient documentation

## 2017-06-26 DIAGNOSIS — M5136 Other intervertebral disc degeneration, lumbar region: Secondary | ICD-10-CM | POA: Insufficient documentation

## 2017-07-07 ENCOUNTER — Encounter: Payer: Self-pay | Admitting: Cardiovascular Disease

## 2017-07-09 ENCOUNTER — Telehealth: Payer: Self-pay | Admitting: Cardiovascular Disease

## 2017-07-09 ENCOUNTER — Encounter (INDEPENDENT_AMBULATORY_CARE_PROVIDER_SITE_OTHER): Payer: Self-pay

## 2017-07-09 DIAGNOSIS — R0789 Other chest pain: Secondary | ICD-10-CM

## 2017-07-09 MED ORDER — METOPROLOL TARTRATE 50 MG PO TABS: ORAL_TABLET | ORAL | 0 refills | Status: DC

## 2017-07-09 NOTE — Telephone Encounter (Signed)
Great. Water quality scientist.

## 2017-07-09 NOTE — Telephone Encounter (Signed)
My chart message received from the patient's daughter:      11:07 PM  Dr Fletcher Anon  Can we go ahead and proceed with the CT angiogram you mentioned at most recent visit ? Dad still has those pains at times and would just like to be sure.  He has had his back injection and has appointment with surgeon if he fails to respond to the steroid injection. Could we go ahead and get it set up ?  Thanks again  Francis Cross   I advised the patient's daughter via [place], that I will go ahead and place the order for his Cardiac CTA. Confirmed no contrast dye allergy. He is currently not on a beta blocker- will send in a 1 time dose of metoprolol tartrate 50 mg to be taken 1 hour prior to his exam.  Copy of instructions as below forwarded to the patient via [place]. Will notify Dr. Fletcher Anon that the patient has agreed to the cardiac CTA.  The patient's daughter is aware she will be contacted by scheduling to arrange his test date/ time, but this can take a few weeks- she is agreeable.   Please arrive at the [facility] main entrance of [facility] at __________- AM (30-45 minutes prior to test start time)  [facility] [address] [place], [place] [address] [phone]  Proceed to the [facility] Radiology Department (First Floor).  Please follow these instructions carefully (unless otherwise directed):  Hold all erectile dysfunction medications at least 48 hours prior to test.  On the Night Before the Test: . Drink plenty of water. . Do not consume any caffeinated/decaffeinated beverages or chocolate 12 hours prior to your test. . Do not take any antihistamines 12 hours prior to your test. . If you take Metformin do not take 24 hours prior to test.   On the Day of the Test: . Drink plenty of water. Do not drink any water within one hour of the test. . Do not eat any food 4 hours prior to the test. . You may take your regular medications prior to the test. . IF NOT ON A  BETA BLOCKER - Take 50 mg of lopressor (metoprolol) one hour before the test. (this has been sent to your pharmacy) . HOLD Furosemide morning of the test, if taking  After the Test: . Drink plenty of water. . After receiving IV contrast, you may experience a mild flushed feeling. This is normal. . On occasion, you may experience a mild rash up to 24 hours after the test. This is not dangerous. If this occurs, you can take Benadryl 25 mg and increase your fluid intake. . If you experience trouble breathing, this can be serious. If it is severe call 911 IMMEDIATELY. If it is mild, please call our office. . If you take any of these medications: Glipizide/Metformin, Avandament, Glucavance, please do not take 48 hours after completing test.

## 2017-07-18 ENCOUNTER — Other Ambulatory Visit: Payer: Self-pay | Admitting: Cardiovascular Disease

## 2017-07-18 NOTE — Telephone Encounter (Signed)
Please review for refill.  

## 2017-07-22 ENCOUNTER — Encounter: Payer: Self-pay | Admitting: Cardiovascular Disease

## 2017-07-25 ENCOUNTER — Ambulatory Visit (INDEPENDENT_AMBULATORY_CARE_PROVIDER_SITE_OTHER): Payer: BLUE CROSS/BLUE SHIELD | Admitting: Urgent Care

## 2017-07-25 DIAGNOSIS — E291 Testicular hypofunction: Secondary | ICD-10-CM

## 2017-07-25 DIAGNOSIS — E785 Hyperlipidemia, unspecified: Secondary | ICD-10-CM

## 2017-07-25 DIAGNOSIS — E1140 Type 2 diabetes mellitus with diabetic neuropathy, unspecified: Secondary | ICD-10-CM

## 2017-07-25 NOTE — Progress Notes (Signed)
This is a nurse visit, I did not examine this patient.

## 2017-07-31 LAB — COMPREHENSIVE METABOLIC PANEL
ALBUMIN: 5.1 g/dL — AB (ref 3.6–4.8)
ALK PHOS: 71 IU/L (ref 39–117)
ALT: 61 IU/L — ABNORMAL HIGH (ref 0–44)
AST: 42 IU/L — AB (ref 0–40)
Albumin/Globulin Ratio: 2.1 (ref 1.2–2.2)
BILIRUBIN TOTAL: 0.4 mg/dL (ref 0.0–1.2)
BUN / CREAT RATIO: 15 (ref 10–24)
BUN: 17 mg/dL (ref 8–27)
CHLORIDE: 100 mmol/L (ref 96–106)
CO2: 23 mmol/L (ref 20–29)
CREATININE: 1.11 mg/dL (ref 0.76–1.27)
Calcium: 9.7 mg/dL (ref 8.6–10.2)
GFR calc Af Amer: 82 mL/min/{1.73_m2} (ref >59)
GFR calc non Af Amer: 71 mL/min/{1.73_m2} (ref >59)
GLUCOSE: 182 mg/dL — AB (ref 65–99)
Globulin, Total: 2.4 g/dL (ref 1.5–4.5)
Potassium: 4.2 mmol/L (ref 3.5–5.2)
Sodium: 139 mmol/L (ref 134–144)
Total Protein: 7.5 g/dL (ref 6.0–8.5)

## 2017-07-31 LAB — LIPID PANEL
Chol/HDL Ratio: 2.6 ratio (ref 0.0–5.0)
Cholesterol, Total: 129 mg/dL (ref 100–199)
HDL: 50 mg/dL (ref >39)
LDL Calculated: 49 mg/dL (ref 0–99)
TRIGLYCERIDES: 148 mg/dL (ref 0–149)
VLDL CHOLESTEROL CAL: 30 mg/dL (ref 5–40)

## 2017-07-31 LAB — TESTOSTERONE,FREE AND TOTAL
TESTOSTERONE FREE: 4.3 pg/mL — AB (ref 6.6–18.1)
Testosterone: 295 ng/dL (ref 264–916)

## 2017-07-31 LAB — HEMOGLOBIN A1C
ESTIMATED AVERAGE GLUCOSE: 200 mg/dL
HEMOGLOBIN A1C: 8.6 % — AB (ref 4.8–5.6)

## 2017-08-07 ENCOUNTER — Telehealth: Payer: Self-pay | Admitting: *Deleted

## 2017-08-07 NOTE — Telephone Encounter (Signed)
Received prior authorization request for Repatha. Called and s/w [employer] representative. ICD 10 code for hyperlipidemia E.78.5 given. Request submitted by rep and said determination response would be faxed to office with 24-72 business hours. Awaiting response.

## 2017-08-08 NOTE — Telephone Encounter (Signed)
Fax received from [employer] that the patient's Repatha has been approved from 08/07/17-08/08/18.

## 2017-08-10 ENCOUNTER — Encounter: Payer: Self-pay | Admitting: Family Medicine

## 2017-08-10 ENCOUNTER — Encounter: Payer: Self-pay | Admitting: Cardiovascular Disease

## 2017-08-14 ENCOUNTER — Encounter: Payer: Self-pay | Admitting: Family Medicine

## 2017-08-14 ENCOUNTER — Other Ambulatory Visit: Payer: Self-pay

## 2017-08-14 DIAGNOSIS — Z808 Family history of malignant neoplasm of other organs or systems: Secondary | ICD-10-CM

## 2017-08-14 DIAGNOSIS — D239 Other benign neoplasm of skin, unspecified: Secondary | ICD-10-CM

## 2017-08-14 DIAGNOSIS — C4491 Basal cell carcinoma of skin, unspecified: Secondary | ICD-10-CM

## 2017-09-09 HISTORY — PX: BACK SURGERY: SHX140

## 2017-09-18 ENCOUNTER — Ambulatory Visit (HOSPITAL_COMMUNITY): Admission: RE | Admit: 2017-09-18 | Payer: BLUE CROSS/BLUE SHIELD | Source: Ambulatory Visit

## 2017-09-18 ENCOUNTER — Other Ambulatory Visit (HOSPITAL_COMMUNITY): Payer: Self-pay | Admitting: Neurological Surgery

## 2017-09-18 DIAGNOSIS — R29898 Other symptoms and signs involving the musculoskeletal system: Secondary | ICD-10-CM

## 2017-09-22 ENCOUNTER — Other Ambulatory Visit: Payer: Self-pay

## 2017-09-22 ENCOUNTER — Other Ambulatory Visit: Payer: Self-pay | Admitting: Neurological Surgery

## 2017-09-22 ENCOUNTER — Encounter (HOSPITAL_COMMUNITY): Payer: Self-pay | Admitting: *Deleted

## 2017-09-22 NOTE — Progress Notes (Signed)
Spoke with pt for pre-op call. Pt denies cardiac history or chest pain. Pt is a type 2 diabetic. Last A1C ws 8.6 on 07/25/17. Pt states his fasting blood sugar is usually between 120-130. Only diabetic medication pt takes is Trulicity once a week. Pt states he was told last week when he had surgery at the [facility] that he was a difficult intubation because he had a short cricoid and he would always need a scope and camera. Have requested Anesthesia record from that visit. Karoline Caldwell, NP notified to review chart.  Instructed pt to check his blood sugar in the AM when he gets up and every 2 hours until he leaves for the hospital. If blood sugar is 70 or below, treat with 1/2 cup of clear juice (apple or cranberry) and recheck blood sugar 15 minutes after drinking juice. If blood sugar continues to be 70 or below, call the Short Stay department and ask to speak to a nurse. Pt voiced understanding.

## 2017-09-23 ENCOUNTER — Observation Stay (HOSPITAL_COMMUNITY)
Admission: RE | Admit: 2017-09-23 | Discharge: 2017-09-24 | Disposition: A | Discharge status: Home / Self Care | Payer: BLUE CROSS/BLUE SHIELD | Source: Ambulatory Visit | Attending: Neurological Surgery | Admitting: Neurological Surgery

## 2017-09-23 ENCOUNTER — Ambulatory Visit (HOSPITAL_COMMUNITY)
Admission: RE | Disposition: A | Discharge status: Home / Self Care | Payer: Self-pay | Source: Ambulatory Visit | Attending: Neurological Surgery

## 2017-09-23 ENCOUNTER — Ambulatory Visit (HOSPITAL_COMMUNITY): Payer: BLUE CROSS/BLUE SHIELD | Admitting: Physician Assistant

## 2017-09-23 ENCOUNTER — Other Ambulatory Visit: Payer: Self-pay

## 2017-09-23 ENCOUNTER — Encounter (HOSPITAL_COMMUNITY): Payer: Self-pay | Admitting: Anesthesiology

## 2017-09-23 DIAGNOSIS — E785 Hyperlipidemia, unspecified: Secondary | ICD-10-CM | POA: Diagnosis not present

## 2017-09-23 DIAGNOSIS — Z6834 Body mass index (BMI) 34.0-34.9, adult: Secondary | ICD-10-CM | POA: Diagnosis not present

## 2017-09-23 DIAGNOSIS — Z85828 Personal history of other malignant neoplasm of skin: Secondary | ICD-10-CM | POA: Insufficient documentation

## 2017-09-23 DIAGNOSIS — M5126 Other intervertebral disc displacement, lumbar region: Secondary | ICD-10-CM | POA: Diagnosis present

## 2017-09-23 DIAGNOSIS — Z79899 Other long term (current) drug therapy: Secondary | ICD-10-CM | POA: Diagnosis not present

## 2017-09-23 DIAGNOSIS — Z888 Allergy status to other drugs, medicaments and biological substances status: Secondary | ICD-10-CM | POA: Insufficient documentation

## 2017-09-23 DIAGNOSIS — K219 Gastro-esophageal reflux disease without esophagitis: Secondary | ICD-10-CM | POA: Diagnosis not present

## 2017-09-23 DIAGNOSIS — E669 Obesity, unspecified: Secondary | ICD-10-CM | POA: Diagnosis not present

## 2017-09-23 DIAGNOSIS — I10 Essential (primary) hypertension: Secondary | ICD-10-CM | POA: Insufficient documentation

## 2017-09-23 DIAGNOSIS — Z880 Allergy status to penicillin: Secondary | ICD-10-CM | POA: Insufficient documentation

## 2017-09-23 DIAGNOSIS — K760 Fatty (change of) liver, not elsewhere classified: Secondary | ICD-10-CM | POA: Insufficient documentation

## 2017-09-23 DIAGNOSIS — N529 Male erectile dysfunction, unspecified: Secondary | ICD-10-CM | POA: Diagnosis not present

## 2017-09-23 DIAGNOSIS — M5116 Intervertebral disc disorders with radiculopathy, lumbar region: Principal | ICD-10-CM | POA: Insufficient documentation

## 2017-09-23 DIAGNOSIS — E119 Type 2 diabetes mellitus without complications: Secondary | ICD-10-CM | POA: Diagnosis not present

## 2017-09-23 DIAGNOSIS — Z87891 Personal history of nicotine dependence: Secondary | ICD-10-CM | POA: Insufficient documentation

## 2017-09-23 DIAGNOSIS — R1013 Epigastric pain: Secondary | ICD-10-CM

## 2017-09-23 HISTORY — DX: Fatty (change of) liver, not elsewhere classified: K76.0

## 2017-09-23 HISTORY — PX: LUMBAR LAMINECTOMY/DECOMPRESSION MICRODISCECTOMY: SHX5026

## 2017-09-23 HISTORY — DX: Personal history of urinary calculi: Z87.442

## 2017-09-23 HISTORY — DX: Failed or difficult intubation, initial encounter: T88.4XXA

## 2017-09-23 LAB — BASIC METABOLIC PANEL
Anion gap: 12 (ref 5–15)
BUN: 17 mg/dL (ref 8–23)
CALCIUM: 9.2 mg/dL (ref 8.9–10.3)
CO2: 29 mmol/L (ref 22–32)
Chloride: 93 mmol/L — ABNORMAL LOW (ref 98–111)
Creatinine, Ser: 0.88 mg/dL (ref 0.61–1.24)
GFR calc Af Amer: >60 mL/min (ref >60)
GLUCOSE: 246 mg/dL — AB (ref 70–99)
Potassium: 3.4 mmol/L — ABNORMAL LOW (ref 3.5–5.1)
Sodium: 134 mmol/L — ABNORMAL LOW (ref 135–145)

## 2017-09-23 LAB — URINALYSIS, ROUTINE W REFLEX MICROSCOPIC
BILIRUBIN URINE: NEGATIVE
Glucose, UA: >=500 mg/dL — AB
KETONES UR: NEGATIVE mg/dL
NITRITE: NEGATIVE
PH: 6 (ref 5.0–8.0)
PROTEIN: NEGATIVE mg/dL
Specific Gravity, Urine: 1.01 (ref 1.005–1.030)

## 2017-09-23 LAB — GLUCOSE, CAPILLARY
GLUCOSE-CAPILLARY: 257 mg/dL — AB (ref 70–99)
GLUCOSE-CAPILLARY: 425 mg/dL — AB (ref 70–99)
Glucose-Capillary: 268 mg/dL — ABNORMAL HIGH (ref 70–99)
Glucose-Capillary: 292 mg/dL — ABNORMAL HIGH (ref 70–99)
Glucose-Capillary: 454 mg/dL — ABNORMAL HIGH (ref 70–99)

## 2017-09-23 LAB — HEMOGLOBIN A1C
Hgb A1c MFr Bld: 9.2 % — ABNORMAL HIGH (ref 4.8–5.6)
Mean Plasma Glucose: 217.34 mg/dL

## 2017-09-23 LAB — CBC
HEMATOCRIT: 47 % (ref 39.0–52.0)
Hemoglobin: 16 g/dL (ref 13.0–17.0)
MCH: 29.9 pg (ref 26.0–34.0)
MCHC: 34 g/dL (ref 30.0–36.0)
MCV: 87.9 fL (ref 78.0–100.0)
PLATELETS: 172 10*3/uL (ref 150–400)
RBC: 5.35 MIL/uL (ref 4.22–5.81)
RDW: 11.5 % (ref 11.5–15.5)
WBC: 15.7 10*3/uL — AB (ref 4.0–10.5)

## 2017-09-23 SURGERY — LUMBAR LAMINECTOMY/DECOMPRESSION MICRODISCECTOMY 1 LEVEL
Anesthesia: General | Site: Spine Lumbar

## 2017-09-23 MED ORDER — SODIUM CHLORIDE 0.9 % IV SOLN: 250.0000 mL | INTRAVENOUS | Status: DC

## 2017-09-23 MED ORDER — GABAPENTIN 300 MG PO CAPS
300.0000 mg | ORAL_CAPSULE | Freq: Three times a day (TID) | ORAL | Status: DC
  Filled 2017-09-23 (×3): qty 1

## 2017-09-23 MED ORDER — THROMBIN (RECOMBINANT) 5000 UNITS EX SOLR
CUTANEOUS | Status: AC
  Filled 2017-09-23: qty 10000

## 2017-09-23 MED ORDER — PREDNISONE 20 MG PO TABS
40.0000 mg | ORAL_TABLET | Freq: Every day | ORAL | Status: AC
  Administered 2017-09-23 – 2017-09-24 (×2): 40 mg via ORAL
  Filled 2017-09-23 (×2): qty 2

## 2017-09-23 MED ORDER — OXYCODONE-ACETAMINOPHEN 7.5-325 MG PO TABS: 1.0000 | ORAL_TABLET | ORAL | 0 refills | Status: DC | PRN

## 2017-09-23 MED ORDER — MIDAZOLAM HCL 2 MG/2ML IJ SOLN
INTRAMUSCULAR | Status: AC
  Filled 2017-09-23: qty 2

## 2017-09-23 MED ORDER — LACTATED RINGERS IV SOLN
INTRAVENOUS | Status: DC
  Administered 2017-09-23 (×3): via INTRAVENOUS

## 2017-09-23 MED ORDER — ONDANSETRON HCL 4 MG/2ML IJ SOLN
INTRAMUSCULAR | Status: DC | PRN
  Administered 2017-09-23: 4 mg via INTRAVENOUS

## 2017-09-23 MED ORDER — ALUM & MAG HYDROXIDE-SIMETH 200-200-20 MG/5ML PO SUSP: 30.0000 mL | Freq: Four times a day (QID) | ORAL | Status: DC | PRN

## 2017-09-23 MED ORDER — HEMOSTATIC AGENTS (NO CHARGE) OPTIME
TOPICAL | Status: DC | PRN
  Administered 2017-09-23: 1 via TOPICAL

## 2017-09-23 MED ORDER — FENTANYL CITRATE (PF) 250 MCG/5ML IJ SOLN
INTRAMUSCULAR | Status: AC
  Filled 2017-09-23: qty 5

## 2017-09-23 MED ORDER — DEXAMETHASONE SODIUM PHOSPHATE 10 MG/ML IJ SOLN
INTRAMUSCULAR | Status: DC | PRN
  Administered 2017-09-23: 8 mg via INTRAVENOUS

## 2017-09-23 MED ORDER — BUPIVACAINE HCL (PF) 0.5 % IJ SOLN
INTRAMUSCULAR | Status: AC
  Filled 2017-09-23: qty 30

## 2017-09-23 MED ORDER — SODIUM CHLORIDE 0.9% FLUSH: 3.0000 mL | Freq: Two times a day (BID) | INTRAVENOUS | Status: DC

## 2017-09-23 MED ORDER — SUCCINYLCHOLINE CHLORIDE 20 MG/ML IJ SOLN
INTRAMUSCULAR | Status: DC | PRN
  Administered 2017-09-23: 120 mg via INTRAVENOUS

## 2017-09-23 MED ORDER — PREGABALIN 75 MG PO CAPS
75.0000 mg | ORAL_CAPSULE | Freq: Two times a day (BID) | ORAL | Status: DC
  Administered 2017-09-23: 75 mg via ORAL
  Filled 2017-09-23: qty 1

## 2017-09-23 MED ORDER — KETOROLAC TROMETHAMINE 15 MG/ML IJ SOLN
15.0000 mg | Freq: Four times a day (QID) | INTRAMUSCULAR | Status: DC
  Administered 2017-09-23 – 2017-09-24 (×3): 15 mg via INTRAVENOUS
  Filled 2017-09-23 (×3): qty 1

## 2017-09-23 MED ORDER — INSULIN ASPART 100 UNIT/ML ~~LOC~~ SOLN
0.0000 [IU] | Freq: Every day | SUBCUTANEOUS | Status: DC
  Administered 2017-09-23: 5 [IU] via SUBCUTANEOUS

## 2017-09-23 MED ORDER — LISINOPRIL-HYDROCHLOROTHIAZIDE 20-12.5 MG PO TABS: 1.0000 | ORAL_TABLET | Freq: Every day | ORAL | Status: DC

## 2017-09-23 MED ORDER — SODIUM CHLORIDE 0.9 % IV SOLN
INTRAVENOUS | Status: DC | PRN
  Administered 2017-09-23: 500 mL

## 2017-09-23 MED ORDER — PROPOFOL 10 MG/ML IV BOLUS
INTRAVENOUS | Status: AC
  Filled 2017-09-23: qty 20

## 2017-09-23 MED ORDER — INSULIN ASPART 100 UNIT/ML ~~LOC~~ SOLN: 0.0000 [IU] | Freq: Three times a day (TID) | SUBCUTANEOUS | Status: DC

## 2017-09-23 MED ORDER — LISINOPRIL 20 MG PO TABS: 20.0000 mg | ORAL_TABLET | Freq: Every day | ORAL | Status: DC

## 2017-09-23 MED ORDER — INSULIN ASPART 100 UNIT/ML ~~LOC~~ SOLN
SUBCUTANEOUS | Status: AC
  Filled 2017-09-23: qty 1

## 2017-09-23 MED ORDER — HYDROCHLOROTHIAZIDE 12.5 MG PO CAPS: 12.5000 mg | ORAL_CAPSULE | Freq: Every day | ORAL | Status: DC

## 2017-09-23 MED ORDER — FENTANYL CITRATE (PF) 100 MCG/2ML IJ SOLN: 25.0000 ug | INTRAMUSCULAR | Status: DC | PRN

## 2017-09-23 MED ORDER — PANTOPRAZOLE SODIUM 40 MG PO TBEC
40.0000 mg | DELAYED_RELEASE_TABLET | Freq: Every day | ORAL | Status: DC
Start: 2017-09-24 — End: 2017-09-24

## 2017-09-23 MED ORDER — ONDANSETRON HCL 4 MG/2ML IJ SOLN: 4.0000 mg | Freq: Four times a day (QID) | INTRAMUSCULAR | Status: DC | PRN

## 2017-09-23 MED ORDER — ONDANSETRON HCL 4 MG PO TABS
4.0000 mg | ORAL_TABLET | Freq: Four times a day (QID) | ORAL | Status: DC | PRN
Start: 2017-09-23 — End: 2017-09-24

## 2017-09-23 MED ORDER — INSULIN ASPART 100 UNIT/ML ~~LOC~~ SOLN
0.0000 [IU] | Freq: Three times a day (TID) | SUBCUTANEOUS | Status: DC
  Administered 2017-09-23 (×2): 11 [IU] via SUBCUTANEOUS
  Administered 2017-09-24: 20 [IU] via SUBCUTANEOUS

## 2017-09-23 MED ORDER — PREDNISONE 10 MG (48) PO TBPK
20.0000 mg | ORAL_TABLET | ORAL | Status: DC
Start: 2017-09-23 — End: 2017-09-23

## 2017-09-23 MED ORDER — SUGAMMADEX SODIUM 200 MG/2ML IV SOLN
INTRAVENOUS | Status: AC
  Filled 2017-09-23: qty 2

## 2017-09-23 MED ORDER — CEFAZOLIN SODIUM-DEXTROSE 2-3 GM-%(50ML) IV SOLR
INTRAVENOUS | Status: DC | PRN
  Administered 2017-09-23: 2 g via INTRAVENOUS

## 2017-09-23 MED ORDER — SENNA 8.6 MG PO TABS
1.0000 | ORAL_TABLET | Freq: Two times a day (BID) | ORAL | Status: DC
  Administered 2017-09-23: 8.6 mg via ORAL
  Filled 2017-09-23: qty 1

## 2017-09-23 MED ORDER — LIDOCAINE 2% (20 MG/ML) 5 ML SYRINGE
INTRAMUSCULAR | Status: AC
  Filled 2017-09-23: qty 5

## 2017-09-23 MED ORDER — CYCLOBENZAPRINE HCL 10 MG PO TABS
10.0000 mg | ORAL_TABLET | Freq: Three times a day (TID) | ORAL | Status: DC | PRN
  Administered 2017-09-23 – 2017-09-24 (×2): 10 mg via ORAL
  Filled 2017-09-23 (×2): qty 1

## 2017-09-23 MED ORDER — ROCURONIUM BROMIDE 100 MG/10ML IV SOLN
INTRAVENOUS | Status: DC | PRN
  Administered 2017-09-23: 50 mg via INTRAVENOUS
  Administered 2017-09-23: 10 mg via INTRAVENOUS

## 2017-09-23 MED ORDER — OXYCODONE-ACETAMINOPHEN 5-325 MG PO TABS
1.0000 | ORAL_TABLET | ORAL | Status: DC | PRN
  Administered 2017-09-23 – 2017-09-24 (×2): 2 via ORAL
  Filled 2017-09-23 (×2): qty 2

## 2017-09-23 MED ORDER — SUCCINYLCHOLINE CHLORIDE 200 MG/10ML IV SOSY
PREFILLED_SYRINGE | INTRAVENOUS | Status: AC
  Filled 2017-09-23: qty 10

## 2017-09-23 MED ORDER — DIAZEPAM 5 MG PO TABS: 5.0000 mg | ORAL_TABLET | Freq: Four times a day (QID) | ORAL | Status: DC | PRN

## 2017-09-23 MED ORDER — LIDOCAINE-EPINEPHRINE 1 %-1:100000 IJ SOLN
INTRAMUSCULAR | Status: AC
  Filled 2017-09-23: qty 1

## 2017-09-23 MED ORDER — SODIUM CHLORIDE 0.9% FLUSH: 3.0000 mL | INTRAVENOUS | Status: DC | PRN

## 2017-09-23 MED ORDER — LACTATED RINGERS IV SOLN: INTRAVENOUS | Status: DC

## 2017-09-23 MED ORDER — DOCUSATE SODIUM 100 MG PO CAPS
100.0000 mg | ORAL_CAPSULE | Freq: Two times a day (BID) | ORAL | Status: DC
  Administered 2017-09-23: 100 mg via ORAL
  Filled 2017-09-23: qty 1

## 2017-09-23 MED ORDER — BUPIVACAINE HCL (PF) 0.5 % IJ SOLN
INTRAMUSCULAR | Status: DC | PRN
  Administered 2017-09-23: 26.5 mL
  Administered 2017-09-23: 3.5 mL

## 2017-09-23 MED ORDER — PROPOFOL 10 MG/ML IV BOLUS
INTRAVENOUS | Status: DC | PRN
  Administered 2017-09-23: 150 mg via INTRAVENOUS

## 2017-09-23 MED ORDER — PREDNISONE 20 MG PO TABS: 20.0000 mg | ORAL_TABLET | Freq: Every day | ORAL | Status: DC

## 2017-09-23 MED ORDER — MEPERIDINE HCL 50 MG/ML IJ SOLN: 6.2500 mg | INTRAMUSCULAR | Status: DC | PRN

## 2017-09-23 MED ORDER — POLYETHYLENE GLYCOL 3350 17 G PO PACK: 17.0000 g | PACK | Freq: Every day | ORAL | Status: DC | PRN

## 2017-09-23 MED ORDER — METOCLOPRAMIDE HCL 5 MG/ML IJ SOLN: 10.0000 mg | Freq: Once | INTRAMUSCULAR | Status: DC | PRN

## 2017-09-23 MED ORDER — INSULIN ASPART 100 UNIT/ML ~~LOC~~ SOLN
15.0000 [IU] | Freq: Once | SUBCUTANEOUS | Status: AC
  Administered 2017-09-23: 15 [IU] via SUBCUTANEOUS

## 2017-09-23 MED ORDER — PHENOL 1.4 % MT LIQD: 1.0000 | OROMUCOSAL | Status: DC | PRN

## 2017-09-23 MED ORDER — SUGAMMADEX SODIUM 200 MG/2ML IV SOLN
INTRAVENOUS | Status: DC | PRN
  Administered 2017-09-23: 200 mg via INTRAVENOUS

## 2017-09-23 MED ORDER — PHENYLEPHRINE HCL 10 MG/ML IJ SOLN
INTRAMUSCULAR | Status: DC | PRN
  Administered 2017-09-23: 120 ug via INTRAVENOUS

## 2017-09-23 MED ORDER — PHENYLEPHRINE 40 MCG/ML (10ML) SYRINGE FOR IV PUSH (FOR BLOOD PRESSURE SUPPORT)
PREFILLED_SYRINGE | INTRAVENOUS | Status: AC
  Filled 2017-09-23: qty 10

## 2017-09-23 MED ORDER — FLEET ENEMA 7-19 GM/118ML RE ENEM: 1.0000 | ENEMA | Freq: Once | RECTAL | Status: DC | PRN

## 2017-09-23 MED ORDER — ONDANSETRON HCL 4 MG/2ML IJ SOLN
INTRAMUSCULAR | Status: AC
  Filled 2017-09-23: qty 2

## 2017-09-23 MED ORDER — 0.9 % SODIUM CHLORIDE (POUR BTL) OPTIME
TOPICAL | Status: DC | PRN
  Administered 2017-09-23: 1000 mL

## 2017-09-23 MED ORDER — LIDOCAINE HCL (CARDIAC) PF 100 MG/5ML IV SOSY
PREFILLED_SYRINGE | INTRAVENOUS | Status: DC | PRN
  Administered 2017-09-23: 80 mg via INTRAVENOUS

## 2017-09-23 MED ORDER — ACETAMINOPHEN 325 MG PO TABS: 650.0000 mg | ORAL_TABLET | ORAL | Status: DC | PRN

## 2017-09-23 MED ORDER — MENTHOL 3 MG MT LOZG: 1.0000 | LOZENGE | OROMUCOSAL | Status: DC | PRN

## 2017-09-23 MED ORDER — DULAGLUTIDE 1.5 MG/0.5ML ~~LOC~~ SOAJ: 1.5000 mg | SUBCUTANEOUS | Status: DC

## 2017-09-23 MED ORDER — MIDAZOLAM HCL 5 MG/5ML IJ SOLN
INTRAMUSCULAR | Status: DC | PRN
  Administered 2017-09-23: 2 mg via INTRAVENOUS

## 2017-09-23 MED ORDER — BISACODYL 10 MG RE SUPP: 10.0000 mg | Freq: Every day | RECTAL | Status: DC | PRN

## 2017-09-23 MED ORDER — LIDOCAINE-EPINEPHRINE 1 %-1:100000 IJ SOLN
INTRAMUSCULAR | Status: DC | PRN
  Administered 2017-09-23: 3.5 mL

## 2017-09-23 MED ORDER — ACETAMINOPHEN 650 MG RE SUPP: 650.0000 mg | RECTAL | Status: DC | PRN

## 2017-09-23 MED ORDER — DEXAMETHASONE SODIUM PHOSPHATE 10 MG/ML IJ SOLN
INTRAMUSCULAR | Status: AC
  Filled 2017-09-23: qty 1

## 2017-09-23 MED ORDER — SODIUM CHLORIDE 0.9 % IV SOLN
INTRAVENOUS | Status: DC | PRN
  Administered 2017-09-23: 30 ug/min via INTRAVENOUS

## 2017-09-23 MED ORDER — FENTANYL CITRATE (PF) 100 MCG/2ML IJ SOLN
INTRAMUSCULAR | Status: DC | PRN
  Administered 2017-09-23 (×2): 50 ug via INTRAVENOUS
  Administered 2017-09-23: 25 ug via INTRAVENOUS
  Administered 2017-09-23: 100 ug via INTRAVENOUS
  Administered 2017-09-23: 25 ug via INTRAVENOUS

## 2017-09-23 SURGICAL SUPPLY — 50 items
ADH SKN CLS APL DERMABOND .7 (GAUZE/BANDAGES/DRESSINGS) ×1
BAG DECANTER FOR FLEXI CONT (MISCELLANEOUS) ×2 IMPLANT
BLADE CLIPPER SURG (BLADE) IMPLANT
BUR ACORN 6.0 (BURR) IMPLANT
BUR MATCHSTICK NEURO 3.0 LAGG (BURR) ×2 IMPLANT
CANISTER SUCT 3000ML PPV (MISCELLANEOUS) ×2 IMPLANT
DECANTER SPIKE VIAL GLASS SM (MISCELLANEOUS) ×3 IMPLANT
DERMABOND ADVANCED (GAUZE/BANDAGES/DRESSINGS) ×1
DERMABOND ADVANCED .7 DNX12 (GAUZE/BANDAGES/DRESSINGS) ×1 IMPLANT
DEVICE DISSECT PLASMABLAD 3.0S (MISCELLANEOUS) ×1 IMPLANT
DRAPE HALF SHEET 40X57 (DRAPES) IMPLANT
DRAPE LAPAROTOMY T 102X78X121 (DRAPES) ×2 IMPLANT
DRAPE MICROSCOPE LEICA (MISCELLANEOUS) ×1 IMPLANT
DRSG OPSITE POSTOP 3X4 (GAUZE/BANDAGES/DRESSINGS) ×1 IMPLANT
DURAPREP 26ML APPLICATOR (WOUND CARE) ×2 IMPLANT
ELECT REM PT RETURN 9FT ADLT (ELECTROSURGICAL) ×2
ELECTRODE REM PT RTRN 9FT ADLT (ELECTROSURGICAL) ×1 IMPLANT
GAUZE SPONGE 4X4 12PLY STRL (GAUZE/BANDAGES/DRESSINGS) ×1 IMPLANT
GAUZE SPONGE 4X4 16PLY XRAY LF (GAUZE/BANDAGES/DRESSINGS) IMPLANT
GLOVE BIOGEL PI IND STRL 7.5 (GLOVE) IMPLANT
GLOVE BIOGEL PI IND STRL 8.5 (GLOVE) ×1 IMPLANT
GLOVE BIOGEL PI INDICATOR 7.5 (GLOVE) ×3
GLOVE BIOGEL PI INDICATOR 8.5 (GLOVE) ×1
GLOVE ECLIPSE 7.0 STRL STRAW (GLOVE) ×1 IMPLANT
GLOVE ECLIPSE 8.5 STRL (GLOVE) ×2 IMPLANT
GLOVE SURG SS PI 7.0 STRL IVOR (GLOVE) ×4 IMPLANT
GOWN STRL REUS W/ TWL LRG LVL3 (GOWN DISPOSABLE) IMPLANT
GOWN STRL REUS W/ TWL XL LVL3 (GOWN DISPOSABLE) IMPLANT
GOWN STRL REUS W/TWL 2XL LVL3 (GOWN DISPOSABLE) ×2 IMPLANT
GOWN STRL REUS W/TWL LRG LVL3 (GOWN DISPOSABLE) ×6
GOWN STRL REUS W/TWL XL LVL3 (GOWN DISPOSABLE)
KIT BASIN OR (CUSTOM PROCEDURE TRAY) ×2 IMPLANT
KIT TURNOVER KIT B (KITS) ×2 IMPLANT
NDL SPNL 20GX3.5 QUINCKE YW (NEEDLE) IMPLANT
NEEDLE HYPO 22GX1.5 SAFETY (NEEDLE) ×2 IMPLANT
NEEDLE SPNL 20GX3.5 QUINCKE YW (NEEDLE) ×2 IMPLANT
NS IRRIG 1000ML POUR BTL (IV SOLUTION) ×2 IMPLANT
PACK LAMINECTOMY NEURO (CUSTOM PROCEDURE TRAY) ×2 IMPLANT
PAD ARMBOARD 7.5X6 YLW CONV (MISCELLANEOUS) ×8 IMPLANT
PATTIES SURGICAL .5 X1 (DISPOSABLE) ×2 IMPLANT
PLASMABLADE 3.0S (MISCELLANEOUS) ×2
RUBBERBAND STERILE (MISCELLANEOUS) ×2 IMPLANT
SPONGE SURGIFOAM ABS GEL SZ50 (HEMOSTASIS) ×1 IMPLANT
SUT VIC AB 1 CT1 18XBRD ANBCTR (SUTURE) ×1 IMPLANT
SUT VIC AB 1 CT1 8-18 (SUTURE) ×2
SUT VIC AB 2-0 CP2 18 (SUTURE) ×2 IMPLANT
SUT VIC AB 3-0 SH 8-18 (SUTURE) ×2 IMPLANT
TOWEL GREEN STERILE (TOWEL DISPOSABLE) ×2 IMPLANT
TOWEL GREEN STERILE FF (TOWEL DISPOSABLE) ×2 IMPLANT
WATER STERILE IRR 1000ML POUR (IV SOLUTION) ×2 IMPLANT

## 2017-09-23 NOTE — Op Note (Signed)
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 Surgeon: Kristeen Miss First assistant: Consuella Lose MD Anesthesia: General endotracheal Indications: Francis Cross is a 62 year old individual whose had significant back and left lower extremity pain on 9 July he underwent a microdiscectomy for decompression.  He felt well for about 5 days.  Of time and then developed severe recurrent pain in the buttock and left lower extremity.  His MRI demonstrated what appeared to be an epidural hematoma.  Despite some steroid medications he had worsening pain and weakness in the left lower extremity.  He was advised regarding return to the operating room.  Procedure: The patient was brought to the operating room supine on the stretcher.  After the smooth induction of general endotracheal anesthesia he was turned prone.  The back was prepped with alcohol DuraPrep and draped in a sterile fashion.  The previously made incision was reopened and dissection was carried down bluntly through the soft tissues to expose the interlaminar space at L4-L5.  This was opened on the left side and a self-retaining retractor was placed in the wound.  After removing some of the fibrous tissue that was forming along with some blood clot in the epidural space the common dural tube was identified.  By dissecting laterally there was some hemorrhagic fatty tissue that was encountered and this was removed in a piecemeal fashion.  The path of the L5 nerve root was encountered and this was carefully dissected free and removed from other fibrous material and within this material there was identified several fragments of disc material.  These were removed and left for good decompression of the dura and the L5 nerve root.  The  disc space was entered and substantial quantities of more degenerated disc were encountered this after having had a fairly complete discectomy in his first procedure.  Once this area was decompressed the path of the common dural tube was followed below the L5 nerve root.  On the MRI there was a suggestion of some epidural blood in the epidural space extending down to L5-S1.  This was found to be just that and no fragments of disc were found in this tissue.  The epidural fat was resected to allow for good decompression and exploration of the common dural tube down to the L5-S1 disc space.  Once this was cleared and decompress the area was checked for hemostasis.  The soft tissues were infiltrated with half percent Marcaine for a total of 20 cc.  Then the lumbodorsal fascia was closed with #1 Vicryl 2-0 Vicryl was used in subcutaneous tissues and 3-0 Vicryl subcuticularly.  Blood loss for the procedure was estimated at less than 75 cc

## 2017-09-23 NOTE — Anesthesia Procedure Notes (Signed)
Procedure Name: Intubation Date/Time: 09/23/2017 1:12 PM Performed by: Marsa Aris, CRNA Pre-anesthesia Checklist: Patient identified, Suction available, Emergency Drugs available, Patient being monitored and Timeout performed Patient Re-evaluated:Patient Re-evaluated prior to induction Oxygen Delivery Method: Circle system utilized Preoxygenation: Pre-oxygenation with 100% oxygen Induction Type: IV induction Ventilation: Mask ventilation without difficulty Laryngoscope Size: Glidescope and 3 Grade View: Grade I Tube type: Oral Tube size: 7.5 mm Number of attempts: 1 Airway Equipment and Method: Stylet and Video-laryngoscopy Placement Confirmation: ETT inserted through vocal cords under direct vision,  positive ETCO2,  CO2 detector and breath sounds checked- equal and bilateral Secured at: 22 cm Tube secured with: Tape Dental Injury: Teeth and Oropharynx as per pre-operative assessment  Difficulty Due To: Difficulty was anticipated and Difficult Airway- due to anterior larynx Future Recommendations: Recommend- induction with short-acting agent, and alternative techniques readily available Comments: Glidescope used per recommendation from prior surgeries. Dentition unchanged, c-spine neutrality maintained.

## 2017-09-23 NOTE — Progress Notes (Addendum)
Inpatient Diabetes Program Recommendations  AACE/ADA: New Consensus Statement on Inpatient Glycemic Control (2015)  Target Ranges:  Prepandial:   less than 140 mg/dL      Peak postprandial:   less than 180 mg/dL (1-2 hours)      Critically ill patients:  140 - 180 mg/dL   Lab Results  Component Value Date   GLUCAP 268 (H) 09/23/2017   HGBA1C 9.2 (H) 09/23/2017    Review of Glycemic ControlResults for SADAT, SLIWA (MRN [id number]) as of 09/23/2017 16:06  Ref. Range 09/23/2017 10:46 09/23/2017 15:23  Glucose-Capillary Latest Ref Range: 70 - 99 mg/dL 257 (H) 268 (H)   Diabetes history: Type 2 DM  Outpatient Diabetes medications: Trulicity 1.5 mg weekly Current orders for Inpatient glycemic control:  Novolog resistant tid with meals Inpatient Diabetes Program Recommendations:   While in the hospital, consider adding Levemir 18 units q HS to keep blood sugars less than 180 mg/dL.  Will follow-up on 09/24/17.   Thanks,  Adah Perl, RN, BC-ADM Inpatient Diabetes Coordinator Pager [phone] (8a-5p)

## 2017-09-23 NOTE — Progress Notes (Signed)
Patient ID: Francis Cross, male   DOB: 12-02-1955, 62 y.o.   MRN: [id number] Vital signs are stable Leg feels better in the immediate postoperative. We will see how he does with mobilization Blood sugars are high Sliding scale has been written Hopeful for discharge in morning

## 2017-09-23 NOTE — Discharge Summary (Signed)
Physician Discharge Summary  Patient ID: Francis Cross MRN: [id number] DOB/AGE: 1955/05/06 62 y.o.  Admit date: 09/23/2017 Discharge date: 09/23/2017  Admission Diagnoses: Recurrent herniated nucleus pulposus L4-L5 left, lumbar radiculopathy  Discharge Diagnoses: Recurrent herniated nucleus pulposus L4-L5 left, lumbar radiculopathy Active Problems:   Herniated nucleus pulposus, L4-5 left   Discharged Condition: good  Hospital Course: Patient was admitted to undergo surgery for recurrent herniated nucleus pulposus.  He tolerated it well.  Consults: None  Significant Diagnostic Studies: None  Treatments: surgery: Repeat microdiscectomy L4-L5 left  Discharge Exam: Blood pressure 113/71, pulse 100, temperature 98.4 F (36.9 C), resp. rate 18, height 5\' 5"  (1.651 m), weight 93.4 kg (206 lb), SpO2 97 %. Incision/Wound: Incision is clean and dry.  Station and gait is intact.  Disposition: Discharge disposition: 01-Home or Self Care       Discharge Instructions    Call MD for:  redness, tenderness, or signs of infection (pain, swelling, redness, odor or green/yellow discharge around incision site)   Complete by:  As directed    Call MD for:  severe uncontrolled pain   Complete by:  As directed    Call MD for:  temperature >100.4   Complete by:  As directed    Diet - low sodium heart healthy   Complete by:  As directed    Discharge instructions   Complete by:  As directed    Okay to shower. Do not apply salves or appointments to incision. No heavy lifting with the upper extremities greater than 15 pounds. May resume driving when not requiring pain medication and patient feels comfortable with doing so.   Incentive spirometry RT   Complete by:  As directed    Increase activity slowly   Complete by:  As directed      Allergies as of 09/23/2017      Reactions   Cephalexin Rash   Penicillins Itching, Rash, Other (See Comments)   Has patient had a PCN reaction causing  immediate rash, facial/tongue/throat swelling, SOB or lightheadedness with hypotension: No Has patient had a PCN reaction causing severe rash involving mucus membranes or skin necrosis: No Has patient had a PCN reaction that required hospitalization: No Has patient had a PCN reaction occurring within the last 10 years: No If all of the above answers are "NO", then may proceed with Cephalosporin use.      Medication List    TAKE these medications   cyclobenzaprine 10 MG tablet Commonly known as:  FLEXERIL Take 10 mg by mouth every 8 (eight) hours as needed for muscle spasms.   Dulaglutide 1.5 MG/0.5ML Sopn Commonly known as:  TRULICITY Inject 1.5 mg into the skin once a week. What changed:  when to take this   ibuprofen 200 MG tablet Commonly known as:  ADVIL,MOTRIN Take 400 mg by mouth every 6 (six) hours as needed for headache or moderate pain.   ketoconazole 2 % shampoo Commonly known as:  NIZORAL APPLY TOPICALLY 2 (TWO) TIMES A WEEK.   ketorolac 0.5 % ophthalmic solution Commonly known as:  ACULAR Place 1 drop into both eyes 2 (two) times daily as needed.   lisinopril-hydrochlorothiazide 20-12.5 MG tablet Commonly known as:  PRINZIDE,ZESTORETIC Take 1 tablet by mouth daily.   meloxicam 7.5 MG tablet Commonly known as:  MOBIC Take 1 tablet (7.5 mg total) by mouth daily.   metoprolol tartrate 50 MG tablet Commonly known as:  LOPRESSOR Take 1 tablet (50 mg) 1 hour prior to your CT scan  nystatin cream Commonly known as:  MYCOSTATIN Apply 1 application topically 3 (three) times daily.   omeprazole 20 MG capsule Commonly known as:  PRILOSEC Take 1 capsule (20 mg total) by mouth daily.   oxyCODONE-acetaminophen 5-325 MG tablet Commonly known as:  PERCOCET/ROXICET Take 1-2 tablets by mouth every 4 (four) hours as needed for severe pain. What changed:  Another medication with the same name was added. Make sure you understand how and when to take each.    oxyCODONE-acetaminophen 7.5-325 MG tablet Commonly known as:  PERCOCET Take 1 tablet by mouth every 4 (four) hours as needed for severe pain. What changed:  You were already taking a medication with the same name, and this prescription was added. Make sure you understand how and when to take each.   predniSONE 10 MG (48) Tbpk tablet Commonly known as:  STERAPRED UNI-PAK 48 TAB Take 20-80 mg by mouth See admin instructions. Take 80 mg by mouth on day 1, take 60 mg by mouth on day 2 and 3, take 50 mg by mouth on day 4-6, take 40 mg by mouth on days 7-8, take 20 mg by mouth on days 9-12   [place] 420 MG/3.5ML Soct Generic drug:  Evolocumab with Infusor INJECT 1 CARTRIDGE (420 MG) INTO THE SKIN EVERY 30 DAYS   UNABLE TO FIND TESTOSTERONE LIPODERM 5% (50 MG/ML) CREAM. Apply 2 ml (8 clicks) topically once a day as directed.        SignedEarleen Newport 09/23/2017, 5:22 PM

## 2017-09-23 NOTE — Transfer of Care (Signed)
Immediate Anesthesia Transfer of Care Note  Patient: Francis Cross  Procedure(s) Performed: Lumbar Four-Five Redo Discectomy for epidural hematoma. (N/A Spine Lumbar)  Patient Location: PACU  Anesthesia Type:General  Level of Consciousness: awake, alert  and oriented  Airway & Oxygen Therapy: Patient Spontanous Breathing and Patient connected to nasal cannula oxygen  Post-op Assessment: Report given to RN and Post -op Vital signs reviewed and stable  Post vital signs: Reviewed and stable  Last Vitals:  Vitals Value Taken Time  BP 130/78 09/23/2017  3:06 PM  Temp    Pulse 103 09/23/2017  3:07 PM  Resp 26 09/23/2017  3:07 PM  SpO2 97 % 09/23/2017  3:07 PM  Vitals shown include unvalidated device data.  Last Pain:  Vitals:   09/23/17 1036  TempSrc: Oral         Complications: No apparent anesthesia complications

## 2017-09-23 NOTE — Anesthesia Preprocedure Evaluation (Signed)
Anesthesia Evaluation  Patient identified by MRN, date of birth, ID band Patient awake    Reviewed: Allergy & Precautions, NPO status , Patient's Chart, lab work & pertinent test results  Airway Mallampati: III  TM Distance: >3 FB Neck ROM: Full    Dental no notable dental hx.    Pulmonary neg pulmonary ROS, former smoker,    Pulmonary exam normal breath sounds clear to auscultation       Cardiovascular hypertension, Pt. on medications Normal cardiovascular exam Rhythm:Regular Rate:Normal     Neuro/Psych negative neurological ROS  negative psych ROS   GI/Hepatic Neg liver ROS, hiatal hernia, GERD  Controlled,  Endo/Other  diabetes, Type 2  Renal/GU negative Renal ROS  negative genitourinary   Musculoskeletal negative musculoskeletal ROS (+)   Abdominal   Peds negative pediatric ROS (+)  Hematology negative hematology ROS (+)   Anesthesia Other Findings Previous glidescope intubation  Reproductive/Obstetrics negative OB ROS                             Anesthesia Physical Anesthesia Plan  ASA: II  Anesthesia Plan: General   Post-op Pain Management:    Induction: Intravenous  PONV Risk Score and Plan: 2 and Ondansetron, Dexamethasone and Treatment may vary due to age or medical condition  Airway Management Planned: Video Laryngoscope Planned  Additional Equipment:   Intra-op Plan:   Post-operative Plan: Extubation in OR  Informed Consent: I have reviewed the patients History and Physical, chart, labs and discussed the procedure including the risks, benefits and alternatives for the proposed anesthesia with the patient or authorized representative who has indicated his/her understanding and acceptance.   Dental advisory given  Plan Discussed with: CRNA  Anesthesia Plan Comments:         Anesthesia Quick Evaluation

## 2017-09-23 NOTE — H&P (Addendum)
Francis Cross is an 62 y.o. male.   Chief Complaint: Back and left lower extremity pain status post microdiscectomy on 09/09/2017  HPI: Francis Cross is a 62 year old individual who I did a microdiscectomy on at L4-5 on the left side on July 9.  Since that time he has had about 4 days of good relief and subsequently developed worsening pain in the buttock and left lower extremity and MRI was performed last week on the 19th and this revealed the presence of some epidural hematoma behind the vertebral body of L5 causing some mass-effect on the left side and nerve roots despite bedrest steroids and passage of time he notes the pain is worsening he is having difficulty with bladder control also is been advised regarding reexploration surgical decompression at L4-5 and L5-S1 on the left.  Past Medical History:  Diagnosis Date  . Acid reflux   . Cancer ([place])    skin cancer- basal cell on nose  . Diabetes mellitus without complication ([place])   . Difficult intubation   . Erectile dysfunction   . Fatty liver   . Gout   . Hemochromatosis   . Hiatal hernia   . History of kidney stones   . Hyperlipidemia   . Hypertension   . Iron overload   . Obesity     Past Surgical History:  Procedure Laterality Date  . APPENDECTOMY    . BACK SURGERY  09/09/2017   lumbar discectomy   . CARDIAC CATHETERIZATION     [place]    . KIDNEY STONE SURGERY  03/07/2017   laser blast  . TONSILLECTOMY      Family History  Problem Relation Age of Onset  . Cancer Father        lung  . Cancer Mother        melanoma  . Hyperlipidemia Sister   . Hypertension Sister   . Colon cancer Neg Hx   . Stomach cancer Neg Hx   . Rectal cancer Neg Hx   . Esophageal cancer Neg Hx   . Liver cancer Neg Hx    Social History:  reports that he has quit smoking. He has quit using smokeless tobacco. He reports that he drank alcohol. He reports that he does not use drugs.  Allergies:  Allergies  Allergen  Reactions  . Cephalexin Rash  . Penicillins Itching, Rash and Other (See Comments)    Has patient had a PCN reaction causing immediate rash, facial/tongue/throat swelling, SOB or lightheadedness with hypotension: No Has patient had a PCN reaction causing severe rash involving mucus membranes or skin necrosis: No Has patient had a PCN reaction that required hospitalization: No Has patient had a PCN reaction occurring within the last 10 years: No If all of the above answers are "NO", then may proceed with Cephalosporin use.     Facility-Administered Medications Prior to Admission  Medication Dose Route Frequency Provider Last Rate Last Dose  . 0.9 %  sodium chloride infusion  500 mL Intravenous Continuous Nelida Meuse III, MD       Medications Prior to Admission  Medication Sig Dispense Refill  . cyclobenzaprine (FLEXERIL) 10 MG tablet Take 10 mg by mouth every 8 (eight) hours as needed for muscle spasms.  0  . Dulaglutide (TRULICITY) 1.5 FI/4.3PI SOPN Inject 1.5 mg into the skin once a week. (Patient taking differently: Inject 1.5 mg into the skin every Friday. ) 6 mL 1  . ibuprofen (ADVIL,MOTRIN) 200 MG tablet  Take 400 mg by mouth every 6 (six) hours as needed for headache or moderate pain.    Marland Kitchen lisinopril-hydrochlorothiazide (PRINZIDE,ZESTORETIC) 20-12.5 MG tablet Take 1 tablet by mouth daily. 90 tablet 1  . oxyCODONE-acetaminophen (PERCOCET/ROXICET) 5-325 MG tablet Take 1-2 tablets by mouth every 4 (four) hours as needed for severe pain.  0  . predniSONE (STERAPRED UNI-PAK 48 TAB) 10 MG (48) TBPK tablet Take 20-80 mg by mouth See admin instructions. Take 80 mg by mouth on day 1, take 60 mg by mouth on day 2 and 3, take 50 mg by mouth on day 4-6, take 40 mg by mouth on days 7-8, take 20 mg by mouth on days 9-12  0  . ketoconazole (NIZORAL) 2 % shampoo APPLY TOPICALLY 2 (TWO) TIMES A WEEK. (Patient not taking: Reported on 09/22/2017) 120 mL 1  . ketorolac (ACULAR) 0.5 % ophthalmic solution  Place 1 drop into both eyes 2 (two) times daily as needed. (Patient not taking: Reported on 09/22/2017) 5 mL 1  . meloxicam (MOBIC) 7.5 MG tablet Take 1 tablet (7.5 mg total) by mouth daily. (Patient not taking: Reported on 09/22/2017) 30 tablet 1  . metoprolol tartrate (LOPRESSOR) 50 MG tablet Take 1 tablet (50 mg) 1 hour prior to your CT scan (Patient not taking: Reported on 09/22/2017) 1 tablet 0  . nystatin cream (MYCOSTATIN) Apply 1 application topically 3 (three) times daily. (Patient not taking: Reported on 09/22/2017) 30 g 3  . omeprazole (PRILOSEC) 20 MG capsule Take 1 capsule (20 mg total) by mouth daily. (Patient not taking: Reported on 09/22/2017) 20 capsule 0  . [place] 420 MG/3.5ML SOCT INJECT 1 CARTRIDGE (420 MG) INTO THE SKIN EVERY 30 DAYS 3 Cartridge 3  . UNABLE TO FIND TESTOSTERONE LIPODERM 5% (50 MG/ML) CREAM. Apply 2 ml (8 clicks) topically once a day as directed. (Patient not taking: Reported on 09/22/2017) 60 mL 4    Results for orders placed or performed during the hospital encounter of 09/23/17 (from the past 48 hour(s))  Basic metabolic panel     Status: Abnormal   Collection Time: 09/23/17 10:28 AM  Result Value Ref Range   Sodium 134 (L) 135 - 145 mmol/L   Potassium 3.4 (L) 3.5 - 5.1 mmol/L   Chloride 93 (L) 98 - 111 mmol/L   CO2 29 22 - 32 mmol/L   Glucose, Bld 246 (H) 70 - 99 mg/dL   BUN 17 8 - 23 mg/dL   Creatinine, Ser 0.88 0.61 - 1.24 mg/dL   Calcium 9.2 8.9 - 10.3 mg/dL   GFR calc non Af Amer >60 >60 mL/min   GFR calc Af Amer >60 >60 mL/min    Comment: (NOTE) The eGFR has been calculated using the CKD EPI equation. This calculation has not been validated in all clinical situations. eGFR's persistently <60 mL/min signify possible Chronic Kidney Disease.    Anion gap 12 5 - 15    Comment: Performed at [facility] [address] [place], [place] [address]  Hemoglobin A1c     Status: Abnormal   Collection Time: 09/23/17 10:28 AM   Result Value Ref Range   Hgb A1c MFr Bld 9.2 (H) 4.8 - 5.6 %    Comment: (NOTE) Pre diabetes:          5.7%-6.4% Diabetes:              >6.4% Glycemic control for   <7.0% adults with diabetes    Mean Plasma Glucose  217.34 mg/dL    Comment: Performed at [facility], [place] [address], [place], [place] [address]  CBC     Status: Abnormal   Collection Time: 09/23/17 10:28 AM  Result Value Ref Range   WBC 15.7 (H) 4.0 - 10.5 K/uL   RBC 5.35 4.22 - 5.81 MIL/uL   Hemoglobin 16.0 13.0 - 17.0 g/dL   HCT 47.0 39.0 - 52.0 %   MCV 87.9 78.0 - 100.0 fL   MCH 29.9 26.0 - 34.0 pg   MCHC 34.0 30.0 - 36.0 g/dL   RDW 11.5 11.5 - 15.5 %   Platelets 172 150 - 400 K/uL    Comment: Performed at [facility], [place] [address] [place], [place] [address]  Glucose, capillary     Status: Abnormal   Collection Time: 09/23/17 10:46 AM  Result Value Ref Range   Glucose-Capillary 257 (H) 70 - 99 mg/dL   No results found.  Review of Systems  Constitutional: Negative.   HENT: Negative.   Eyes: Negative.   Respiratory: Negative.   Cardiovascular: Negative.   Gastrointestinal: Negative.   Genitourinary:       Difficulty controlling urinary stream with constant urgency.  Musculoskeletal: Positive for back pain.       Left leg pain  Skin: Negative.   Neurological:       Weakness in the buttock and left lower extremity back pain  Endo/Heme/Allergies: Negative.   Psychiatric/Behavioral: Negative.     Blood pressure 119/86, pulse 90, temperature 98.1 F (36.7 C), temperature source Oral, resp. rate 20, height _0  (1.651 m), weight 93.4 kg (206 lb), SpO2 98 %. Physical Exam  Constitutional: He is oriented to person, place, and time. He appears well-developed and well-nourished.  HENT:  Head: Normocephalic and atraumatic.  Eyes: Pupils are equal, round, and reactive to light. Conjunctivae and EOM are normal.  Neck: Normal range of motion. Neck supple.  Cardiovascular: Normal rate and  regular rhythm.  Respiratory: Effort normal and breath sounds normal.  GI: Soft. Bowel sounds are normal.  Musculoskeletal:  Positive straight leg raising bilaterally at 15 degrees Patrick's maneuver difficult to perform secondary to pain and back.  Neurological: He is alert and oriented to person, place, and time.  Absent deep tendon reflexes on the left in the patella and the Achilles both  Skin: Skin is warm and dry.  Psychiatric: He has a normal mood and affect. His behavior is normal. Judgment and thought content normal.     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  Plan: Return to the operating room for reexploration decompression of left L4-5 and L5-S1  Earleen Newport, MD 09/23/2017, 12:17 PM

## 2017-09-24 ENCOUNTER — Other Ambulatory Visit: Payer: Self-pay | Admitting: Family Medicine

## 2017-09-24 ENCOUNTER — Encounter: Payer: Self-pay | Admitting: Family Medicine

## 2017-09-24 ENCOUNTER — Encounter (HOSPITAL_COMMUNITY): Payer: Self-pay | Admitting: Neurological Surgery

## 2017-09-24 DIAGNOSIS — M5116 Intervertebral disc disorders with radiculopathy, lumbar region: Secondary | ICD-10-CM | POA: Diagnosis not present

## 2017-09-24 LAB — GLUCOSE, CAPILLARY
Glucose-Capillary: 363 mg/dL — ABNORMAL HIGH (ref 70–99)
Glucose-Capillary: 384 mg/dL — ABNORMAL HIGH (ref 70–99)

## 2017-09-24 MED ORDER — SULFAMETHOXAZOLE-TRIMETHOPRIM 800-160 MG PO TABS: 1.0000 | ORAL_TABLET | Freq: Two times a day (BID) | ORAL | 0 refills | Status: DC

## 2017-09-24 MED ORDER — INSULIN ASPART 100 UNIT/ML ~~LOC~~ SOLN: SUBCUTANEOUS | 0 refills | Status: DC

## 2017-09-24 MED ORDER — INSULIN ASPART 100 UNIT/ML ~~LOC~~ SOLN
15.0000 [IU] | Freq: Once | SUBCUTANEOUS | Status: AC
Start: 2017-09-24 — End: 2017-09-24
  Administered 2017-09-24: 15 [IU] via SUBCUTANEOUS

## 2017-09-24 MED FILL — Thrombin (Recombinant) For Soln 5000 Unit: CUTANEOUS | Qty: 5000 | Status: AC

## 2017-09-24 NOTE — Progress Notes (Signed)
Neurosurgery Progress Note  No issues overnight.  Pre op pain much improved No concerns this am  EXAM:  BP 114/69 (BP Location: Right Arm)   Pulse 91   Temp 98.7 F (37.1 C) (Oral)   Resp 16   Ht 5\' 5"  (1.651 m)   Wt 93.4 kg (206 lb)   SpO2 96%   BMI 34.28 kg/m   Awake, alert, oriented  Speech fluent, appropriate  CN grossly intact  MAEW with good strength No drop foot  PLAN Looks good this am and much improved pain Cleared for d/c home UA yesterday suggestive of UTI. Sent Bactrim. F/U PCP

## 2017-09-24 NOTE — Progress Notes (Signed)
Inpatient Diabetes Program Recommendations  AACE/ADA: New Consensus Statement on Inpatient Glycemic Control (2015)  Target Ranges:  Prepandial:   less than 140 mg/dL      Peak postprandial:   less than 180 mg/dL (1-2 hours)      Critically ill patients:  140 - 180 mg/dL   Lab Results  Component Value Date   GLUCAP 363 (H) 09/24/2017   HGBA1C 9.2 (H) 09/23/2017    Review of Glycemic ControlResults for Cross, Francis (MRN [id number]) as of 09/24/2017 10:13  Ref. Range 09/23/2017 16:48 09/23/2017 21:08 09/23/2017 22:33 09/24/2017 00:29 09/24/2017 06:47  Glucose-Capillary Latest Ref Range: 70 - 99 mg/dL 292 (H) 425 (H) 454 (H) 384 (H) 363 (H)    Diabetes history: Type 2 DM  Outpatient Diabetes medications: Trulicity 1.2 mg once a week Current orders for Inpatient glycemic control:  Novolog resistant tid with meals and HS Prednisone 40 mg q AM  Inpatient Diabetes Program Recommendations:    Note patient d/c'd this morning.  Talked to patient and family regarding A1C.  He admits that blood sugars have been worse since being on steroids and with back pain. Daughter has already messaged PCP regarding potential need for insulin.  I told her that I had recommended basal insulin as well.  Briefly showed patient insulin pen, including how to put on needle, 2 unit prime, dialing dose, and injecting (hold in place for 6-10 seconds).  Encouraged him to check blood sugars 3-4 times a day.  We reviewed hypoglycemia symptoms and how to treat.  Family and patient very engaged.  We discussed importance of glycemic control for healing.    Thanks,  Adah Perl, RN, BC-ADM Inpatient Diabetes Coordinator Pager [phone] (8a-5p)

## 2017-09-24 NOTE — Discharge Instructions (Signed)
Wound Care Leave incision open to air. You may shower. Do not scrub directly on incision.  Do not put any creams, lotions, or ointments on incision. Activity Walk each and every day, increasing distance each day. No lifting greater than 5 lbs.  Avoid excessive neck motion. No driving for 2 weeks; may ride as a passenger locally.  Diet Resume your normal diet.  Return to Work Will be discussed at you follow up appointment. Call Your Doctor If Any of These Occur Redness, drainage, or swelling at the wound.  Temperature greater than 101 degrees. Severe pain not relieved by pain medication. Increased difficulty swallowing. Incision starts to come apart. Follow Up Appt Call today for appointment in 3 weeks ([phone]) or for problems.  If you have any hardware placed in your spine, you will need an x-ray before your appointment.

## 2017-09-24 NOTE — Anesthesia Postprocedure Evaluation (Signed)
Anesthesia Post Note  Patient: Francis Cross  Procedure(s) Performed: Lumbar Four-Five Redo Discectomy for epidural hematoma. (N/A Spine Lumbar)     Patient location during evaluation: PACU Anesthesia Type: General Level of consciousness: awake and alert Pain management: pain level controlled Vital Signs Assessment: post-procedure vital signs reviewed and stable Respiratory status: spontaneous breathing, nonlabored ventilation, respiratory function stable and patient connected to nasal cannula oxygen Cardiovascular status: blood pressure returned to baseline and stable Postop Assessment: no apparent nausea or vomiting Anesthetic complications: no    Last Vitals:  Vitals:   09/24/17 0447 09/24/17 0721  BP: 120/76 114/69  Pulse: 76 91  Resp: 18 16  Temp: 36.6 C 37.1 C  SpO2: 96% 96%    Last Pain:  Vitals:   09/24/17 0824  TempSrc:   PainSc: 4                  Montez Hageman

## 2017-09-24 NOTE — Evaluation (Signed)
Physical Therapy Evaluation Patient Details Name: Francis Cross MRN: [id number] DOB: 04/21/1955 Today's Date: 09/24/2017   History of Present Illness  Pt is a 62 y.o. male s/p repeat laminotomy and foraminotomies L4-5 with operating microscope microdissection technique after increase in back and LLE pain following previous microdiscectomy 7/9. He has a PMH significant for acid reflux, diabetes mellitus wihtout complication, fatty liver, gout, hemochromatosis, hiatal hernia, hyperlipidemia, hypertension, and obesity.  Clinical Impression  Patient evaluated by Physical Therapy with no further acute PT needs identified. Prior to initial surgery, patient was very active, independent with mobility and ADL's. However, after initial surgery, he experienced debility and was using walker/wheelchair for mobility and having difficulty with ADL's. Currently requiring supervision for ambulating hallway distances and for stair negotiation to prepare for discharge home. Instructed on mobility progression and activity recommendations; pt family verbalized understanding. All education has been completed and the patient has no further questions. See below for any follow-up Physical Therapy or equipment needs. PT is signing off. Thank you for this referral.     Follow Up Recommendations No PT follow up    Equipment Recommendations  Rolling walker with 5" wheels;3in1 (PT)((patient family requesting walker))    Recommendations for Other Services       Precautions / Restrictions Precautions Precautions: Back Precaution Comments: Verbally reviewed back precautions. Required Braces or Orthoses: ("no brace needed" per orders) Restrictions Weight Bearing Restrictions: No      Mobility  Bed Mobility Overal bed mobility: Modified Independent Bed Mobility: Rolling;Sidelying to Sit;Sit to Sidelying Rolling: Modified independent (Device/Increase time) Sidelying to sit: Modified independent (Device/Increase time)     Sit to sidelying: Supervision General bed mobility comments: good log roll technique  Transfers Overall transfer level: Needs assistance Equipment used: None Transfers: Sit to/from Stand Sit to Stand: Supervision         General transfer comment: Guarding assist for stability and safety.   Ambulation/Gait Ambulation/Gait assistance: Supervision Gait Distance (Feet): 350 Feet Assistive device: None Gait Pattern/deviations: Decreased step length - right;Antalgic     General Gait Details: Patient with slightly antalgic gait and displayed one minor loss of balance laterally but was able to regain equilibrium without additional physical assistance.   Stairs Stairs: Yes Stairs assistance: Supervision Stair Management: One rail Right Number of Stairs: 10 General stair comments: Supervision for safety with step over step pattern  Wheelchair Mobility    Modified Rankin (Stroke Patients Only)       Balance Overall balance assessment: Mild deficits observed, not formally tested                                           Pertinent Vitals/Pain Pain Assessment: Faces Faces Pain Scale: Hurts a little bit Pain Location: LLE and back Pain Descriptors / Indicators: Aching;Discomfort;Operative site guarding;Numbness Pain Intervention(s): Monitored during session    Home Living Family/patient expects to be discharged to:: Private residence Living Arrangements: Spouse/significant other Available Help at Discharge: Family;Available 24 hours/day Type of Home: House Home Access: Stairs to enter Entrance Stairs-Rails: Right Entrance Stairs-Number of Steps: 3 Home Layout: Laundry or work area in [place]: None(has wife's standard walker and borrowed w/c)      Prior Function Level of Independence: Independent         Comments: Was independent prior to initial surgery and enjoyed doing yard work and boating. However, pt with increasing  debility  with increasing pain over the past weeks since initial surgery requiring assistance for all ADL.      Hand Dominance        Extremity/Trunk Assessment   Upper Extremity Assessment Upper Extremity Assessment: Overall WFL for tasks assessed    Lower Extremity Assessment Lower Extremity Assessment: LLE deficits/detail LLE Deficits / Details: Reports numbness in first and second toe and pain.        Communication   Communication: No difficulties  Cognition Arousal/Alertness: Awake/alert Behavior During Therapy: WFL for tasks assessed/performed Overall Cognitive Status: Within Functional Limits for tasks assessed                                        General Comments General comments (skin integrity, edema, etc.): Daughter and wife present throughout session. Instructions on cryotherapy provided.    Exercises     Assessment/Plan    PT Assessment Patent does not need any further PT services  PT Problem List         PT Treatment Interventions      PT Goals (Current goals can be found in the Care Plan section)  Acute Rehab PT Goals Patient Stated Goal: "for that pain not to come back" PT Goal Formulation: All assessment and education complete, DC therapy    Frequency     Barriers to discharge        Co-evaluation               AM-PAC PT "6 Clicks" Daily Activity  Outcome Measure Difficulty turning over in bed (including adjusting bedclothes, sheets and blankets)?: A Little Difficulty moving from lying on back to sitting on the side of the bed? : A Little Difficulty sitting down on and standing up from a chair with arms (e.g., wheelchair, bedside commode, etc,.)?: None Help needed moving to and from a bed to chair (including a wheelchair)?: A Little Help needed walking in hospital room?: A Little Help needed climbing 3-5 steps with a railing? : A Little 6 Click Score: 19    End of Session Equipment Utilized During Treatment:  Gait belt Activity Tolerance: Patient tolerated treatment well Patient left: in bed;with call bell/phone within reach;with family/visitor present Nurse Communication: Mobility status PT Visit Diagnosis: Unsteadiness on feet (R26.81);Pain Pain - part of body: (back)    Time: 3291-9166 PT Time Calculation (min) (ACUTE ONLY): 16 min   Charges:   PT Evaluation $PT Eval Low Complexity: 1 Low     PT G Codes:        Ellamae Sia, PT, DPT Acute Rehabilitation Services  Pager: [phone]   Willy Eddy 09/24/2017, 9:46 AM

## 2017-09-24 NOTE — Progress Notes (Signed)
Patient is discharged from room 3C09 at this time. Alert and in stable condition. IV site d/c'd and instructions read to patient and family with understanding verbalized. Left unit via wheelchair with all belongings at side.

## 2017-09-24 NOTE — Progress Notes (Signed)
Patient's CBG elevated last night at bedtime. CBG 425 at 2100 and CBG- 454 at 2230 even with Novolog insulin coverage. No s/s of hyperglycemia noted. On call PA notified with more coverage received. Rechecked CBG at 0030- 384. Will continue to monitor closely.

## 2017-09-24 NOTE — Evaluation (Addendum)
Occupational Therapy Evaluation and Discharge Patient Details Name: Francis Cross MRN: [id number] DOB: [birth year] Today's Date: 09/24/2017    History of Present Illness Pt is a 62 y.o. male s/p repeat laminotomy and foraminotomies L4-5 with operating microscope microdissection technique after increase in back and LLE pain following previous microdiscectomy 7/9. He has a PMH significant for acid reflux, diabetes mellitus wihtout complication, fatty liver, gout, hemochromatosis, hiatal hernia, hyperlipidemia, hypertension, and obesity.   Clinical Impression   Prior to initial surgery, pt was independent with ADL and functional mobility and maintaining an active lifestyle. He has been experiencing progressive difficulty with ADL and functional mobility due to back and LLE pain impacting his independence prior to surgery yesterday. Pt currently requiring min guard assist for LB ADL, toilet transfers, and walk-in shower transfers. He and his family were educated extensively concerning back precautions related to ADL, safe home set-up, safe use of 3-in-1 over commode and as a shower seat, and mobility progression post-acute D/C. They verbalize and demonstrate understanding. Also demonstrated use of reacher to assist pt in avoiding bending per daughter's request and she is planning to obtain this from gift shop. All OT education complete and no further needs identified. Acute OT will sign off.     Follow Up Recommendations  No OT follow up;Supervision/Assistance - 24 hour    Equipment Recommendations  3 in 1 bedside commode;Other (comment)(RW- 2 wheeled; reacher)    Recommendations for Other Services       Precautions / Restrictions Precautions Precautions: Back Precaution Comments: Verbally reviewed back precautions related to ADL. Pt able to state 2/3 and family reminding him of the third.  Required Braces or Orthoses: ("no brace needed" per orders) Restrictions Weight Bearing Restrictions: No       Mobility Bed Mobility Overal bed mobility: Needs Assistance Bed Mobility: Rolling;Sidelying to Sit;Sit to Sidelying Rolling: Supervision Sidelying to sit: Supervision     Sit to sidelying: Supervision General bed mobility comments: Cues for safe technique. After session, pt attempting to lay back down without log roll technique.   Transfers Overall transfer level: Needs assistance Equipment used: None Transfers: Sit to/from Stand Sit to Stand: Min guard         General transfer comment: Guarding assist for stability and safety.     Balance Overall balance assessment: Mild deficits observed, not formally tested                                         ADL either performed or assessed with clinical judgement   ADL Overall ADL's : Needs assistance/impaired Eating/Feeding: Set up;Sitting   Grooming: Set up;Sitting   Upper Body Bathing: Set up;Sitting   Lower Body Bathing: Min guard;Sit to/from stand   Upper Body Dressing : Set up;Sitting   Lower Body Dressing: Min guard;Sit to/from stand   Toilet Transfer: Min guard;Ambulation Toilet Transfer Details (indicate cue type and reason): slight unsteadiness noted and may benefit from RW for longer distances Toileting- Water quality scientist and Hygiene: Min guard;Sit to/from stand   Tub/ Shower Transfer: Min guard;Ambulation;3 in 1;Walk-in shower   Functional mobility during ADLs: Min guard General ADL Comments: Educated pt and family concerning back precautions related to ADL participation as well as safe strategies for functional mobility and shower transfers.      Vision Patient Visual Report: No change from baseline Vision Assessment?: No apparent visual deficits     Perception  Praxis      Pertinent Vitals/Pain Pain Assessment: Faces Faces Pain Scale: Hurts a little bit Pain Location: LLE and back Pain Descriptors / Indicators: Aching;Discomfort;Operative site  guarding;Numbness Pain Intervention(s): Limited activity within patient's tolerance;Monitored during session;Repositioned     Hand Dominance     Extremity/Trunk Assessment Upper Extremity Assessment Upper Extremity Assessment: Overall WFL for tasks assessed   Lower Extremity Assessment Lower Extremity Assessment: LLE deficits/detail LLE Deficits / Details: Reports numbness and pain.        Communication Communication Communication: No difficulties   Cognition Arousal/Alertness: Awake/alert Behavior During Therapy: WFL for tasks assessed/performed Overall Cognitive Status: Within Functional Limits for tasks assessed                                     General Comments  Daughter and wife present throughout session.     Exercises     Shoulder Instructions      Home Living Family/patient expects to be discharged to:: Private residence Living Arrangements: Spouse/significant other Available Help at Discharge: Family;Available 24 hours/day Type of Home: House Home Access: Stairs to enter     Home Layout: Laundry or work area in basement     [employer] Shower/Tub: Occupational psychologist: Standard     Home Equipment: None(has wife's standard walker and borrowed w/c)          Prior Functioning/Environment Level of Independence: Independent        Comments: Was independent prior to initial surgery. However, pt with increasing debility with increasing pain over the past weeks since initial surgery requiring assistance for all ADL.         OT Problem List: Decreased strength;Decreased range of motion;Decreased activity tolerance;Impaired balance (sitting and/or standing);Decreased safety awareness;Decreased knowledge of use of DME or AE;Decreased knowledge of precautions;Pain      OT Treatment/Interventions:      OT Goals(Current goals can be found in the care plan section) Acute Rehab OT Goals Patient Stated Goal: "for that pain not to  come back" OT Goal Formulation: With patient  OT Frequency:     Barriers to D/C:            Co-evaluation              AM-PAC PT "6 Clicks" Daily Activity     Outcome Measure Help from another person eating meals?: None Help from another person taking care of personal grooming?: None Help from another person toileting, which includes using toliet, bedpan, or urinal?: A Little Help from another person bathing (including washing, rinsing, drying)?: A Little Help from another person to put on and taking off regular upper body clothing?: None Help from another person to put on and taking off regular lower body clothing?: A Little 6 Click Score: 21   End of Session Nurse Communication: Mobility status  Activity Tolerance: Patient tolerated treatment well Patient left: in bed;with call bell/phone within reach;with family/visitor present  OT Visit Diagnosis: Other abnormalities of gait and mobility (R26.89);Pain Pain - Right/Left: Left Pain - part of body: Leg(back at incision)                Time: 1610-9604 OT Time Calculation (min): 17 min Charges:  OT General Charges $OT Visit: 1 Visit OT Evaluation $OT Eval Low Complexity: 1 Low G-Codes:     Norman Herrlich, MS OTR/L  Pager: [place]  09/24/2017, 9:14 AM

## 2017-09-25 ENCOUNTER — Other Ambulatory Visit: Payer: Self-pay

## 2017-09-25 ENCOUNTER — Telehealth: Payer: Self-pay | Admitting: *Deleted

## 2017-09-25 MED ORDER — INSULIN ASPART 100 UNIT/ML ~~LOC~~ SOLN: SUBCUTANEOUS | 0 refills | Status: DC

## 2017-09-25 NOTE — Telephone Encounter (Signed)
Copied from [place] [phone]. Topic: General - Other >> Sep 25, 2017  1:05 PM Yvette Rack wrote: Reason for CRM: pt daughter Sharyn Lull [phone] calling stating that the Novalog wasn't at pharmacy and that she would like to get some insuline in her fathers system

## 2017-09-25 NOTE — Telephone Encounter (Signed)
[facility]ed [facility]([place] checked if faxed prescription was received for Novolog insulin(sliding scale), spoke to [place]. She checked did not get and I re faxed the prescription. Confirmation page received at 2:11 pm.

## 2017-09-25 NOTE — Telephone Encounter (Signed)
Pt has checked with pharmacy and they did not receive RX. [facility] [place]

## 2017-09-26 ENCOUNTER — Ambulatory Visit (HOSPITAL_COMMUNITY): Payer: BLUE CROSS/BLUE SHIELD

## 2017-09-26 NOTE — Telephone Encounter (Signed)
Already refilled in another encounter 09/25/17.

## 2017-10-02 ENCOUNTER — Other Ambulatory Visit: Payer: Self-pay | Admitting: Family Medicine

## 2017-10-02 NOTE — Telephone Encounter (Signed)
Please advise on refill. Last seen in office was 03/31/2017

## 2017-10-03 ENCOUNTER — Telehealth: Payer: Self-pay | Admitting: *Deleted

## 2017-10-03 MED ORDER — INSULIN ASPART 100 UNIT/ML ~~LOC~~ SOLN: SUBCUTANEOUS | 0 refills | Status: DC

## 2017-10-03 NOTE — Telephone Encounter (Signed)
[facility] [place] spoke with Lexine Baton, she checked to see if faxed prescription was received for insulin (NOVOLOG) injection sliding scale. I told Lexine Baton I faxed Rx on 09/25/17 and received confirmation page. She checked the system and told me the patient picked up [facility] [place]. I called spoke to [place] at [facility] location to confirm the pick up. Dr Carlota Raspberry reprinted Rx on 10/03/2017, which it was already faxed and picked up.

## 2017-10-03 NOTE — Telephone Encounter (Signed)
Please see mychart emails. Should have been faxed in already. Let me know if new rx needed.

## 2017-10-08 ENCOUNTER — Other Ambulatory Visit: Payer: Self-pay | Admitting: Neurological Surgery

## 2017-10-08 ENCOUNTER — Other Ambulatory Visit (HOSPITAL_COMMUNITY): Payer: Self-pay | Admitting: Neurological Surgery

## 2017-10-08 DIAGNOSIS — M9983 Other biomechanical lesions of lumbar region: Secondary | ICD-10-CM

## 2017-10-09 ENCOUNTER — Ambulatory Visit
Admission: RE | Admit: 2017-10-09 | Discharge: 2017-10-09 | Disposition: A | Discharge status: Home / Self Care | Payer: BLUE CROSS/BLUE SHIELD | Source: Ambulatory Visit | Attending: Neurological Surgery | Admitting: Neurological Surgery

## 2017-10-09 DIAGNOSIS — M9983 Other biomechanical lesions of lumbar region: Secondary | ICD-10-CM

## 2017-10-09 MED ORDER — GADOBENATE DIMEGLUMINE 529 MG/ML IV SOLN
18.0000 mL | Freq: Once | INTRAVENOUS | Status: AC | PRN
Start: 2017-10-09 — End: 2017-10-09
  Administered 2017-10-09: 18 mL via INTRAVENOUS

## 2017-10-11 ENCOUNTER — Other Ambulatory Visit: Payer: Self-pay | Admitting: Family Medicine

## 2017-10-12 ENCOUNTER — Other Ambulatory Visit: Payer: BLUE CROSS/BLUE SHIELD

## 2017-10-14 ENCOUNTER — Other Ambulatory Visit: Payer: Self-pay | Admitting: Family Medicine

## 2017-10-16 ENCOUNTER — Other Ambulatory Visit: Payer: Self-pay | Admitting: Family Medicine

## 2017-10-16 MED ORDER — INSULIN ASPART 100 UNIT/ML ~~LOC~~ SOLN: SUBCUTANEOUS | 0 refills | Status: DC

## 2017-10-16 NOTE — Telephone Encounter (Signed)
Refilled, printed - please fax to pharmacy - placed in my front chart box.

## 2017-10-16 NOTE — Telephone Encounter (Signed)
Did not see that a printed script went to pharmacy. Please advise one refill

## 2017-10-17 NOTE — Telephone Encounter (Signed)
[facility]

## 2017-10-24 ENCOUNTER — Other Ambulatory Visit: Payer: Self-pay | Admitting: *Deleted

## 2017-10-24 DIAGNOSIS — I10 Essential (primary) hypertension: Secondary | ICD-10-CM

## 2017-10-24 DIAGNOSIS — Z01818 Encounter for other preprocedural examination: Secondary | ICD-10-CM

## 2017-10-29 ENCOUNTER — Other Ambulatory Visit: Payer: BLUE CROSS/BLUE SHIELD | Admitting: *Deleted

## 2017-10-29 DIAGNOSIS — Z01818 Encounter for other preprocedural examination: Secondary | ICD-10-CM

## 2017-10-29 DIAGNOSIS — I10 Essential (primary) hypertension: Secondary | ICD-10-CM

## 2017-10-29 LAB — BASIC METABOLIC PANEL
BUN / CREAT RATIO: 14 (ref 10–24)
BUN: 13 mg/dL (ref 8–27)
CALCIUM: 10 mg/dL (ref 8.6–10.2)
CO2: 25 mmol/L (ref 20–29)
Chloride: 103 mmol/L (ref 96–106)
Creatinine, Ser: 0.91 mg/dL (ref 0.76–1.27)
GFR calc Af Amer: 105 mL/min/{1.73_m2} (ref >59)
GFR calc non Af Amer: 91 mL/min/{1.73_m2} (ref >59)
GLUCOSE: 164 mg/dL — AB (ref 65–99)
Potassium: 4.5 mmol/L (ref 3.5–5.2)
Sodium: 145 mmol/L — ABNORMAL HIGH (ref 134–144)

## 2017-10-31 ENCOUNTER — Ambulatory Visit (HOSPITAL_COMMUNITY)
Admission: RE | Admit: 2017-10-31 | Discharge: 2017-10-31 | Disposition: A | Discharge status: Home / Self Care | Payer: BLUE CROSS/BLUE SHIELD | Source: Ambulatory Visit | Attending: Cardiovascular Disease | Admitting: Cardiovascular Disease

## 2017-10-31 ENCOUNTER — Encounter (HOSPITAL_COMMUNITY): Payer: Self-pay

## 2017-10-31 DIAGNOSIS — R0789 Other chest pain: Secondary | ICD-10-CM | POA: Diagnosis present

## 2017-10-31 DIAGNOSIS — R079 Chest pain, unspecified: Secondary | ICD-10-CM

## 2017-10-31 MED ORDER — METOPROLOL TARTRATE 5 MG/5ML IV SOLN
INTRAVENOUS | Status: AC
  Administered 2017-10-31: 5 mg via INTRAVENOUS
  Filled 2017-10-31: qty 20

## 2017-10-31 MED ORDER — NITROGLYCERIN 0.4 MG SL SUBL
0.8000 mg | SUBLINGUAL_TABLET | Freq: Once | SUBLINGUAL | Status: AC
  Administered 2017-10-31: 0.8 mg via SUBLINGUAL
  Filled 2017-10-31: qty 25

## 2017-10-31 MED ORDER — METOPROLOL TARTRATE 5 MG/5ML IV SOLN
5.0000 mg | INTRAVENOUS | Status: DC | PRN
  Administered 2017-10-31 (×4): 5 mg via INTRAVENOUS
  Filled 2017-10-31 (×5): qty 5

## 2017-10-31 MED ORDER — IOPAMIDOL (ISOVUE-370) INJECTION 76%
100.0000 mL | Freq: Once | INTRAVENOUS | Status: AC | PRN
  Administered 2017-10-31: 80 mL via INTRAVENOUS

## 2017-10-31 MED ORDER — NITROGLYCERIN 0.4 MG SL SUBL
SUBLINGUAL_TABLET | SUBLINGUAL | Status: AC
  Administered 2017-10-31: 0.8 mg via SUBLINGUAL
  Filled 2017-10-31: qty 2

## 2017-11-23 ENCOUNTER — Other Ambulatory Visit: Payer: Self-pay | Admitting: Family Medicine

## 2017-11-24 ENCOUNTER — Other Ambulatory Visit: Payer: Self-pay | Admitting: Family Medicine

## 2017-11-24 DIAGNOSIS — I10 Essential (primary) hypertension: Secondary | ICD-10-CM

## 2017-12-02 DIAGNOSIS — C44319 Basal cell carcinoma of skin of other parts of face: Secondary | ICD-10-CM | POA: Insufficient documentation

## 2017-12-02 DIAGNOSIS — C4441 Basal cell carcinoma of skin of scalp and neck: Secondary | ICD-10-CM | POA: Insufficient documentation

## 2018-01-12 ENCOUNTER — Ambulatory Visit: Payer: BLUE CROSS/BLUE SHIELD | Admitting: Family Medicine

## 2018-01-12 ENCOUNTER — Other Ambulatory Visit: Payer: Self-pay

## 2018-01-12 ENCOUNTER — Encounter: Payer: Self-pay | Admitting: Family Medicine

## 2018-01-12 ENCOUNTER — Other Ambulatory Visit: Payer: Self-pay | Admitting: Family Medicine

## 2018-01-12 VITALS — BP 160/98 | HR 75 | Temp 98.7°F | Ht 65.0 in | Wt 210.8 lb

## 2018-01-12 DIAGNOSIS — R3911 Hesitancy of micturition: Secondary | ICD-10-CM

## 2018-01-12 DIAGNOSIS — R7989 Other specified abnormal findings of blood chemistry: Secondary | ICD-10-CM

## 2018-01-12 DIAGNOSIS — R945 Abnormal results of liver function studies: Secondary | ICD-10-CM | POA: Diagnosis not present

## 2018-01-12 DIAGNOSIS — E291 Testicular hypofunction: Secondary | ICD-10-CM | POA: Diagnosis not present

## 2018-01-12 DIAGNOSIS — E1140 Type 2 diabetes mellitus with diabetic neuropathy, unspecified: Secondary | ICD-10-CM

## 2018-01-12 DIAGNOSIS — I10 Essential (primary) hypertension: Secondary | ICD-10-CM

## 2018-01-12 NOTE — Progress Notes (Signed)
Subjective:    Patient ID: Francis Cross, male    DOB: 1955-04-02, 62 y.o.   MRN: [id number]  HPI Francis Cross is a 62 y.o. male Presents today for: Chief Complaint  Patient presents with  . Diabetes  . Medication Refill   Last office visit with me in January of this year.  He has been having some issues with his back, including epidural abscess and required lumbar spine surgery, with some radiculitis as well based on August emails.  He has been cared for by Dr. Ellene Route. Still some sciatic pain and ack pain, some leg numbness.  Still on lifting restrictions, and physical therapy.   Diabetes, with diabetic neuropathy: He has used Trulicity, and was taking 0.75 mg once per week.  Januvia was discontinued in January as he was taking Trulicity.  A1c was 7.1 at that time, but was increased to 1.5 mg once per week of Trulicity.  We did have to use sliding scale insulin while he was taking prednisone from back issues as above.  Most recent A1c in July was elevated at 9.2. Home readings have been improving in 120-130's.  Off sliding scale few months. On Trulicity once per week - no new side effects. Intolerant to any metformin d/t GI side effects  Normal urine microalbumin in September 2018.  Ophthalmology 07/12/2017 - no reported retinopathy.  LFTs mildly elevated 5 months ago (AST 42 up from 25 previously, ALT 61 up from 51 previously) plan on repeat today. Foot exam today Diabetic Foot Exam - Simple   Simple Foot Form Diabetic Foot exam was performed with the following findings:  Yes 01/12/2018 11:53 AM  Visual Inspection No deformities, no ulcerations, no other skin breakdown bilaterally:  Yes Sensation Testing Intact to touch and monofilament testing bilaterally:  Yes Pulse Check Posterior Tibialis and Dorsalis pulse intact bilaterally:  Yes Comments   He is on ACE inhibitor with lisinopril HCT  Repatha for hyperlipidemia.  LDL in May was 49. Lab Results  Component Value Date   CHOL 129 07/25/2017   HDL 50 07/25/2017   LDLCALC 49 07/25/2017   LDLDIRECT 152.1 10/12/2008   TRIG 148 07/25/2017   CHOLHDL 2.6 07/25/2017   Lab Results  Component Value Date   HGBA1C 9.2 (H) 09/23/2017   Hypertension: BP Readings from Last 3 Encounters:  01/12/18 (!) 160/98  10/31/17 130/84  09/24/17 114/69   Lab Results  Component Value Date   CREATININE 0.91 10/29/2017  Currently is taking lisinopril HCTZ 20/12.5 mg daily.  Headache last night into this am.  Has not taken meds yet today. Usual in am.  BP 120/80 at appt last week.    Basal cell carcinoma of left temple and right nasal tip.   Treated by dermatology with Mohs procedure in October.  Dr. Billy Fischer at [place] [facility]..   Urinary hesitancy Noted after kidney stone the first of the year. Followed by urology. Trouble urinating better than earlier in the year. No recent urology appt. PSA 0.8 in January.   Hypogonadism: Off testosterone for past 2 months. Had been on testosterone cream prior.   Patient Active Problem List   Diagnosis Date Noted  . Herniated nucleus pulposus, L4-5 left 09/23/2017  . Hx of carpal tunnel repair 12/22/2015  . Testosterone deficiency 05/23/2014  . Pain in the chest 10/01/2013  . GOUT, UNSPECIFIED 01/29/2010  . Obesity 12/06/2008  . IRON OVERLOAD 08/11/2007  . HIATAL HERNIA WITH REFLUX 07/08/2006  . Hypercholesteremia 06/12/2006  .  HYPERTENSION 06/12/2006  . 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 Acid reflux   . Cancer ([place])    skin cancer- basal cell on nose  . Diabetes mellitus without complication ([place])   . Difficult intubation   . Erectile dysfunction   . Fatty liver   . Gout   . Hemochromatosis   . Hiatal hernia   . History of kidney stones   . Hyperlipidemia   . Hypertension   . Iron overload   . Obesity    Past Surgical History:  Procedure Laterality Date  . APPENDECTOMY    . BACK SURGERY  09/09/2017     lumbar discectomy   . CARDIAC CATHETERIZATION     [place]    . KIDNEY STONE SURGERY  03/07/2017   laser blast  . LUMBAR LAMINECTOMY/DECOMPRESSION MICRODISCECTOMY N/A 09/23/2017   Procedure: Lumbar Four-Five Redo Discectomy for epidural hematoma.;  Surgeon: Kristeen Miss, MD;  Location: [facility];  Service: Neurosurgery;  Laterality: N/A;  . TONSILLECTOMY     Allergies  Allergen Reactions  . Cephalexin Rash  . Penicillins Itching, Rash and Other (See Comments)    Has patient had a PCN reaction causing immediate rash, facial/tongue/throat swelling, SOB or lightheadedness with hypotension: No Has patient had a PCN reaction causing severe rash involving mucus membranes or skin necrosis: No Has patient had a PCN reaction that required hospitalization: No Has patient had a PCN reaction occurring within the last 10 years: No If all of the above answers are "NO", then may proceed with Cephalosporin use.    Prior to Admission medications   Medication Sig Start Date End Date Taking? Authorizing Provider  cyclobenzaprine (FLEXERIL) 10 MG tablet Take 10 mg by mouth every 8 (eight) hours as needed for muscle spasms. 09/18/17  Yes [provider]  ibuprofen (ADVIL,MOTRIN) 200 MG tablet Take 400 mg by mouth every 6 (six) hours as needed for headache or moderate pain.   Yes [provider]  ketoconazole (NIZORAL) 2 % shampoo APPLY TOPICALLY 2 (TWO) TIMES A WEEK. 04/11/16  Yes Shawnee Knapp, MD  ketorolac (ACULAR) 0.5 % ophthalmic solution Place 1 drop into both eyes 2 (two) times daily as needed. 04/11/16  Yes Shawnee Knapp, MD  lisinopril-hydrochlorothiazide (PRINZIDE,ZESTORETIC) 20-12.5 MG tablet TAKE 1 TABLET BY MOUTH EVERY DAY 11/24/17  Yes [name], MD  omeprazole (PRILOSEC) 20 MG capsule Take 1 capsule (20 mg total) by mouth daily. 04/24/16  Yes Danis, Kirke Corin, MD  oxyCODONE-acetaminophen (PERCOCET/ROXICET) 5-325 MG tablet Take 1-2 tablets by mouth every 4 (four)  hours as needed for severe pain. 09/18/17  Yes [provider]  [place] 420 MG/3.5ML SOCT INJECT 1 CARTRIDGE (420 MG) INTO THE SKIN EVERY 30 DAYS 07/21/17  Yes Wellington Hampshire, MD  TRULICITY 1.5 LN/9.8XQ SOPN INJECT 1.5 MG INTO THE SKIN ONCE A WEEK. 10/13/17  Yes [name], MD  UNABLE TO FIND TESTOSTERONE LIPODERM 5% (50 MG/ML) CREAM. Apply 2 ml (8 clicks) topically once a day as directed. 03/31/17  Yes [name], MD  insulin aspart (NOVOLOG) 100 UNIT/ML injection PLEASE SEE ATTACHED FOR DETAILED DIRECTIONS Patient not taking: Reported on 01/12/2018 11/24/17   [name], MD   Social History   Socioeconomic History  . Marital status: Married    Spouse name: Not on file  . Number of children: 3  . Years of education: Not on file  .  Highest education level: Not on file  Occupational History  . Occupation: retired  Scientific laboratory technician  . Financial resource strain: Not on file  . Food insecurity:    Worry: Not on file    Inability: Not on file  . Transportation needs:    Medical: Not on file    Non-medical: Not on file  Tobacco Use  . Smoking status: Former Research scientist (life sciences)  . Smokeless tobacco: Former Network engineer and Sexual Activity  . Alcohol use: Not Currently  . Drug use: No  . Sexual activity: Not on file  Lifestyle  . Physical activity:    Days per week: Not on file    Minutes per session: Not on file  . Stress: Not on file  Relationships  . Social connections:    Talks on phone: Not on file    Gets together: Not on file    Attends religious service: Not on file    Active member of club or organization: Not on file    Attends meetings of clubs or organizations: Not on file    Relationship status: Not on file  . Intimate partner violence:    Fear of current or ex partner: Not on file    Emotionally abused: Not on file    Physically abused: Not on file    Forced sexual activity: Not on file  Other Topics Concern  . Not on file    Social History Narrative  . Not on file    Review of Systems Per hpi.     Objective:   Physical Exam  Constitutional: He is oriented to person, place, and time. He appears well-developed and well-nourished.  HENT:  Head: Normocephalic and atraumatic.  Eyes: Pupils are equal, round, and reactive to light. EOM are normal.  Neck: No JVD present. Carotid bruit is not present.  Cardiovascular: Normal rate, regular rhythm and normal heart sounds.  No murmur heard. Pulmonary/Chest: Effort normal and breath sounds normal. He has no rales.  Musculoskeletal: He exhibits no edema.  Neurological: He is alert and oriented to person, place, and time.  Skin: Skin is warm and dry.  Psychiatric: He has a normal mood and affect.  Vitals reviewed.  Vitals:   01/12/18 1148 01/12/18 1159  BP: (!) 164/94 (!) 160/98  Pulse: 75   Temp: 98.7 F (37.1 C)   TempSrc: Oral   SpO2: 96%   Weight: 210 lb 12.8 oz (95.6 kg)   Height: 5\' 5"  (1.651 m)         Assessment & Plan:  Odilon Doty is a 62 y.o. male Type 2 diabetes mellitus with diabetic neuropathy, without long-term current use of insulin ([place]) - Plan: Microalbumin/Creatinine Ratio, Urine, Comprehensive metabolic panel, Hemoglobin I4P  -Continue Trulicity same dose, check A1c.  Anticipate improved readings now off prednisone and from previous surgeries.  Off sliding scale insulin  Elevated LFTs - Plan: Comprehensive metabolic panel  -Check labs, no changes for now.  Urinary hesitancy - Plan: PSA  -PSA obtained, but advised to follow-up with urology to discuss symptoms further.  Hypogonadism male - Plan: Testosterone Total,Free,Bio, Males, Testosterone, Free, Total, SHBG  -Off treatment.  Check baseline levels, and will need to be verified between 8 and 10 AM with repeat testing.  Also recommended meeting with urology for urinary symptoms prior to restarting any testosterone supplementation.  Hypertension  -Elevated in office reading, but  home readings have been controlled and has not taken medication today.  Continue same regimen for now.  No orders of the defined types were placed in this encounter.  Patient Instructions   Continue trulicity for now. I will check A1c today.   I will recheck liver tests today.   I will check PSA, but follow up with urology to discuss urinary symptoms.   testosterone checked today, but if low will need to be repeated b/t 8 and 10am.  I would recommend meeting with urology before restarting supplement given your urinary symptoms.   If you have lab work done today you will be contacted with your lab results within the next 2 weeks.  If you have not heard from [place] then please contact us. The fastest way to get your results is to register for My Chart.   IF you received an x-ray today, you will receive an invoice from [facility] Rad[facility]. Please contact [facility] Radiology at [phone] with questions or concerns regarding your invoice.   IF you received labwork today, you will receive an invoice from [place]. Please contact [facility] at [phone] with questions or concerns regarding your invoice.   Our billing staff will not be able to assist you with questions regarding bills from these companies.  You will be contacted with the lab results as soon as they are available. The fastest way to get your results is to activate your My Chart account. Instructions are located on the last page of this paperwork. If you have not heard from [place] regarding the results in 2 weeks, please contact this office.       Signed,   [name], MD [facility] at [facility].  01/17/18 9:10 AM

## 2018-01-12 NOTE — Patient Instructions (Addendum)
Continue trulicity for now. I will check A1c today.   I will recheck liver tests today.   I will check PSA, but follow up with urology to discuss urinary symptoms.   testosterone checked today, but if low will need to be repeated b/t 8 and 10am.  I would recommend meeting with urology before restarting supplement given your urinary symptoms.   If you have lab work done today you will be contacted with your lab results within the next 2 weeks.  If you have not heard from [place] then please contact us. The fastest way to get your results is to register for My Chart.   IF you received an x-ray today, you will receive an invoice from [facility]. Please contact [facility] Radiology at [phone] with questions or concerns regarding your invoice.   IF you received labwork today, you will receive an invoice from [place]. Please contact [facility] at [phone] with questions or concerns regarding your invoice.   Our billing staff will not be able to assist you with questions regarding bills from these companies.  You will be contacted with the lab results as soon as they are available. The fastest way to get your results is to activate your My Chart account. Instructions are located on the last page of this paperwork. If you have not heard from [place] regarding the results in 2 weeks, please contact this office.

## 2018-01-13 LAB — COMPREHENSIVE METABOLIC PANEL
A/G RATIO: 2.2 (ref 1.2–2.2)
ALK PHOS: 72 IU/L (ref 39–117)
ALT: 57 IU/L — ABNORMAL HIGH (ref 0–44)
AST: 40 IU/L (ref 0–40)
Albumin: 4.7 g/dL (ref 3.6–4.8)
BILIRUBIN TOTAL: 0.4 mg/dL (ref 0.0–1.2)
BUN / CREAT RATIO: 14 (ref 10–24)
BUN: 12 mg/dL (ref 8–27)
CHLORIDE: 100 mmol/L (ref 96–106)
CO2: 24 mmol/L (ref 20–29)
CREATININE: 0.87 mg/dL (ref 0.76–1.27)
Calcium: 9.7 mg/dL (ref 8.6–10.2)
GFR calc Af Amer: 108 mL/min/{1.73_m2} (ref >59)
GFR calc non Af Amer: 93 mL/min/{1.73_m2} (ref >59)
GLOBULIN, TOTAL: 2.1 g/dL (ref 1.5–4.5)
Glucose: 159 mg/dL — ABNORMAL HIGH (ref 65–99)
Potassium: 3.9 mmol/L (ref 3.5–5.2)
Sodium: 139 mmol/L (ref 134–144)
TOTAL PROTEIN: 6.8 g/dL (ref 6.0–8.5)

## 2018-01-13 LAB — PSA: PROSTATE SPECIFIC AG, SERUM: 0.9 ng/mL (ref 0.0–4.0)

## 2018-01-13 LAB — HEMOGLOBIN A1C
ESTIMATED AVERAGE GLUCOSE: 154 mg/dL
HEMOGLOBIN A1C: 7 % — AB (ref 4.8–5.6)

## 2018-01-13 LAB — TESTOSTERONE, FREE, TOTAL, SHBG
Sex Hormone Binding: 31 nmol/L (ref 19.3–76.4)
TESTOSTERONE: 225 ng/dL — AB (ref 264–916)
Testosterone, Free: 5.1 pg/mL — ABNORMAL LOW (ref 6.6–18.1)

## 2018-01-13 LAB — MICROALBUMIN / CREATININE URINE RATIO
CREATININE, UR: 33.9 mg/dL
Microalb/Creat Ratio: <8.8 mg/g creat (ref 0.0–30.0)
Microalbumin, Urine: <3 ug/mL

## 2018-01-13 NOTE — Telephone Encounter (Signed)
Requested medication (s) are due for refill today: yes  Requested medication (s) are on the active medication list: yes  Last refill:  11/24/17  Future visit scheduled: yes  Notes to clinic:  Reported not taking 01/12/18    Requested Prescriptions  Pending Prescriptions Disp Refills   NOVOLOG 100 UNIT/ML injection [Pharmacy Med Name: NOVOLOG 100 UNIT/ML VIAL] 10 mL 0    Sig: PLEASE SEE [place] DIRECTIONS     Endocrinology:  Diabetes - Insulins Failed - 01/12/2018  4:45 PM      Failed - HBA1C is between 0 and 7.9 and within 180 days    Hgb A1c MFr Bld  Date Value Ref Range Status  01/12/2018 7.0 (H) 4.8 - 5.6 % Final    Comment:             Prediabetes: 5.7 - 6.4          Diabetes: >6.4          Glycemic control for adults with diabetes: <7.0          Passed - Valid encounter within last 6 months    Recent Outpatient Visits          Yesterday Type 2 diabetes mellitus with diabetic neuropathy, without long-term current use of insulin ([place])   [facility], Ranell Patrick, MD   5 months ago Type 2 diabetes mellitus with diabetic neuropathy, without long-term current use of insulin [facility])   [facility] at [place], PA-C   9 months ago Type 2 diabetes mellitus with diabetic neuropathy, without long-term current use of insulin [facility])   [facility], Ranell Patrick, MD   1 year ago Type 2 diabetes mellitus with diabetic neuropathy, without long-term current use of insulin [facility])   [facility], Ranell Patrick, MD   1 year ago Encounter for commercial driver medical examination (CDME)   [facility], Ranell Patrick, MD      Future Appointments            In 3 months Carlota Raspberry Ranell Patrick, MD [facility] [place], [facility]

## 2018-01-17 ENCOUNTER — Encounter: Payer: Self-pay | Admitting: Family Medicine

## 2018-01-17 MED ORDER — LISINOPRIL-HYDROCHLOROTHIAZIDE 20-12.5 MG PO TABS: 1.0000 | ORAL_TABLET | Freq: Every day | ORAL | 1 refills | Status: DC

## 2018-01-17 MED ORDER — DULAGLUTIDE 1.5 MG/0.5ML ~~LOC~~ SOAJ: SUBCUTANEOUS | 1 refills | Status: DC

## 2018-01-20 ENCOUNTER — Encounter: Payer: Self-pay | Admitting: Family Medicine

## 2018-03-04 DIAGNOSIS — R059 Cough, unspecified: Secondary | ICD-10-CM | POA: Insufficient documentation

## 2018-03-11 DIAGNOSIS — R202 Paresthesia of skin: Secondary | ICD-10-CM | POA: Insufficient documentation

## 2018-04-20 ENCOUNTER — Ambulatory Visit: Payer: BLUE CROSS/BLUE SHIELD | Admitting: Family Medicine

## 2018-05-26 ENCOUNTER — Other Ambulatory Visit: Payer: Self-pay

## 2018-05-26 DIAGNOSIS — Z79890 Hormone replacement therapy (postmenopausal): Secondary | ICD-10-CM

## 2018-05-26 DIAGNOSIS — R7989 Other specified abnormal findings of blood chemistry: Secondary | ICD-10-CM

## 2018-05-26 DIAGNOSIS — R945 Abnormal results of liver function studies: Secondary | ICD-10-CM

## 2018-05-26 DIAGNOSIS — E1140 Type 2 diabetes mellitus with diabetic neuropathy, unspecified: Secondary | ICD-10-CM

## 2018-05-26 DIAGNOSIS — Z5181 Encounter for therapeutic drug level monitoring: Secondary | ICD-10-CM

## 2018-06-03 ENCOUNTER — Ambulatory Visit: Payer: BLUE CROSS/BLUE SHIELD | Admitting: Family Medicine

## 2018-06-03 DIAGNOSIS — N419 Inflammatory disease of prostate, unspecified: Secondary | ICD-10-CM | POA: Insufficient documentation

## 2018-06-04 ENCOUNTER — Other Ambulatory Visit: Payer: Self-pay | Admitting: *Deleted

## 2018-06-04 MED ORDER — REPATHA PUSHTRONEX SYSTEM 420 MG/3.5ML ~~LOC~~ SOCT: 420.0000 mg | SUBCUTANEOUS | 3 refills | Status: DC

## 2018-06-11 ENCOUNTER — Telehealth: Payer: Self-pay | Admitting: *Deleted

## 2018-06-11 NOTE — Telephone Encounter (Signed)
Patient has given consent for a web visit on 06/11/2018 via MyChart  I agree to tele visit and can do Webex I do have smart phone. My email is [email or web address] if you need it.  Thanks have a great day

## 2018-06-12 ENCOUNTER — Other Ambulatory Visit: Payer: Self-pay

## 2018-06-12 ENCOUNTER — Encounter: Payer: Self-pay | Admitting: Cardiovascular Disease

## 2018-06-12 ENCOUNTER — Telehealth (INDEPENDENT_AMBULATORY_CARE_PROVIDER_SITE_OTHER): Payer: BLUE CROSS/BLUE SHIELD | Admitting: Cardiovascular Disease

## 2018-06-12 DIAGNOSIS — E7800 Pure hypercholesterolemia, unspecified: Secondary | ICD-10-CM | POA: Diagnosis not present

## 2018-06-12 DIAGNOSIS — I2510 Atherosclerotic heart disease of native coronary artery without angina pectoris: Secondary | ICD-10-CM | POA: Diagnosis not present

## 2018-06-12 MED ORDER — LOSARTAN POTASSIUM-HCTZ 50-12.5 MG PO TABS: 1.0000 | ORAL_TABLET | Freq: Every day | ORAL | 6 refills | Status: DC

## 2018-06-12 NOTE — Progress Notes (Signed)
Virtual Visit via Video Note   This visit type was conducted due to national recommendations for restrictions regarding the COVID-19 Pandemic (e.g. social distancing) in an effort to limit this patient's exposure and mitigate transmission in our community.  Due to his co-morbid illnesses, this patient is at least at moderate risk for complications without adequate follow up.  This format is felt to be most appropriate for this patient at this time.  All issues noted in this document were discussed and addressed.  A limited physical exam was performed with this format.  Please refer to the patient's chart for his consent to telehealth for [facility].   Evaluation Performed:  Follow-up visit  Date:  06/12/2018   ID:  Francis Cross, Francis Cross [birth year], MRN [id number]  Patient Location: Home  Provider Location: Office  PCP:  [name], MD  Cardiologist:  Kathlyn Sacramento, MD  Electrophysiologist:  None   Chief Complaint: Routine follow-up visit.  History of Present Illness:    Francis Cross is a 63 y.o. male who presents via audio/video conferencing for a telehealth visit today.   This is a follow-up visit regarding severe hyperlipidemia and mild LAD plaque noted on CTA last year. He has known history of hypertension, hyperlipidemia and type 2 diabetes.  Previous nuclear stress test in 2015 was normal.  He has known history of intermittent atypical left-sided chest pains thought to be musculoskeletal. He has known history of severe hyperlipidemia with intolerance to statins and Zetia.    He has been tolerating Repatha.   CTA of the coronary arteries in August 2019 showed a calcium score of 3 with mild soft plaque in the proximal LAD.  He has been doing well overall with no shortness of breath.  He continues to have these episodes of sharp chest pain lasting for few seconds mostly at rest. He has been having dry cough and he thinks it is due to lisinopril.  The patient does not have  symptoms concerning for COVID-19 infection (fever, chills, cough, or new shortness of breath).    Past Medical History:  Diagnosis Date  . Acid reflux   . Cancer ([place])    skin cancer- basal cell on nose  . Diabetes mellitus without complication ([place])   . Difficult intubation   . Erectile dysfunction   . Fatty liver   . Gout   . Hemochromatosis   . Hiatal hernia   . History of kidney stones   . Hyperlipidemia   . Hypertension   . Iron overload   . Obesity    Past Surgical History:  Procedure Laterality Date  . APPENDECTOMY    . BACK SURGERY  09/09/2017   lumbar discectomy   . CARDIAC CATHETERIZATION     [place]    . KIDNEY STONE SURGERY  03/07/2017   laser blast  . LUMBAR LAMINECTOMY/DECOMPRESSION MICRODISCECTOMY N/A 09/23/2017   Procedure: Lumbar Four-Five Redo Discectomy for epidural hematoma.;  Surgeon: Kristeen Miss, MD;  Location: [place];  Service: Neurosurgery;  Laterality: N/A;  . TONSILLECTOMY       Current Meds  Medication Sig  . cyclobenzaprine (FLEXERIL) 10 MG tablet Take 10 mg by mouth every 8 (eight) hours as needed for muscle spasms.  . Dulaglutide (TRULICITY) 1.5 HU/7.6LY SOPN INJECT 1.5 MG INTO THE SKIN ONCE A WEEK.  Marland Kitchen ibuprofen (ADVIL,MOTRIN) 200 MG tablet Take 400 mg by mouth every 6 (six) hours as needed for headache or moderate pain.  Marland Kitchen ketoconazole (NIZORAL)  2 % shampoo APPLY TOPICALLY 2 (TWO) TIMES A WEEK.  Marland Kitchen ketorolac (ACULAR) 0.5 % ophthalmic solution Place 1 drop into both eyes 2 (two) times daily as needed.  Marland Kitchen lisinopril-hydrochlorothiazide (PRINZIDE,ZESTORETIC) 20-12.5 MG tablet Take 1 tablet by mouth daily.  Marland Kitchen omeprazole (PRILOSEC) 20 MG capsule Take 1 capsule (20 mg total) by mouth daily.  Marland Kitchen [place] 420 MG/3.5ML SOCT Inject 420 mg as directed every 30 (thirty) days.  . tamsulosin (FLOMAX) 0.4 MG CAPS capsule Take 0.4 mg by mouth.  Marland Kitchen UNABLE TO FIND TESTOSTERONE LIPODERM 5% (50 MG/ML) CREAM. Apply 2 ml (8 clicks)  topically once a day as directed.   Current Facility-Administered Medications for the 06/12/18 encounter (Telemedicine) with Wellington Hampshire, MD  Medication  . 0.9 %  sodium chloride infusion     Allergies:   Cephalexin and Penicillins   Social History   Tobacco Use  . Smoking status: Former Research scientist (life sciences)  . Smokeless tobacco: Former Network engineer Use Topics  . Alcohol use: Not Currently  . Drug use: No     Family Hx: The patient's family history includes Cancer in his father and mother; Hyperlipidemia in his sister; Hypertension in his sister. There is no history of Colon cancer, Stomach cancer, Rectal cancer, Esophageal cancer, or Liver cancer.  ROS:   Please see the history of present illness.     All other systems reviewed and are negative.   Prior CV studies:   The following studies were reviewed today:  CTA of the coronary arteries done in 2019 was reviewed with him today.  Labs/Other Tests and Data Reviewed:    EKG:  No ECG reviewed.  Recent Labs: 09/23/2017: Hemoglobin 16.0; Platelets 172 01/12/2018: ALT 57; BUN 12; Creatinine, Ser 0.87; Potassium 3.9; Sodium 139   Recent Lipid Panel Lab Results  Component Value Date/Time   CHOL 129 07/25/2017 10:52 AM   TRIG 148 07/25/2017 10:52 AM   HDL 50 07/25/2017 10:52 AM   CHOLHDL 2.6 07/25/2017 10:52 AM   CHOLHDL 2.6 11/28/2016 08:39 AM   LDLCALC 49 07/25/2017 10:52 AM   LDLCALC 52 11/28/2016 08:39 AM   LDLDIRECT 152.1 10/12/2008 07:26 AM    Wt Readings from Last 3 Encounters:  06/12/18 205 lb (93 kg)  01/12/18 210 lb 12.8 oz (95.6 kg)  09/23/17 206 lb (93.4 kg)     Objective:    Vital Signs:  BP (!) 143/87   Pulse 73   Ht 5\' 5"  (1.651 m)   Wt 205 lb (93 kg)   BMI 34.11 kg/m    Well nourished, well developed male in no acute distress.   ASSESSMENT & PLAN:    1. Mild coronary atherosclerosis: Noted on previous CTA with no obstructive disease.  Continue treatment of risk factors.  He has no symptoms  suggestive of angina 2. Severe hyperlipidemia: He is tolerating Repatha very well.  Most recent LDL was 49.  Previously it was 152. 3. Essential hypertension: He did not take his medication today and thus his blood pressure is mildly elevated.  He has been having dry cough which could be due to lisinopril and thus I elected to switch him to losartan-hydrochlorothiazide 50/12.5 mg once daily.  COVID-19 Education: The signs and symptoms of COVID-19 were discussed with the patient and how to seek care for testing (follow up with PCP or arrange E-visit).  The importance of social distancing was discussed today.  Time:   Today, I have spent 25 minutes with the patient  with telehealth technology discussing the above problems.     Medication Adjustments/Labs and Tests Ordered: Current medicines are reviewed at length with the patient today.  Concerns regarding medicines are outlined above.  Tests Ordered: No orders of the defined types were placed in this encounter.  Medication Changes: No orders of the defined types were placed in this encounter.   Disposition:  Follow up in 1 year(s)  Signed, Kathlyn Sacramento, MD  06/12/2018 3:41 PM    [place]

## 2018-06-12 NOTE — Patient Instructions (Signed)
Medication Instructions:  Stop taking lisinopril-hydrochlorothiazide and start losartan-hydrochlorothiazide 50/12.5 mg once daily.  A prescription was sent to your pharmacy.  If you need a refill on your cardiac medications before your next appointment, please call your pharmacy.   Lab work: None If you have labs (blood work) drawn today and your tests are completely normal, you will receive your results only by: Marland Kitchen MyChart Message (if you have MyChart) OR . A paper copy in the mail If you have any lab test that is abnormal or we need to change your treatment, we will call you to review the results.  Testing/Procedures: None  Follow-Up: At [facility], you and your health needs are our priority.  As part of our continuing mission to provide you with exceptional heart care, we have created designated Provider Care Teams.  These Care Teams include your primary Cardiologist (physician) and Advanced Practice Providers (APPs -  Physician Assistants and Nurse Practitioners) who all work together to provide you with the care you need, when you need it. You will need a follow up appointment in 1 years.  Please call our office 2 months in advance to schedule this appointment.  You may see Kathlyn Sacramento, MD or one of the following Advanced Practice Providers on your designated Care Team:   Murray Hodgkins, NP Christell Faith, PA-C . Marrianne Mood, PA-C

## 2018-06-16 ENCOUNTER — Encounter: Payer: Self-pay | Admitting: Family Medicine

## 2018-06-17 ENCOUNTER — Encounter: Payer: Self-pay | Admitting: Family Medicine

## 2018-06-17 ENCOUNTER — Other Ambulatory Visit: Payer: Self-pay

## 2018-06-17 ENCOUNTER — Telehealth (INDEPENDENT_AMBULATORY_CARE_PROVIDER_SITE_OTHER): Payer: BLUE CROSS/BLUE SHIELD | Admitting: Family Medicine

## 2018-06-17 ENCOUNTER — Ambulatory Visit (INDEPENDENT_AMBULATORY_CARE_PROVIDER_SITE_OTHER): Payer: BLUE CROSS/BLUE SHIELD | Admitting: Family Medicine

## 2018-06-17 DIAGNOSIS — E1140 Type 2 diabetes mellitus with diabetic neuropathy, unspecified: Secondary | ICD-10-CM

## 2018-06-17 DIAGNOSIS — I10 Essential (primary) hypertension: Secondary | ICD-10-CM | POA: Diagnosis not present

## 2018-06-17 DIAGNOSIS — E291 Testicular hypofunction: Secondary | ICD-10-CM | POA: Diagnosis not present

## 2018-06-17 DIAGNOSIS — R7989 Other specified abnormal findings of blood chemistry: Secondary | ICD-10-CM

## 2018-06-17 DIAGNOSIS — Z5181 Encounter for therapeutic drug level monitoring: Secondary | ICD-10-CM

## 2018-06-17 DIAGNOSIS — Z79890 Hormone replacement therapy (postmenopausal): Secondary | ICD-10-CM

## 2018-06-17 DIAGNOSIS — E1165 Type 2 diabetes mellitus with hyperglycemia: Secondary | ICD-10-CM

## 2018-06-17 DIAGNOSIS — R945 Abnormal results of liver function studies: Secondary | ICD-10-CM

## 2018-06-17 NOTE — Progress Notes (Signed)
Virtual Visit via Telephone Note  I connected with Francis Cross on 06/17/18 at 9:32 AM by telephone and verified that I am speaking with the correct person using two identifiers.   I discussed the limitations, risks, security and privacy concerns of performing an evaluation and management service by telephone and the availability of in person appointments. I also discussed with the patient that there may be a patient responsible charge related to this service. The patient expressed understanding and agreed to proceed, consent obtained  Chief complaint: diabetes  History of Present Illness: Last seen in November 2019.   Diabetes: Currently on Trulicity 1.5 mg weekly. Occasional sliding scale - last week few times.   Glucose in 300's last week. Thinks related to diet.  Glucose 207 this morning, lowest 140 when working out in yard this week.  Did have some back injections few months ago.  Did have prostate infection 2 weeks ago - now on doxycycline for 30 days by urology.  On ARB, and Repatha for hyperlipidemia. Plans on bloodwork soon.   Microalbumin: Normal ratio 01/12/2018 Optho, foot exam, pneumovax: Up-to-date  Lab Results  Component Value Date   HGBA1C 7.0 (H) 01/12/2018   HGBA1C 9.2 (H) 09/23/2017   HGBA1C 8.6 (H) 07/25/2017   Lab Results  Component Value Date   MICROALBUR 0.5 09/21/2015   LDLCALC 49 07/25/2017   CREATININE 0.87 01/12/2018   Hypogonadism: Testosterone 225, free testosterone 5.1, SHBG 31 in November.  Recommended repeating between 8 and 10 in the morning, PSA was normal.  Did recommend he meet with urology regarding some urinary symptoms prior to restarting testosterone supplementation.  Appointment 02/10/2018 with urology.  Treated with tamsulosin 0.4 mg daily.  Also recommended completing Bactrim for treatment of prostatitis symptoms at that time.  Hypertension: BP Readings from Last 3 Encounters:  06/12/18 (!) 143/87  01/12/18 (!) 160/98    10/31/17 130/84   Lab Results  Component Value Date   CREATININE 0.87 01/12/2018  Elevated at November visit, but had not taken medication yet that day and home readings have been controlled.  Continue same regimen at that time with losartan HCT 50/12.5 mg daily.  Cardiologist Dr. Fletcher Anon. Changed form ACE to ARB for possible cough. Home BP 135/84 this am.     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   Past Medical History:  Diagnosis Date   Acid reflux    Cancer ([place])    skin cancer- basal cell on nose   Diabetes mellitus without complication ([place])    Difficult intubation    Erectile dysfunction    Fatty liver    Gout    Hemochromatosis    Hiatal hernia    History of kidney stones    Hyperlipidemia    Hypertension    Iron overload    Obesity    Past Surgical History:  Procedure Laterality Date   APPENDECTOMY     BACK SURGERY  09/09/2017   lumbar discectomy    CARDIAC CATHETERIZATION     [facility]   CERVICAL FUSION     KIDNEY STONE SURGERY  03/07/2017   laser  blast   LUMBAR LAMINECTOMY/DECOMPRESSION MICRODISCECTOMY N/A 09/23/2017   Procedure: Lumbar Four-Five Redo Discectomy for epidural hematoma.;  Surgeon: Kristeen Miss, MD;  Location: [place];  Service: Neurosurgery;  Laterality: N/A;   TONSILLECTOMY     Allergies  Allergen Reactions   Cephalexin Rash   Penicillins Itching, Rash and Other (See Comments)    Has patient had a PCN reaction causing immediate rash, facial/tongue/throat swelling, SOB or lightheadedness with hypotension: No Has patient had  a PCN reaction causing severe rash involving mucus membranes or skin necrosis: No Has patient had a PCN reaction that required hospitalization: No Has patient had a PCN reaction occurring within the last 10 years: No If all of the above answers are "NO", then may proceed with Cephalosporin use.    Prior to Admission medications   Medication Sig Start Date End Date Taking? Authorizing Provider  Dulaglutide (TRULICITY) 1.5 CN/4.7SJ SOPN INJECT 1.5 MG INTO THE SKIN ONCE A WEEK. 01/17/18  Yes [name], MD  ibuprofen (ADVIL,MOTRIN) 200 MG tablet Take 400 mg by mouth every 6 (six) hours as needed for headache or moderate pain.   Yes [provider]  ketoconazole (NIZORAL) 2 % shampoo APPLY TOPICALLY 2 (TWO) TIMES A WEEK. 04/11/16  Yes Shawnee Knapp, MD  ketorolac (ACULAR) 0.5 % ophthalmic solution Place 1 drop into both eyes 2 (two) times daily as needed. 04/11/16  Yes Shawnee Knapp, MD  losartan-hydrochlorothiazide (HYZAAR) 50-12.5 MG tablet Take 1 tablet by mouth daily. 06/12/18  Yes Wellington Hampshire, MD  omeprazole (PRILOSEC) 20 MG capsule Take 1 capsule (20 mg total) by mouth daily. 04/24/16  Yes Danis, Kirke Corin, MD  cyclobenzaprine (FLEXERIL) 10 MG tablet Take 10 mg by mouth every 8 (eight) hours as needed for muscle spasms. 09/18/17   [provider]  oxyCODONE-acetaminophen (PERCOCET/ROXICET) 5-325 MG tablet Take 1-2 tablets by mouth every 4 (four) hours as needed for severe pain. 09/18/17   [provider]  [place] 420 MG/3.5ML SOCT Inject 420 mg as directed every 30 (thirty) days. Patient not taking: Reported on 06/17/2018 06/04/18   Wellington Hampshire, MD  tamsulosin (FLOMAX) 0.4 MG CAPS capsule Take 0.4 mg by mouth.    [provider]  UNABLE TO FIND TESTOSTERONE LIPODERM 5% (50 MG/ML) CREAM. Apply 2 ml (8 clicks) topically once a day as directed. Patient not taking: Reported on 06/17/2018 03/31/17   [name], MD   Social History    Socioeconomic History   Marital status: Married    Spouse name: Not on file   Number of children: 3   Years of education: Not on file   Highest education level: Not on file  Occupational History   Occupation: retired  Scientist, product/process development strain: Not on file   Food insecurity:    Worry: Not on file    Inability: Not on file   Transportation needs:    Medical: Not on file    Non-medical: Not on file  Tobacco Use   Smoking status: Former Smoker   Smokeless tobacco: Former Network engineer and Sexual Activity   Alcohol use: Not Currently   Drug use: No   Sexual activity: Not on file  Lifestyle   Physical activity:    Days per week: Not on file    Minutes per session: Not on file   Stress: Not on file  Relationships   Social connections:    Talks on phone: Not on  file    Gets together: Not on file    Attends religious service: Not on file    Active member of club or organization: Not on file    Attends meetings of clubs or organizations: Not on file    Relationship status: Not on file   Intimate partner violence:    Fear of current or ex partner: Not on file    Emotionally abused: Not on file    Physically abused: Not on file    Forced sexual activity: Not on file  Other Topics Concern   Not on file  Social History Narrative   Not on file     Observations/Objective: Speaking normally with no distress on phone.  Appropriate responses.  Assessment and Plan: Type 2 diabetes mellitus with hyperglycemia, without long-term current use of insulin ([place])  -Previously had improved control with A1c of 7.0.  Recent significant elevated readings, may be in part due to concurrent infection/prostatitis and dietary indiscretion.  Lowest 140.  Has had to use sliding scale insulin.  -Check fastings, 2-hour postprandial with 2 readings per day, continue sliding scale along with Trulicity at this time with follow-up in 1 week to review readings and  plan further at that time.  ER precautions/RTC precautions if needed  Hypogonadism male  -Plan for repeat testosterone, but with current prostate issues will likely need to coordinate with urology before restarting treatment as discussed prior.  Essential hypertension  -Stable based on home readings, recently changed to ARB by his cardiologist.  Follow Up Instructions: 1 week Patient Instructions  Good talking to you today. Sorry to here the blood sugars have gone up, but I think you may be right - could be due to infection as well as diet.  Check fasting blood sugar and then a 2-hour postprandial or after meal blood sugar each day, keep a record of those to review with me at follow-up visit in 1 week.  Okay to use sliding scale for now as needed, but keep a record of how many units of insulin you are using within each day.  Our staff should be scheduling a lab visit with you in the next few days.  Follow-up with me in 1 week for a telemedicine visit.  Return to the clinic or go to the nearest emergency room if any of your symptoms worsen or new symptoms occur.  I will check testosterone levels, but again would want to make sure that urology is involved before we restart any treatment.  Take care.       I discussed the assessment and treatment plan with the patient. The patient was provided an opportunity to ask questions and all were answered. The patient agreed with the plan and demonstrated an understanding of the instructions.   The patient was advised to call back or seek an in-person evaluation if the symptoms worsen or if the condition fails to improve as anticipated.  I provided 10 minutes of non-face-to-face time during this encounter.  Signed,   [name], MD [facility].  06/17/18

## 2018-06-17 NOTE — Addendum Note (Signed)
Addended by: Raymon Mutton on: 06/17/2018 11:18 AM   Modules accepted: Orders

## 2018-06-17 NOTE — Progress Notes (Signed)
Pt c/o of diabetes follow up.

## 2018-06-17 NOTE — Patient Instructions (Signed)
Good talking to you today. Sorry to here the blood sugars have gone up, but I think you may be right - could be due to infection as well as diet.  Check fasting blood sugar and then a 2-hour postprandial or after meal blood sugar each day, keep a record of those to review with me at follow-up visit in 1 week.  Okay to use sliding scale for now as needed, but keep a record of how many units of insulin you are using within each day.  Our staff should be scheduling a lab visit with you in the next few days.  Follow-up with me in 1 week for a telemedicine visit.  Return to the clinic or go to the nearest emergency room if any of your symptoms worsen or new symptoms occur.  I will check testosterone levels, but again would want to make sure that urology is involved before we restart any treatment.  Take care.

## 2018-06-18 LAB — COMPREHENSIVE METABOLIC PANEL
ALT: 26 IU/L (ref 0–44)
AST: 20 IU/L (ref 0–40)
Albumin/Globulin Ratio: 2.3 — ABNORMAL HIGH (ref 1.2–2.2)
Albumin: 4.5 g/dL (ref 3.8–4.8)
Alkaline Phosphatase: 67 IU/L (ref 39–117)
BUN/Creatinine Ratio: 14 (ref 10–24)
BUN: 15 mg/dL (ref 8–27)
Bilirubin Total: 0.4 mg/dL (ref 0.0–1.2)
CO2: 21 mmol/L (ref 20–29)
Calcium: 9.5 mg/dL (ref 8.6–10.2)
Chloride: 98 mmol/L (ref 96–106)
Creatinine, Ser: 1.1 mg/dL (ref 0.76–1.27)
GFR calc Af Amer: 83 mL/min/{1.73_m2} (ref >59)
GFR calc non Af Amer: 72 mL/min/{1.73_m2} (ref >59)
Globulin, Total: 2 g/dL (ref 1.5–4.5)
Glucose: 240 mg/dL — ABNORMAL HIGH (ref 65–99)
Potassium: 4.1 mmol/L (ref 3.5–5.2)
Sodium: 137 mmol/L (ref 134–144)
Total Protein: 6.5 g/dL (ref 6.0–8.5)

## 2018-06-18 LAB — TESTOSTERONE, FREE, TOTAL, SHBG
Sex Hormone Binding: 34.4 nmol/L (ref 19.3–76.4)
Testosterone, Free: 5.5 pg/mL — ABNORMAL LOW (ref 6.6–18.1)
Testosterone: 309 ng/dL (ref 264–916)

## 2018-06-18 LAB — HEMOGLOBIN A1C
Est. average glucose Bld gHb Est-mCnc: 237 mg/dL
Hgb A1c MFr Bld: 9.9 % — ABNORMAL HIGH (ref 4.8–5.6)

## 2018-06-18 LAB — LIPID PANEL
Chol/HDL Ratio: 2.5 ratio (ref 0.0–5.0)
Cholesterol, Total: 129 mg/dL (ref 100–199)
HDL: 51 mg/dL (ref >39)
LDL Calculated: 20 mg/dL (ref 0–99)
Triglycerides: 290 mg/dL — ABNORMAL HIGH (ref 0–149)
VLDL Cholesterol Cal: 58 mg/dL — ABNORMAL HIGH (ref 5–40)

## 2018-06-24 ENCOUNTER — Telehealth (INDEPENDENT_AMBULATORY_CARE_PROVIDER_SITE_OTHER): Payer: BLUE CROSS/BLUE SHIELD | Admitting: Family Medicine

## 2018-06-24 ENCOUNTER — Other Ambulatory Visit: Payer: Self-pay

## 2018-06-24 DIAGNOSIS — E1165 Type 2 diabetes mellitus with hyperglycemia: Secondary | ICD-10-CM

## 2018-06-24 MED ORDER — BASAGLAR KWIKPEN 100 UNIT/ML ~~LOC~~ SOPN: 5.0000 [IU] | PEN_INJECTOR | Freq: Every day | SUBCUTANEOUS | 2 refills | Status: DC

## 2018-06-24 MED ORDER — PEN NEEDLES 32G X 4 MM MISC: 1.0000 "application " | Freq: Every day | 3 refills | Status: DC

## 2018-06-24 NOTE — Addendum Note (Signed)
Addended by: [name] R on: 06/24/2018 01:59 PM   Modules accepted: Orders

## 2018-06-24 NOTE — Patient Instructions (Addendum)
Based on home blood sugar readings, I think a basal insulin will be the best initial approach at this time.  Continue Trulicity same dose.  Start basal Klar 5 units each night initially.  Monitor your home blood sugar readings both fasting and 2 hours after meals.  2 readings per day should be sufficient.  If fasting blood sugars remain over 200 in 2 days, increase the Basaglar by 2 units (7 units at that time).  Can continue to increase the dose by 2 units every 2 days until fasting blood sugars remain below 200.  Watch for symptomatic low blood sugars, do not skip meals, see information below on warning signs for low blood sugars.   Follow-up in 2 weeks for telemedicine visit, but happy to speak to you before that time if needed.  Take care.     Insulin Injection Instructions, Using Insulin Pens, Adult A subcutaneous injection is a shot of medicine that is injected into the layer of fat and tissue between skin and muscle. People with type 1 diabetes must take insulin because their bodies do not make it. People with type 2 diabetes may need to take insulin. There are many different types of insulin. The type of insulin that you take may determine how many injections you give yourself and when you need to give the injections. Supplies needed:  Soap and water to wash hands.  Your insulin pen.  A new, unused needle.  Alcohol wipes.  A disposal container that is meant for sharp items (sharps container), such as an empty plastic bottle with a cover. How to choose a site for injection The body absorbs insulin differently, depending on where the insulin is injected (injection site). It is best to inject insulin into the same body area each time (for example, always in the abdomen), but you should use a different spot in that area for each injection. Do not inject the insulin in the same spot each time. There are five main areas that can be used for injecting. These areas include:  Abdomen. This is  the preferred area.  Front of thigh.  Upper, outer side of thigh.  Upper, outer side of arm.  Upper, outer part of buttock. How to use an insulin pen  First, follow the steps for Get ready, then continue with the steps for Inject the insulin. Get ready 1. Wash your hands with soap and water. If soap and water are not available, use hand sanitizer. 2. Before you give yourself an insulin injection, be sure to test your blood sugar level (blood glucose level) and write down that number. Follow any instructions from your health care provider about what to do if your blood glucose level is higher or lower than your normal range. 3. Check the expiration date and the type of insulin that is in the pen. 4. If you are using CLEAR insulin, check to see that it is clear and free of clumps. 5. If you are using CLOUDY insulin, do not shake the pen to get the injection ready. Instead, get it ready in one of these ways: ? Gently roll the pen between your palms several times. ? Tip the pen up and down several times. 6. Remove the cap from the insulin pen. 7. Use an alcohol wipe to clean the rubber tip of the pen. 8. Remove the protective paper tab from the disposable needle. Do not let the needle touch anything. 9. Screw a new, unused needle onto the pen. 10. Remove the  outer plastic needle cover. Do not throw away the outer plastic cover yet. ? If the pen uses a special safety needle, leave the inner needle shield in place. ? If the pen does not use a special safety needle, remove the inner plastic cover from the needle. 11. Follow the manufacturer's instructions to prime the insulin pen with the volume of insulin needed. Hold the pen with the needle pointing up, and push the button on the opposite end of the pen until a drop of insulin appears at the needle tip. If no insulin appears, repeat this step. 12. Turn the button (dial) to the number of units of insulin that you will be injecting. Inject the  insulin 1. Use an alcohol wipe to clean the site where you will be injecting the needle. Let the site air-dry. 2. Hold the pen in the palm of your writing hand like a pencil. 3. If directed by your health care provider, use your other hand to pinch and hold about an inch (2.5 cm) of skin at the injection site. Do not directly touch the cleaned part of the skin. 4. Gently but quickly, use your writing hand to put the needle straight into the skin. The needle should be at a 90-degree angle (perpendicular) to the skin. 5. When the needle is completely inserted into the skin, use your thumb or index finger of your writing hand to push the top button of the pen down all the way to inject the insulin. 6. Let go of the skin that you are pinching. Continue to hold the pen in place with your writing hand. 7. Wait 10 seconds, then pull the needle straight out of the skin. This will allow all of the insulin to go from the pen and needle into your body. 8. Carefully put the larger (outer) plastic cover of the needle back over the needle, then unscrew the capped needle and discard it in a sharps container, such as an empty plastic bottle with a cover. 9. Put the plastic cap back on the insulin pen. How to throw away supplies  Discard all used needles in a puncture-proof sharps disposal container. You can ask your local pharmacy about where you can get this kind of disposal container, or you can use an empty plastic liquid laundry detergent bottle that has a cover.  Follow the disposal regulations for the area where you live. Do not use any needle more than one time.  Throw away empty disposable pens in the regular trash. Questions to ask your health care provider  How often should I be taking insulin?  How often should I check my blood glucose?  What amount of insulin should I be taking at each time?  What are the side effects?  What should I do if my blood glucose is too high?  What should I do if  my blood glucose is too low?  What should I do if I forget to take my insulin?  What number should I call if I have questions? Where to find more information  American Diabetes Association (ADA): www.diabetes.org  American Association of Diabetes Educators (AADE) Patient Resources: https://www.diabeteseducator.org Summary  A subcutaneous injection is a shot of medicine that is injected into the layer of fat and tissue between skin and muscle.  Before you give yourself an insulin injection, be sure to test your blood sugar level (blood glucose level) and write down that number.  Check the expiration date and the type of insulin that is  in the pen. The type of insulin that you take may determine how many injections you give yourself and when you need to give the injections.  It is best to inject insulin into the same body area each time (for example, always in the abdomen), but you should use a different spot in that area for each injection. This information is not intended to replace advice given to you by your health care provider. Make sure you discuss any questions you have with your health care provider. Document Released: 03/24/2015 Document Revised: 03/10/2017 Document Reviewed: 03/24/2015 Elsevier Interactive Patient Education  2019 [employer]    Hypoglycemia Hypoglycemia is when the sugar (glucose) level in your blood is too low. Signs of low blood sugar may include:  Feeling: ? Hungry. ? Worried or nervous (anxious). ? Sweaty and clammy. ? Confused. ? Dizzy. ? Sleepy. ? Sick to your stomach (nauseous).  Having: ? A fast heartbeat. ? A headache. ? A change in your vision. ? Tingling or no feeling (numbness) around your mouth, lips, or tongue. ? Jerky movements that you cannot control (seizure).  Having trouble with: ? Moving (coordination). ? Sleeping. ? Passing out (fainting). ? Getting upset easily (irritability). Low blood sugar can happen to people who  have diabetes and people who do not have diabetes. Low blood sugar can happen quickly, and it can be an emergency. Treating low blood sugar Low blood sugar is often treated by eating or drinking something sugary right away, such as:  Fruit juice, 4-6 oz (120-150 mL).  Regular soda (not diet soda), 4-6 oz (120-150 mL).  Low-fat milk, 4 oz (120 mL).  Several pieces of hard candy.  Sugar or honey, 1 Tbsp (15 mL). Treating low blood sugar if you have diabetes If you can think clearly and swallow safely, follow the 15:15 rule:  Take 15 grams of a fast-acting carb (carbohydrate). Talk with your doctor about how much you should take.  Always keep a source of fast-acting carb with you, such as: ? Sugar tablets (glucose pills). Take 3-4 pills. ? 6-8 pieces of hard candy. ? 4-6 oz (120-150 mL) of fruit juice. ? 4-6 oz (120-150 mL) of regular (not diet) soda. ? 1 Tbsp (15 mL) honey or sugar.  Check your blood sugar 15 minutes after you take the carb.  If your blood sugar is still at or below 70 mg/dL (3.9 mmol/L), take 15 grams of a carb again.  If your blood sugar does not go above 70 mg/dL (3.9 mmol/L) after 3 tries, get help right away.  After your blood sugar goes back to normal, eat a meal or a snack within 1 hour.  Treating very low blood sugar If your blood sugar is at or below 54 mg/dL (3 mmol/L), you have very low blood sugar (severe hypoglycemia). This may also cause:  Passing out.  Jerky movements you cannot control (seizure).  Losing consciousness (coma). This is an emergency. Do not wait to see if the symptoms will go away. Get medical help right away. Call your local emergency services (911 in the [place] Do not drive yourself to the hospital. If you have very low blood sugar and you cannot eat or drink, you may need a glucagon shot (injection). A family member or friend should learn how to check your blood sugar and how to give you a glucagon shot. Ask your doctor if you  need to have a glucagon shot kit at home. Follow these instructions at home: General instructions  Take over-the-counter and prescription medicines only as told by your doctor.  Stay aware of your blood sugar as told by your doctor.  Limit alcohol intake to no more than 1 drink a day for nonpregnant women and 2 drinks a day for men. One drink equals 12 oz of beer (355 mL), 5 oz of wine (148 mL), or 1 oz of hard liquor (44 mL).  Keep all follow-up visits as told by your doctor. This is important. If you have diabetes:   Follow your diabetes care plan as told by your doctor. Make sure you: ? Know the signs of low blood sugar. ? Take your medicines as told. ? Follow your exercise and meal plan. ? Eat on time. Do not skip meals. ? Check your blood sugar as often as told by your doctor. Always check it before and after exercise. ? Follow your sick day plan when you cannot eat or drink normally. Make this plan ahead of time with your doctor.  Share your diabetes care plan with: ? Your work or school. ? People you live with.  Check your pee (urine) for ketones: ? When you are sick. ? As told by your doctor.  Carry a card or wear jewelry that says you have diabetes. Contact a doctor if:  You have trouble keeping your blood sugar in your target range.  You have low blood sugar often. Get help right away if:  You still have symptoms after you eat or drink something sugary.  Your blood sugar is at or below 54 mg/dL (3 mmol/L).  You have jerky movements that you cannot control.  You pass out. These symptoms may be an emergency. Do not wait to see if the symptoms will go away. Get medical help right away. Call your local emergency services (911 in the [place] Do not drive yourself to the hospital. Summary  Hypoglycemia happens when the level of sugar (glucose) in your blood is too low.  Low blood sugar can happen to people who have diabetes and people who do not have diabetes.  Low blood sugar can happen quickly, and it can be an emergency.  Make sure you know the signs of low blood sugar and know how to treat it.  Always keep a source of sugar (fast-acting carb) with you to treat low blood sugar. This information is not intended to replace advice given to you by your health care provider. Make sure you discuss any questions you have with your health care provider. Document Released: 05/15/2009 Document Revised: 08/12/2017 Document Reviewed: 03/24/2015 Elsevier Interactive Patient Education  [employer].   If you have lab work done today you will be contacted with your lab results within the next 2 weeks.  If you have not heard from [place] then please contact us. The fastest way to get your results is to register for My Chart.   IF you received an x-ray today, you will receive an invoice from [facility]. Please contact [facility] at [phone] with questions or concerns regarding your invoice.   IF you received labwork today, you will receive an invoice from [place]. Please contact [facility] at [phone] with questions or concerns regarding your invoice.   Our billing staff will not be able to assist you with questions regarding bills from these companies.  You will be contacted with the lab results as soon as they are available. The fastest way to get your results is to activate your My Chart account. Instructions are located on  the last page of this paperwork. If you have not heard from [place] regarding the results in 2 weeks, please contact this office.

## 2018-06-24 NOTE — Progress Notes (Signed)
Virtual Visit via Telephone Note  I connected with Francis Cross on 06/24/18 at 8:57 AM by telephone and verified that I am speaking with the correct person using two identifiers.   I discussed the limitations, risks, security and privacy concerns of performing an evaluation and management service by telephone and the availability of in person appointments. I also discussed with the patient that there may be a patient responsible charge related to this service. The patient expressed understanding and agreed to proceed, consent obtained  Chief complaint:  diabetes  History of Present Illness: Diabetes:  Discussed 1 week ago.  On Trulicity 1.5 mg weekly at that time, but was using sliding scale few times over the prior week.  Did have glucose in the 300s previous week thought related to diet.  Lowest 140 when working in the yard, 207 that morning.  Thought to have some impact on readings from recent prostate infection and back injections a months prior.  Asked to chart readings and insulin need closely for visit today. Home readings per triage note:  Fastings: 227, 215, 226, 247, 200, 209, 200 Afternoon/evenings: 221, 271, 280, 233, 236, 148(at 6 PM on the 18th- before dinner, working outside all day), 180, 278, 230, 265 - reports these were random, not necessarily 2 hr postprandial.   Has not used any sliding scale.  No symptomatic lows.   Lab Results  Component Value Date   HGBA1C 9.9 (H) 06/17/2018   HGBA1C 7.0 (H) 01/12/2018   HGBA1C 9.2 (H) 09/23/2017   Lab Results  Component Value Date   MICROALBUR 0.5 09/21/2015   LDLCALC 20 06/17/2018   CREATININE 1.10 06/17/2018      Patient Active Problem List   Diagnosis Date Noted   Herniated nucleus pulposus, L4-5 left 09/23/2017   Hx of carpal tunnel repair 12/22/2015   Testosterone deficiency 05/23/2014   Pain in the chest 10/01/2013   GOUT, UNSPECIFIED 01/29/2010   Obesity 12/06/2008   IRON OVERLOAD 08/11/2007    HIATAL HERNIA WITH REFLUX 07/08/2006   Hypercholesteremia 06/12/2006   HYPERTENSION 06/12/2006   ERECTILE DYSFUNCTION, 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   Past Medical History:  Diagnosis Date   Acid reflux    Cancer ([place])    skin cancer- basal cell on nose   Diabetes mellitus without complication ([place])    Difficult intubation    Erectile dysfunction    Fatty liver    Gout    Hemochromatosis    Hiatal hernia    History of kidney stones    Hyperlipidemia    Hypertension    Iron overload    Obesity    Past Surgical History:  Procedure Laterality Date   APPENDECTOMY     BACK SURGERY  09/09/2017   lumbar discectomy    CARDIAC CATHETERIZATION     [facility]   CERVICAL FUSION     KIDNEY STONE SURGERY  03/07/2017   laser blast   LUMBAR LAMINECTOMY/DECOMPRESSION MICRODISCECTOMY N/A 09/23/2017   Procedure: Lumbar Four-Five Redo Discectomy for epidural hematoma.;  Surgeon: Kristeen Miss, MD;  Location: [facility];  Service: Neurosurgery;  Laterality: N/A;   TONSILLECTOMY     Allergies  Allergen Reactions   Cephalexin Rash   Penicillins Itching, Rash and Other (See Comments)    Has patient had a PCN reaction causing immediate rash, facial/tongue/throat swelling, SOB or lightheadedness with hypotension: No Has patient had a PCN reaction causing severe rash involving mucus membranes or skin necrosis: No Has patient had a PCN reaction that required hospitalization:  No Has patient had a PCN reaction occurring within the last 10 years: No If all of the above answers are "NO", then may proceed with Cephalosporin use.    Prior to Admission medications   Medication Sig Start Date End Date Taking? Authorizing Provider  cyclobenzaprine (FLEXERIL) 10 MG tablet Take 10 mg by mouth every 8 (eight) hours as needed for muscle spasms. 09/18/17  Yes [provider]  Dulaglutide (TRULICITY) 1.5 WE/9.9BZ SOPN INJECT 1.5 MG INTO THE SKIN ONCE A WEEK. 01/17/18  Yes [name], MD    ibuprofen (ADVIL,MOTRIN) 200 MG tablet Take 400 mg by mouth every 6 (six) hours as needed for headache or moderate pain.   Yes [provider]  ketoconazole (NIZORAL) 2 % shampoo APPLY TOPICALLY 2 (TWO) TIMES A WEEK. 04/11/16  Yes Shawnee Knapp, MD  ketorolac (ACULAR) 0.5 % ophthalmic solution Place 1 drop into both eyes 2 (two) times daily as needed. 04/11/16  Yes Shawnee Knapp, MD  losartan-hydrochlorothiazide (HYZAAR) 50-12.5 MG tablet Take 1 tablet by mouth daily. 06/12/18  Yes Wellington Hampshire, MD  omeprazole (PRILOSEC) 20 MG capsule Take 1 capsule (20 mg total) by mouth daily. 04/24/16  Yes Danis, Kirke Corin, MD  oxyCODONE-acetaminophen (PERCOCET/ROXICET) 5-325 MG tablet Take 1-2 tablets by mouth every 4 (four) hours as needed for severe pain. 09/18/17  Yes [provider]  Pioneer 420 MG/3.5ML SOCT Inject 420 mg as directed every 30 (thirty) days. 06/04/18  Yes Wellington Hampshire, MD  tamsulosin (FLOMAX) 0.4 MG CAPS capsule Take 0.4 mg by mouth.   Yes [provider]  UNABLE TO FIND TESTOSTERONE LIPODERM 5% (50 MG/ML) CREAM. Apply 2 ml (8 clicks) topically once a day as directed. 03/31/17  Yes [name], MD   Social History   Socioeconomic History   Marital status: Married    Spouse name: Not on file   Number of children: 3   Years of education: Not on file   Highest education level: Not on file  Occupational History   Occupation: retired  Scientist, product/process development strain: Not on file   Food insecurity:    Worry: Not on file    Inability: Not on file   Transportation needs:    Medical: Not on file    Non-medical: Not on file  Tobacco Use   Smoking status: Former Smoker   Smokeless tobacco: Former Network engineer and Sexual Activity   Alcohol use: Not Currently   Drug use: No   Sexual activity: Not on file  Lifestyle   Physical activity:    Days per week: Not on file    Minutes per session: Not on file    Stress: Not on file  Relationships   Social connections:    Talks on phone: Not on file    Gets together: Not on file    Attends religious service: Not on file    Active member of club or organization: Not on file    Attends meetings of clubs or organizations: Not on file    Relationship status: Not on file   Intimate partner violence:    Fear of current or ex partner: Not on file    Emotionally abused: Not on file    Physically abused: Not on file    Forced sexual activity: Not on file  Other Topics Concern   Not on file  Social History Narrative   Not on file  Observations/Objective: Normal speech on phone, no distress.  Normal responses.  Assessment and Plan: Type 2 diabetes mellitus with hyperglycemia, without long-term current use of insulin (HCC) - Plan: Insulin Glargine (BASAGLAR KWIKPEN) 100 UNIT/ML SOPN  -Uncontrolled with hyperglycemia, noted elevated fastings in addition to randoms.    -Start with Basaglar 5 units daily, then increase by 2 units every 2 days until fastings below 200.    Continue Trulicity for now.   - Has sliding scale but will approach with basal insulin first.   - Hypoglycemia precautions discussed.   - Recheck in 2 weeks.  Monitor fastings and 2-hour postprandial in the meantime.    -Advised to not skip meals, and if working outside for extended periods of time may need a snack.  Follow Up Instructions: 2 weeks.    Patient Instructions   Based on home blood sugar readings, I think a basal insulin will be the best initial approach at this time.  Continue Trulicity same dose.  Start basal Klar 5 units each night initially.  Monitor your home blood sugar readings both fasting and 2 hours after meals.  2 readings per day should be sufficient.  If fasting blood sugars remain over 200 in 2 days, increase the Basaglar by 2 units (7 units at that time).  Can continue to increase the dose by 2 units every 2 days until fasting blood sugars remain  below 200.  Watch for symptomatic low blood sugars, do not skip meals, see information below on warning signs for low blood sugars.   Follow-up in 2 weeks for telemedicine visit, but happy to speak to you before that time if needed.  Take care.     Insulin Injection Instructions, Using Insulin Pens, Adult A subcutaneous injection is a shot of medicine that is injected into the layer of fat and tissue between skin and muscle. People with type 1 diabetes must take insulin because their bodies do not make it. People with type 2 diabetes may need to take insulin. There are many different types of insulin. The type of insulin that you take may determine how many injections you give yourself and when you need to give the injections. Supplies needed:  Soap and water to wash hands.  Your insulin pen.  A new, unused needle.  Alcohol wipes.  A disposal container that is meant for sharp items (sharps container), such as an empty plastic bottle with a cover. How to choose a site for injection The body absorbs insulin differently, depending on where the insulin is injected (injection site). It is best to inject insulin into the same body area each time (for example, always in the abdomen), but you should use a different spot in that area for each injection. Do not inject the insulin in the same spot each time. There are five main areas that can be used for injecting. These areas include:  Abdomen. This is the preferred area.  Front of thigh.  Upper, outer side of thigh.  Upper, outer side of arm.  Upper, outer part of buttock. How to use an insulin pen  First, follow the steps for Get ready, then continue with the steps for Inject the insulin. Get ready 1. Wash your hands with soap and water. If soap and water are not available, use hand sanitizer. 2. Before you give yourself an insulin injection, be sure to test your blood sugar level (blood glucose level) and write down that number. Follow  any instructions from your health care provider  about what to do if your blood glucose level is higher or lower than your normal range. 3. Check the expiration date and the type of insulin that is in the pen. 4. If you are using CLEAR insulin, check to see that it is clear and free of clumps. 5. If you are using CLOUDY insulin, do not shake the pen to get the injection ready. Instead, get it ready in one of these ways: ? Gently roll the pen between your palms several times. ? Tip the pen up and down several times. 6. Remove the cap from the insulin pen. 7. Use an alcohol wipe to clean the rubber tip of the pen. 8. Remove the protective paper tab from the disposable needle. Do not let the needle touch anything. 9. Screw a new, unused needle onto the pen. 10. Remove the outer plastic needle cover. Do not throw away the outer plastic cover yet. ? If the pen uses a special safety needle, leave the inner needle shield in place. ? If the pen does not use a special safety needle, remove the inner plastic cover from the needle. 11. Follow the manufacturer's instructions to prime the insulin pen with the volume of insulin needed. Hold the pen with the needle pointing up, and push the button on the opposite end of the pen until a drop of insulin appears at the needle tip. If no insulin appears, repeat this step. 12. Turn the button (dial) to the number of units of insulin that you will be injecting. Inject the insulin 1. Use an alcohol wipe to clean the site where you will be injecting the needle. Let the site air-dry. 2. Hold the pen in the palm of your writing hand like a pencil. 3. If directed by your health care provider, use your other hand to pinch and hold about an inch (2.5 cm) of skin at the injection site. Do not directly touch the cleaned part of the skin. 4. Gently but quickly, use your writing hand to put the needle straight into the skin. The needle should be at a 90-degree angle  (perpendicular) to the skin. 5. When the needle is completely inserted into the skin, use your thumb or index finger of your writing hand to push the top button of the pen down all the way to inject the insulin. 6. Let go of the skin that you are pinching. Continue to hold the pen in place with your writing hand. 7. Wait 10 seconds, then pull the needle straight out of the skin. This will allow all of the insulin to go from the pen and needle into your body. 8. Carefully put the larger (outer) plastic cover of the needle back over the needle, then unscrew the capped needle and discard it in a sharps container, such as an empty plastic bottle with a cover. 9. Put the plastic cap back on the insulin pen. How to throw away supplies  Discard all used needles in a puncture-proof sharps disposal container. You can ask your local pharmacy about where you can get this kind of disposal container, or you can use an empty plastic liquid laundry detergent bottle that has a cover.  Follow the disposal regulations for the area where you live. Do not use any needle more than one time.  Throw away empty disposable pens in the regular trash. Questions to ask your health care provider  How often should I be taking insulin?  How often should I check my blood glucose?  What amount of insulin should I be taking at each time?  What are the side effects?  What should I do if my blood glucose is too high?  What should I do if my blood glucose is too low?  What should I do if I forget to take my insulin?  What number should I call if I have questions? Where to find more information  American Diabetes Association (ADA): www.diabetes.org  American Association of Diabetes Educators (AADE) Patient Resources: https://www.diabeteseducator.org Summary  A subcutaneous injection is a shot of medicine that is injected into the layer of fat and tissue between skin and muscle.  Before you give yourself an insulin  injection, be sure to test your blood sugar level (blood glucose level) and write down that number.  Check the expiration date and the type of insulin that is in the pen. The type of insulin that you take may determine how many injections you give yourself and when you need to give the injections.  It is best to inject insulin into the same body area each time (for example, always in the abdomen), but you should use a different spot in that area for each injection. This information is not intended to replace advice given to you by your health care provider. Make sure you discuss any questions you have with your health care provider. Document Released: 03/24/2015 Document Revised: 03/10/2017 Document Reviewed: 03/24/2015 Elsevier Interactive Patient Education  2019 Elsevier Inc.    Hypoglycemia Hypoglycemia is when the sugar (glucose) level in your blood is too low. Signs of low blood sugar may include:  Feeling: ? Hungry. ? Worried or nervous (anxious). ? Sweaty and clammy. ? Confused. ? Dizzy. ? Sleepy. ? Sick to your stomach (nauseous).  Having: ? A fast heartbeat. ? A headache. ? A change in your vision. ? Tingling or no feeling (numbness) around your mouth, lips, or tongue. ? Jerky movements that you cannot control (seizure).  Having trouble with: ? Moving (coordination). ? Sleeping. ? Passing out (fainting). ? Getting upset easily (irritability). Low blood sugar can happen to people who have diabetes and people who do not have diabetes. Low blood sugar can happen quickly, and it can be an emergency. Treating low blood sugar Low blood sugar is often treated by eating or drinking something sugary right away, such as:  Fruit juice, 4-6 oz (120-150 mL).  Regular soda (not diet soda), 4-6 oz (120-150 mL).  Low-fat milk, 4 oz (120 mL).  Several pieces of hard candy.  Sugar or honey, 1 Tbsp (15 mL). Treating low blood sugar if you have diabetes If you can think clearly  and swallow safely, follow the 15:15 rule:  Take 15 grams of a fast-acting carb (carbohydrate). Talk with your doctor about how much you should take.  Always keep a source of fast-acting carb with you, such as: ? Sugar tablets (glucose pills). Take 3-4 pills. ? 6-8 pieces of hard candy. ? 4-6 oz (120-150 mL) of fruit juice. ? 4-6 oz (120-150 mL) of regular (not diet) soda. ? 1 Tbsp (15 mL) honey or sugar.  Check your blood sugar 15 minutes after you take the carb.  If your blood sugar is still at or below 70 mg/dL (3.9 mmol/L), take 15 grams of a carb again.  If your blood sugar does not go above 70 mg/dL (3.9 mmol/L) after 3 tries, get help right away.  After your blood sugar goes back to normal, eat a meal or a snack within  1 hour.  Treating very low blood sugar If your blood sugar is at or below 54 mg/dL (3 mmol/L), you have very low blood sugar (severe hypoglycemia). This may also cause:  Passing out.  Jerky movements you cannot control (seizure).  Losing consciousness (coma). This is an emergency. Do not wait to see if the symptoms will go away. Get medical help right away. Call your local emergency services (911 in the [place] Do not drive yourself to the hospital. If you have very low blood sugar and you cannot eat or drink, you may need a glucagon shot (injection). A family member or friend should learn how to check your blood sugar and how to give you a glucagon shot. Ask your doctor if you need to have a glucagon shot kit at home. Follow these instructions at home: General instructions  Take over-the-counter and prescription medicines only as told by your doctor.  Stay aware of your blood sugar as told by your doctor.  Limit alcohol intake to no more than 1 drink a day for nonpregnant women and 2 drinks a day for men. One drink equals 12 oz of beer (355 mL), 5 oz of wine (148 mL), or 1 oz of hard liquor (44 mL).  Keep all follow-up visits as told by your doctor. This  is important. If you have diabetes:   Follow your diabetes care plan as told by your doctor. Make sure you: ? Know the signs of low blood sugar. ? Take your medicines as told. ? Follow your exercise and meal plan. ? Eat on time. Do not skip meals. ? Check your blood sugar as often as told by your doctor. Always check it before and after exercise. ? Follow your sick day plan when you cannot eat or drink normally. Make this plan ahead of time with your doctor.  Share your diabetes care plan with: ? Your work or school. ? People you live with.  Check your pee (urine) for ketones: ? When you are sick. ? As told by your doctor.  Carry a card or wear jewelry that says you have diabetes. Contact a doctor if:  You have trouble keeping your blood sugar in your target range.  You have low blood sugar often. Get help right away if:  You still have symptoms after you eat or drink something sugary.  Your blood sugar is at or below 54 mg/dL (3 mmol/L).  You have jerky movements that you cannot control.  You pass out. These symptoms may be an emergency. Do not wait to see if the symptoms will go away. Get medical help right away. Call your local emergency services (911 in the [place] Do not drive yourself to the hospital. Summary  Hypoglycemia happens when the level of sugar (glucose) in your blood is too low.  Low blood sugar can happen to people who have diabetes and people who do not have diabetes. Low blood sugar can happen quickly, and it can be an emergency.  Make sure you know the signs of low blood sugar and know how to treat it.  Always keep a source of sugar (fast-acting carb) with you to treat low blood sugar. This information is not intended to replace advice given to you by your health care provider. Make sure you discuss any questions you have with your health care provider. Document Released: 05/15/2009 Document Revised: 08/12/2017 Document Reviewed: 03/24/2015 Elsevier  Interactive Patient Education  [employer].   If you have lab work done  today you will be contacted with your lab results within the next 2 weeks.  If you have not heard from [place] then please contact us. The fastest way to get your results is to register for My Chart.   IF you received an x-ray today, you will receive an invoice from [facility]. Please contact [facility] at [phone] with questions or concerns regarding your invoice.   IF you received labwork today, you will receive an invoice from [facility]. Please contact [facility] at [phone] with questions or concerns regarding your invoice.   Our billing staff will not be able to assist you with questions regarding bills from these companies.  You will be contacted with the lab results as soon as they are available. The fastest way to get your results is to activate your My Chart account. Instructions are located on the last page of this paperwork. If you have not heard from [place] regarding the results in 2 weeks, please contact this office.          I discussed the assessment and treatment plan with the patient. The patient was provided an opportunity to ask questions and all were answered. The patient agreed with the plan and demonstrated an understanding of the instructions.   The patient was advised to call back or seek an in-person evaluation if the symptoms worsen or if the condition fails to improve as anticipated.  I provided 11 minutes of non-face-to-face time during this encounter.  Signed,   [name], MD [facility] [place].  06/24/18

## 2018-06-24 NOTE — Progress Notes (Signed)
CC- 1wk f/u for diabetes-Patient blood sugar has been running in the 200 for the past week. Patient have not been taking insulin. Blood sugar readings is as follow  06/18/18 7am-227, 4:30 pm-221, 8:00pm-271 06/19/18 8:15am-215, 12:30pm 280, 4:00pm-233, and at 5:30pm-236 06/20/18 8:30am-226, 6:00pm-148 06/21/18 9:00am -247, 1:30pm-180, and at 10:45pm-278 06/22/18 8:00am-200, 8:00pm-230 06/23/18 9:00am-209, 9:30pm-265 06/24/18 8:00am-200

## 2018-07-04 ENCOUNTER — Encounter: Payer: Self-pay | Admitting: Family Medicine

## 2018-07-08 ENCOUNTER — Telehealth (INDEPENDENT_AMBULATORY_CARE_PROVIDER_SITE_OTHER): Payer: BLUE CROSS/BLUE SHIELD | Admitting: Family Medicine

## 2018-07-08 ENCOUNTER — Other Ambulatory Visit: Payer: Self-pay

## 2018-07-08 DIAGNOSIS — E1165 Type 2 diabetes mellitus with hyperglycemia: Secondary | ICD-10-CM

## 2018-07-08 MED ORDER — BASAGLAR KWIKPEN 100 UNIT/ML ~~LOC~~ SOPN: 12.0000 [IU] | PEN_INJECTOR | Freq: Every day | SUBCUTANEOUS | 1 refills | Status: DC

## 2018-07-08 NOTE — Patient Instructions (Addendum)
  Continue Basaglar 12 units per night and same dose of trulicity.  Continue to check blood sugars fasting and 2 hours after meals.  Additionally can check blood sugar anytime you feel it may be running low and if you do have symptomatic low blood sugars, let me know right away so we can adjust your medication.  Follow-up in the next 4 weeks, but I am happy to discuss medication sooner if needed. You can also send me an update in the next couple weeks by MyChart if you would like.  Please let me know if there are questions and take care.   If you have lab work done today you will be contacted with your lab results within the next 2 weeks.  If you have not heard from [place] then please contact us. The fastest way to get your results is to register for My Chart.   IF you received an x-ray today, you will receive an invoice from [facility]. Please contact [facility] at [phone] with questions or concerns regarding your invoice.   IF you received labwork today, you will receive an invoice from [facility]. Please contact [facility] at [phone] with questions or concerns regarding your invoice.   Our billing staff will not be able to assist you with questions regarding bills from these companies.  You will be contacted with the lab results as soon as they are available. The fastest way to get your results is to activate your My Chart account. Instructions are located on the last page of this paperwork. If you have not heard from [place] regarding the results in 2 weeks, please contact this office.

## 2018-07-08 NOTE — Progress Notes (Signed)
CC-2 week f/u Diabetes-patient stated he is doing fine his blood sugar this morning was 169. Yesterday blood sugar was 148 in the am and 120 in the pm. Nothing has changed since his last visit. Based on last visit patient started basalglar 5 units. And was told if blood sugar kept reading over 200 would increase Basalglar by 2 units.

## 2018-07-08 NOTE — Progress Notes (Signed)
Virtual Visit via Telephone Note  I connected with Francis Cross on 07/08/18 at 9:05 AM by telephone and verified that I am speaking with the correct person using two identifiers.   I discussed the limitations, risks, security and privacy concerns of performing an evaluation and management service by telephone and the availability of in person appointments. I also discussed with the patient that there may be a patient responsible charge related to this service. The patient expressed understanding and agreed to proceed, consent obtained  Chief complaint:  Diabetes   History of Present Illness: Francis Cross is a 63 y.o. male  Diabetes: Uncontrolled, last discussed at telemedicine visit April 22.  Had previously been taking Trulicity 1.5 mg weekly, sliding scale insulin few times few weeks prior.  Home readings were in the 200s with rare 100s.  Started on basaglar 5 units/day initially with increasing dosing every 2 days until readings below 200.   Started basaglar about 2 weeks ago.  Has been increasing doses. No new side effects.  cbg this am 159, Fasting 141 yesterday morning.  121 yesterday afternoon before dinner - lowest reading. Had been working outside in garden.  No postprandial readings.  Took 5 units last night of basaglar due to concern  No symptomatic low blood sugars. Had been up to 12 units.   Lab Results  Component Value Date   HGBA1C 9.9 (H) 06/17/2018   HGBA1C 7.0 (H) 01/12/2018   HGBA1C 9.2 (H) 09/23/2017   Lab Results  Component Value Date   MICROALBUR 0.5 09/21/2015   LDLCALC 20 06/17/2018   CREATININE 1.10 06/17/2018      Patient Active Problem List   Diagnosis Date Noted  . Herniated nucleus pulposus, L4-5 left 09/23/2017  . Hx of carpal tunnel repair 12/22/2015  . Testosterone deficiency 05/23/2014  . Pain in the chest 10/01/2013  . GOUT, UNSPECIFIED 01/29/2010  . Obesity 12/06/2008  . IRON OVERLOAD 08/11/2007  . HIATAL HERNIA WITH REFLUX  07/08/2006  . Hypercholesteremia 06/12/2006  . HYPERTENSION 06/12/2006  . 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 Acid reflux   . Cancer ([place])    skin cancer- basal cell on nose  . Diabetes mellitus without complication ([place])   . Difficult intubation   . Erectile dysfunction   . Fatty liver   . Gout   . Hemochromatosis   . Hiatal hernia   . History of kidney stones   . Hyperlipidemia   . Hypertension   . Iron overload   . Obesity    Past Surgical History:  Procedure Laterality Date  . APPENDECTOMY    . BACK SURGERY  09/09/2017   lumbar discectomy   . CARDIAC CATHETERIZATION     [place]    . KIDNEY STONE SURGERY  03/07/2017   laser blast  . LUMBAR LAMINECTOMY/DECOMPRESSION MICRODISCECTOMY N/A 09/23/2017   Procedure: Lumbar Four-Five Redo Discectomy for epidural hematoma.;  Surgeon: Kristeen Miss, MD;  Location: [facility];  Service: Neurosurgery;  Laterality: N/A;  . TONSILLECTOMY     Allergies  Allergen Reactions  . Cephalexin Rash  . Penicillins Itching, Rash and Other (See Comments)    Has patient had a PCN reaction causing immediate rash, facial/tongue/throat swelling, SOB or lightheadedness with hypotension: No Has patient had a PCN reaction causing severe rash involving mucus membranes or skin necrosis: No Has patient had a PCN reaction that required hospitalization: No Has patient had a PCN reaction occurring  within the last 10 years: No If all of the above answers are "NO", then may proceed with Cephalosporin use.    Prior to Admission medications   Medication Sig Start Date End Date Taking? Authorizing Provider  cyclobenzaprine (FLEXERIL) 10 MG tablet Take 10 mg by mouth every 8 (eight) hours as needed for muscle spasms. 09/18/17  Yes [provider]  Dulaglutide (TRULICITY) 1.5 YB/0.1BP SOPN INJECT 1.5 MG INTO THE SKIN ONCE A WEEK. 01/17/18  Yes [name], MD  ibuprofen (ADVIL,MOTRIN) 200  MG tablet Take 400 mg by mouth every 6 (six) hours as needed for headache or moderate pain.   Yes [provider]  Insulin Glargine (BASAGLAR KWIKPEN) 100 UNIT/ML SOPN Inject 0.05 mLs (5 Units total) into the skin at bedtime. 06/24/18  Yes [name], MD  Insulin Pen Needle (PEN NEEDLES) 32G X 4 MM MISC 1 application by Does not apply route daily. 06/24/18  Yes [name], MD  ketoconazole (NIZORAL) 2 % shampoo APPLY TOPICALLY 2 (TWO) TIMES A WEEK. 04/11/16  Yes Shawnee Knapp, MD  ketorolac (ACULAR) 0.5 % ophthalmic solution Place 1 drop into both eyes 2 (two) times daily as needed. 04/11/16  Yes Shawnee Knapp, MD  losartan-hydrochlorothiazide (HYZAAR) 50-12.5 MG tablet Take 1 tablet by mouth daily. 06/12/18  Yes Wellington Hampshire, MD  omeprazole (PRILOSEC) 20 MG capsule Take 1 capsule (20 mg total) by mouth daily. 04/24/16  Yes Danis, Kirke Corin, MD  oxyCODONE-acetaminophen (PERCOCET/ROXICET) 5-325 MG tablet Take 1-2 tablets by mouth every 4 (four) hours as needed for severe pain. 09/18/17  Yes [provider]  [place] 420 MG/3.5ML SOCT Inject 420 mg as directed every 30 (thirty) days. 06/04/18  Yes Wellington Hampshire, MD  tamsulosin (FLOMAX) 0.4 MG CAPS capsule Take 0.4 mg by mouth.   Yes [provider]  UNABLE TO FIND TESTOSTERONE LIPODERM 5% (50 MG/ML) CREAM. Apply 2 ml (8 clicks) topically once a day as directed. 03/31/17  Yes [name], MD   Social History   Socioeconomic History  . Marital status: Married    Spouse name: Not on file  . Number of children: 3  . Years of education: Not on file  . Highest education level: Not on file  Occupational History  . Occupation: retired  Scientific laboratory technician  . Financial resource strain: Not on file  . Food insecurity:    Worry: Not on file    Inability: Not on file  . Transportation needs:    Medical: Not on file    Non-medical: Not on file  Tobacco Use  . Smoking status: Former Research scientist (life sciences)  .  Smokeless tobacco: Former Network engineer and Sexual Activity  . Alcohol use: Not Currently  . Drug use: No  . Sexual activity: Not on file  Lifestyle  . Physical activity:    Days per week: Not on file    Minutes per session: Not on file  . Stress: Not on file  Relationships  . Social connections:    Talks on phone: Not on file    Gets together: Not on file    Attends religious service: Not on file    Active member of club or organization: Not on file    Attends meetings of clubs or organizations: Not on file    Relationship status: Not on file  . Intimate partner violence:    Fear of current or ex partner: Not on file  Emotionally abused: Not on file    Physically abused: Not on file    Forced sexual activity: Not on file  Other Topics Concern  . Not on file  Social History Narrative  . Not on file     Observations/Objective: No distress, appropriate responses.  All questions answered.  Assessment and Plan: Type 2 diabetes mellitus with hyperglycemia, without long-term current use of insulin ([place]) - Plan: Insulin Glargine (BASAGLAR KWIKPEN) 100 UNIT/ML SOPN Improving with basaglar.  No true symptomatic lows.  Recommended consistent dosing and monitoring for any symptomatic lows.  Will remain at 12 units for now.  Continue Trulicity same dose.  Recheck 1 month with update by MyChart next few weeks if possible.   Follow Up Instructions:   4 weeks.   I discussed the assessment and treatment plan with the patient. The patient was provided an opportunity to ask questions and all were answered. The patient agreed with the plan and demonstrated an understanding of the instructions.   The patient was advised to call back or seek an in-person evaluation if the symptoms worsen or if the condition fails to improve as anticipated.  I provided 13 minutes of non-face-to-face time during this encounter.  Signed,   [name], MD [facility] at [facility].  07/08/18

## 2018-07-31 ENCOUNTER — Encounter: Payer: Self-pay | Admitting: Family Medicine

## 2018-08-02 ENCOUNTER — Encounter: Payer: Self-pay | Admitting: Family Medicine

## 2018-08-03 ENCOUNTER — Other Ambulatory Visit: Payer: Self-pay

## 2018-08-03 ENCOUNTER — Telehealth: Payer: Self-pay | Admitting: Family Medicine

## 2018-08-03 DIAGNOSIS — E1165 Type 2 diabetes mellitus with hyperglycemia: Secondary | ICD-10-CM

## 2018-08-03 MED ORDER — BASAGLAR KWIKPEN 100 UNIT/ML ~~LOC~~ SOPN: 12.0000 [IU] | PEN_INJECTOR | Freq: Every day | SUBCUTANEOUS | 1 refills | Status: DC

## 2018-08-03 NOTE — Telephone Encounter (Signed)
Looks like it was filled today.

## 2018-08-03 NOTE — Telephone Encounter (Signed)
Out of Baslagar,took last of it last night,think you could call in another [facility], I have appointment june 10,    ----- Message -----  From: Gregary Cromer  Sent: 08/03/18 8:29 AM  To: Francis Cross  Subject: RE: Appointment Request    Good Morning [name],    We have scheduled you a virtual visit on Wednesday, June 10 at 8:40 AM with Dr. Carlota Raspberry for "Diabetes med refill and follow up." This will not require you to come into the office. Our office will contact you at the appointment time.    If you need to cancel or reschedule, please call us at [phone].    Thank you!    Primary [facility] at [facility]      ----- Message -----   From: [place]: 08/02/2018 2:09 PM EDT    To: Patient Appointment Schedule Request Message List  Subject: RE: Appointment Request    Appointment Request From: Francis Cross    With Provider: [name], MD [[facility] at [facility]    Preferred Date Range: Any date 08/12/2018 or later    Preferred Times: Any time    Reason for visit: Office Visit    Comments:  Diabetes med refill and follow up

## 2018-08-05 ENCOUNTER — Other Ambulatory Visit: Payer: Self-pay | Admitting: *Deleted

## 2018-08-05 ENCOUNTER — Other Ambulatory Visit: Payer: Self-pay | Admitting: Cardiovascular Disease

## 2018-08-05 MED ORDER — ALIROCUMAB 150 MG/ML ~~LOC~~ SOAJ: 1.0000 "pen " | SUBCUTANEOUS | 11 refills | Status: DC

## 2018-08-05 NOTE — Telephone Encounter (Signed)
Repatha was discontinued today as the patient states his insurance will no longer approve this- they had requested praluent.  Dr. Fletcher Anon did ok a RX for praluent and this was sent to the pharmacy earlier today.

## 2018-08-05 NOTE — Telephone Encounter (Signed)
Please review for refill.  

## 2018-08-11 ENCOUNTER — Other Ambulatory Visit: Payer: Self-pay | Admitting: Cardiovascular Disease

## 2018-08-11 NOTE — Telephone Encounter (Signed)
New message:     The pharmacy calling concerning getting a pre- authorization. Please call pharmacy.

## 2018-08-12 ENCOUNTER — Telehealth (INDEPENDENT_AMBULATORY_CARE_PROVIDER_SITE_OTHER): Payer: BC Managed Care – PPO | Admitting: Family Medicine

## 2018-08-12 ENCOUNTER — Telehealth: Payer: Self-pay

## 2018-08-12 DIAGNOSIS — E1140 Type 2 diabetes mellitus with diabetic neuropathy, unspecified: Secondary | ICD-10-CM

## 2018-08-12 DIAGNOSIS — E1165 Type 2 diabetes mellitus with hyperglycemia: Secondary | ICD-10-CM

## 2018-08-12 MED ORDER — DULAGLUTIDE 1.5 MG/0.5ML ~~LOC~~ SOAJ: SUBCUTANEOUS | 1 refills | Status: DC

## 2018-08-12 MED ORDER — BASAGLAR KWIKPEN 100 UNIT/ML ~~LOC~~ SOPN: 14.0000 [IU] | PEN_INJECTOR | Freq: Every day | SUBCUTANEOUS | 1 refills | Status: DC

## 2018-08-12 NOTE — Telephone Encounter (Signed)
Called patient to discuss. States he has one more dose of Repatha for next month he can use. In reviewing the letter and previous entries, July 1st is when his insurance will start covering [place]. Advised patient to not go to get Praluent until after July first because then his insurance should pick up the new prescription. Advised him to please call us if this process does not work and we will help figure it out with him.  He was appreciative.

## 2018-08-12 NOTE — Progress Notes (Signed)
CC- Diabetes f/u and refill- Patient stated he is doing well. His blood sugar this am was 210. Patient was last seen here on 07/08/18 At that time he was informed to continue Basaglar 12 units and trulicity and f/u in 4 weeks.

## 2018-08-12 NOTE — Telephone Encounter (Signed)
Spoke with pharmacy they will send a new request he stated it would be for Praluent since [place] was rejected by his insurance.

## 2018-08-12 NOTE — Patient Instructions (Addendum)
Increase to 14 units of Basaglar for now then if readings remain over 200 in next 3 days, then increase to 16 units. Let me know by Mychart what readings you obtain in next 2 weeks.  Watch for low blood sugars as we go up on the dose of meds and the possible effect of steroid.  Repeat visit for labs in 5 weeks.    If you have lab work done today you will be contacted with your lab results within the next 2 weeks.  If you have not heard from [place] then please contact us. The fastest way to get your results is to register for My Chart.   IF you received an x-ray today, you will receive an invoice from [facility]. Please contact [facility] Radiology at [phone] with questions or concerns regarding your invoice.   IF you received labwork today, you will receive an invoice from [place]. Please contact [facility] at [phone] with questions or concerns regarding your invoice.   Our billing staff will not be able to assist you with questions regarding bills from these companies.  You will be contacted with the lab results as soon as they are available. The fastest way to get your results is to activate your My Chart account. Instructions are located on the last page of this paperwork. If you have not heard from [place] regarding the results in 2 weeks, please contact this office.

## 2018-08-12 NOTE — Progress Notes (Signed)
Virtual Visit via Telephone Note  I connected with Francis Cross on 08/12/18 at 8:59 AM by telephone and verified that I am speaking with the correct person using two identifiers.   I discussed the limitations, risks, security and privacy concerns of performing an evaluation and management service by telephone and the availability of in person appointments. I also discussed with the patient that there may be a patient responsible charge related to this service. The patient expressed understanding and agreed to proceed, consent obtained  Chief complaint: Diabetes  History of Present Illness: Francis Cross is a 63 y.o. male  Diabetes: Complicated by hyperglycemia.  Has been followed by neurosurgeon with back issues, has received some back injections/steroid injections but effect on hyperglycemia not thought to last more than approximately 4 weeks or so. Last epidual spinal injection May 28 - did not seem to make sugar go up as much as last.  Last visit May 6.  Started on [place] in April, continue Trulicity 1.5 mg weekly.  Plan to continue to [place] at 12 units at last visit. No new side effects of meds.  Home readings: Fasting: usually below 200, some mornings just above 200 (190, 181, 210, 224, 225) 2 hour postprandial: 288, 225, 230, sometimes a little lower.  Lowest 130 - before eating. Not usually that low.  No symptomatic lows.   Has been started on antibiotics again for repeat prostate infection. Cipro.   praluent for cholesterol, on ARB.  Microalbumin: Normal ratio in November 2019 Optho, foot exam, pneumovax: Ophthalmology exam May 2019, otherwise up-to-date  Lab Results  Component Value Date   HGBA1C 9.9 (H) 06/17/2018   HGBA1C 7.0 (H) 01/12/2018   HGBA1C 9.2 (H) 09/23/2017   Lab Results  Component Value Date   MICROALBUR 0.5 09/21/2015   LDLCALC 20 06/17/2018   CREATININE 1.10 06/17/2018      Patient Active Problem List   Diagnosis Date Noted  . Herniated  nucleus pulposus, L4-5 left 09/23/2017  . Hx of carpal tunnel repair 12/22/2015  . Testosterone deficiency 05/23/2014  . Pain in the chest 10/01/2013  . GOUT, UNSPECIFIED 01/29/2010  . Obesity 12/06/2008  . IRON OVERLOAD 08/11/2007  . HIATAL HERNIA WITH REFLUX 07/08/2006  . Hypercholesteremia 06/12/2006  . HYPERTENSION 06/12/2006  . 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 Acid reflux   . Cancer ([place])    skin cancer- basal cell on nose  . Diabetes mellitus without complication ([place])   . Difficult intubation   . Erectile dysfunction   . Fatty liver   . Gout   . Hemochromatosis   . Hiatal hernia   . History of kidney stones   . Hyperlipidemia   . Hypertension   . Iron overload   . Obesity    Past Surgical History:  Procedure Laterality Date  . APPENDECTOMY    . BACK SURGERY  09/09/2017   lumbar discectomy   . CARDIAC CATHETERIZATION     [place]    . KIDNEY STONE SURGERY  03/07/2017   laser blast  . LUMBAR LAMINECTOMY/DECOMPRESSION MICRODISCECTOMY N/A 09/23/2017   Procedure: Lumbar Four-Five Redo Discectomy for epidural hematoma.;  Surgeon: Kristeen Miss, MD;  Location: [facility];  Service: Neurosurgery;  Laterality: N/A;  . TONSILLECTOMY     Allergies  Allergen Reactions  . Cephalexin Rash  . Penicillins Itching, Rash and Other (See Comments)    Has patient had a PCN reaction causing immediate rash,  facial/tongue/throat swelling, SOB or lightheadedness with hypotension: No Has patient had a PCN reaction causing severe rash involving mucus membranes or skin necrosis: No Has patient had a PCN reaction that required hospitalization: No Has patient had a PCN reaction occurring within the last 10 years: No If all of the above answers are "NO", then may proceed with Cephalosporin use.    Prior to Admission medications   Medication Sig Start Date End Date Taking? Authorizing Provider  Alirocumab (PRALUENT) 150 MG/ML  SOAJ Inject 1 pen into the skin every 14 (fourteen) days. 08/05/18  Yes Wellington Hampshire, MD  Dulaglutide (TRULICITY) 1.5 QJ/1.9ER SOPN INJECT 1.5 MG INTO THE SKIN ONCE A WEEK. 01/17/18  Yes [name], MD  ibuprofen (ADVIL,MOTRIN) 200 MG tablet Take 400 mg by mouth every 6 (six) hours as needed for headache or moderate pain.   Yes [provider]  Insulin Glargine (BASAGLAR KWIKPEN) 100 UNIT/ML SOPN Inject 0.12 mLs (12 Units total) into the skin at bedtime. 08/03/18  Yes [name], MD  Insulin Pen Needle (PEN NEEDLES) 32G X 4 MM MISC 1 application by Does not apply route daily. 06/24/18  Yes [name], MD  ketoconazole (NIZORAL) 2 % shampoo APPLY TOPICALLY 2 (TWO) TIMES A WEEK. 04/11/16  Yes Shawnee Knapp, MD  ketorolac (ACULAR) 0.5 % ophthalmic solution Place 1 drop into both eyes 2 (two) times daily as needed. 04/11/16  Yes Shawnee Knapp, MD  losartan-hydrochlorothiazide (HYZAAR) 50-12.5 MG tablet Take 1 tablet by mouth daily. 06/12/18  Yes Wellington Hampshire, MD  omeprazole (PRILOSEC) 20 MG capsule Take 1 capsule (20 mg total) by mouth daily. 04/24/16  Yes Danis, Kirke Corin, MD  tamsulosin (FLOMAX) 0.4 MG CAPS capsule Take 0.4 mg by mouth.   Yes [provider]  UNABLE TO FIND TESTOSTERONE LIPODERM 5% (50 MG/ML) CREAM. Apply 2 ml (8 clicks) topically once a day as directed. 03/31/17  Yes [name], MD  cyclobenzaprine (FLEXERIL) 10 MG tablet Take 10 mg by mouth every 8 (eight) hours as needed for muscle spasms. 09/18/17   [provider]  oxyCODONE-acetaminophen (PERCOCET/ROXICET) 5-325 MG tablet Take 1-2 tablets by mouth every 4 (four) hours as needed for severe pain. 09/18/17   [provider]   Social History   Socioeconomic History  . Marital status: Married    Spouse name: Not on file  . Number of children: 3  . Years of education: Not on file  . Highest education level: Not on file  Occupational History  . Occupation: retired  Photographer  . Financial resource strain: Not on file  . Food insecurity:    Worry: Not on file    Inability: Not on file  . Transportation needs:    Medical: Not on file    Non-medical: Not on file  Tobacco Use  . Smoking status: Former Research scientist (life sciences)  . Smokeless tobacco: Former Network engineer and Sexual Activity  . Alcohol use: Not Currently  . Drug use: No  . Sexual activity: Not on file  Lifestyle  . Physical activity:    Days per week: Not on file    Minutes per session: Not on file  . Stress: Not on file  Relationships  . Social connections:    Talks on phone: Not on file    Gets together: Not on file    Attends religious service: Not on file    Active member of club or organization: Not on  file    Attends meetings of clubs or organizations: Not on file    Relationship status: Not on file  . Intimate partner violence:    Fear of current or ex partner: Not on file    Emotionally abused: Not on file    Physically abused: Not on file    Forced sexual activity: Not on file  Other Topics Concern  . Not on file  Social History Narrative  . Not on file     Observations/Objective: Home weight down form 208 to 198 - cut back on carbs/starches.  No distress on phone. Appropriate responses on phone, no distress, understanding of plan expressed with all questions answered  Assessment and Plan: Type 2 diabetes mellitus with hyperglycemia, without long-term current use of insulin ([place]) - Plan: Insulin Glargine (BASAGLAR KWIKPEN) 100 UNIT/ML SOPN  -Still decreased control based on home readings.  Part of the impact may be from recent infection as well as recent epidural spinal injection.    -For now we will increase Basaglar to 14 units/day, then additional 2 units in 3 days if home readings remain above 200.  Hypoglycemic precautions discussed and potential need for decline and dosing as infection clears as well as fading of epidural steroid injection.  -Update by MyChart with readings in  the next 2 weeks, 5-week follow-up in office for A1c.   Follow Up Instructions: 5 weeks.   Patient Instructions   Increase to 14 units of Basaglar for now then if readings remain over 200 in next 3 days, then increase to 16 units. Let me know by Mychart what readings you obtain in next 2 weeks.  Watch for low blood sugars as we go up on the dose of meds and the possible effect of steroid.  Repeat visit for labs in 5 weeks.    If you have lab work done today you will be contacted with your lab results within the next 2 weeks.  If you have not heard from [place] then please contact us. The fastest way to get your results is to register for My Chart.   IF you received an x-ray today, you will receive an invoice from [facility] Radiology. Please contact [facility] Radiology at [phone] with questions or concerns regarding your invoice.   IF you received labwork today, you will receive an invoice from [place]. Please contact [facility] at [phone] with questions or concerns regarding your invoice.   Our billing staff will not be able to assist you with questions regarding bills from these companies.  You will be contacted with the lab results as soon as they are available. The fastest way to get your results is to activate your My Chart account. Instructions are located on the last page of this paperwork. If you have not heard from [place] regarding the results in 2 weeks, please contact this office.          I discussed the assessment and treatment plan with the patient. The patient was provided an opportunity to ask questions and all were answered. The patient agreed with the plan and demonstrated an understanding of the instructions.   The patient was advised to call back or seek an in-person evaluation if the symptoms worsen or if the condition fails to improve as anticipated.  I provided 11 minutes of non-face-to-face time during this encounter.  Signed,   [name], MD  Primary Care at [facility].  08/12/18

## 2018-08-12 NOTE — Telephone Encounter (Signed)
Incoming fax from pharmacy.   " Preferred Drug is Repatha Push Inj. 420/3.5 Must use Repatha. Med Necessity Exception only"

## 2018-08-19 NOTE — Telephone Encounter (Signed)
This encounter was created in error - please disregard.

## 2018-08-20 ENCOUNTER — Telehealth: Payer: Self-pay

## 2018-08-20 MED ORDER — REPATHA SURECLICK 140 MG/ML ~~LOC~~ SOAJ: 140.0000 mg | SUBCUTANEOUS | 12 refills | Status: DC

## 2018-08-20 NOTE — Telephone Encounter (Signed)
Called and spoke to the pt regarding repatha and he stated that switching him to the [facility] would be fine so I sent in the rx and the pa is good till 08/19/19

## 2018-08-27 ENCOUNTER — Other Ambulatory Visit: Payer: Self-pay | Admitting: Family Medicine

## 2018-08-27 DIAGNOSIS — E1165 Type 2 diabetes mellitus with hyperglycemia: Secondary | ICD-10-CM

## 2018-09-15 LAB — HM DIABETES EYE EXAM

## 2018-09-21 ENCOUNTER — Encounter: Payer: Self-pay | Admitting: Family Medicine

## 2018-09-29 ENCOUNTER — Encounter: Payer: Self-pay | Admitting: Gastroenterology

## 2018-10-09 ENCOUNTER — Encounter: Payer: Self-pay | Admitting: Gastroenterology

## 2018-10-12 ENCOUNTER — Telehealth: Payer: Self-pay | Admitting: *Deleted

## 2018-10-12 ENCOUNTER — Telehealth: Payer: Self-pay | Admitting: Family Medicine

## 2018-10-12 DIAGNOSIS — T884XXA Failed or difficult intubation, initial encounter: Secondary | ICD-10-CM | POA: Insufficient documentation

## 2018-10-12 NOTE — Telephone Encounter (Signed)
Lelan Pons,  This pt is a difficult intubation and his procedure needs to be performed at the hospital.  Thanks,  Osvaldo Angst

## 2018-10-12 NOTE — Telephone Encounter (Signed)
Spoke with pt and he informed me that me has been checking his sugar and it has been in the 102's and 130's, advised I will sent a refill into the pharmacy until appointment he verbalized understanding.

## 2018-10-12 NOTE — Telephone Encounter (Signed)
Dr Loletha Carrow,  Do you want Mr Flatt to have an OV or can he be direct at [facility]  Per Lucretia Kern stating he has to have a hospital procedure ?   Please advise, Thanks, Lelan Pons

## 2018-10-12 NOTE — Telephone Encounter (Signed)
Francis Cross,  This pt is scheduled for a colon 9-2 WED with Dr Loletha Carrow -- he has documented difficult intubation- pt states 2012 with a neck surgery- cervical fusion  - 2019 back surgery anesthesia documented video laryngoscope for previous glide scope intubation-   pt has had an egd here 2018 with Danis- with MAC-  No documentation of difficult intubation at the 2018 Egd-    Can he have his colon in the [facility] as scheduled ?  Thanks for your time, Lelan Pons

## 2018-10-12 NOTE — Telephone Encounter (Signed)
Patient would like someone to call him about his numbers, and also would like a courtesy refill of meds until he has his appnt on 10/16/18 [place]  [facility] [place]

## 2018-10-13 NOTE — Telephone Encounter (Signed)
The procedure can be directly booked at [facility].  However, this must be coordinated with my nurse Brittani and CMA Vivien Rota because there is a list of patients waiting to be scheduled for hospital procedures.  This one is screening.

## 2018-10-13 NOTE — Telephone Encounter (Signed)
Pt has been added to the hospital wait list.

## 2018-10-13 NOTE — Telephone Encounter (Signed)
Brittani and Vivien Rota,  Due to difficult intubation, this pt needs a hospital  screen colon -  I am aware it will be the next several months. Please advise on date and If I need to cancel his PV next week and RS with the colon date. I would prefer the PV be closer to his procedure date if possible.  Thanks, Lelan Pons PV

## 2018-10-16 ENCOUNTER — Other Ambulatory Visit: Payer: Self-pay

## 2018-10-16 ENCOUNTER — Encounter: Payer: Self-pay | Admitting: Family Medicine

## 2018-10-16 ENCOUNTER — Ambulatory Visit (INDEPENDENT_AMBULATORY_CARE_PROVIDER_SITE_OTHER): Payer: BC Managed Care – PPO | Admitting: Family Medicine

## 2018-10-16 VITALS — BP 140/79 | HR 73 | Temp 98.4°F | Resp 14 | Wt 206.6 lb

## 2018-10-16 DIAGNOSIS — E1140 Type 2 diabetes mellitus with diabetic neuropathy, unspecified: Secondary | ICD-10-CM

## 2018-10-16 DIAGNOSIS — E1165 Type 2 diabetes mellitus with hyperglycemia: Secondary | ICD-10-CM

## 2018-10-16 DIAGNOSIS — Z794 Long term (current) use of insulin: Secondary | ICD-10-CM

## 2018-10-16 MED ORDER — TRULICITY 1.5 MG/0.5ML ~~LOC~~ SOAJ: SUBCUTANEOUS | 1 refills | Status: DC

## 2018-10-16 MED ORDER — BASAGLAR KWIKPEN 100 UNIT/ML ~~LOC~~ SOPN: 16.0000 [IU] | PEN_INJECTOR | Freq: Every day | SUBCUTANEOUS | 1 refills | Status: DC

## 2018-10-16 NOTE — Progress Notes (Signed)
Subjective:    Patient ID: Francis Cross, male    DOB: Mar 08, 1955, 63 y.o.   MRN: [id number]  HPI Francis Cross is a 63 y.o. male Presents today for: Chief Complaint  Patient presents with   Diabetes    Need a refill on diabetes. glucose was 140 around 2:30 today   . Diabetes:  Telemedicine visit June 10.  Complicated by hyperglycemia, steroid injections thought to be contributing somewhat to his hyperglycemia.    He was started on Basaglar in April along with Trulicity 1.5 mg weekly.  Home readings on telemedicine visit usually below 200 but high 100s, some lower 200 readings.  2-hour postprandials 225 through 288.  Low of 130 at that time.  Had been restarted on Cipro for prostate infection at that time as well. Praluent for cholesterol, on ARB.  Normal microalbumin ratio November 2019. Up to date on pneumovax, foot exam, optho.   Recommended Basaglar increased to 14 units/day, then additional 2 units every 3 days until readings below 200.  Home readings: Fasting: 105-130 2hr PP: 170 - sometimes over 200, sometimes 130-140, lowest 94 in afternoon after working outside - (3-4 hours post eating - slight fatigue at that level).   2hr pp today 141.  No true symptomatic lows.  No nutritionist.  On basaglar 16 units past few weeks. trulicity 0.3PR Q week.    No recent steroid injection (2 months ago), abx finished 2 weeks ago from 3 week Rx for epididymitis.    Lab Results  Component Value Date   HGBA1C 9.9 (H) 06/17/2018   HGBA1C 7.0 (H) 01/12/2018   HGBA1C 9.2 (H) 09/23/2017   Lab Results  Component Value Date   MICROALBUR 0.5 09/21/2015   LDLCALC 20 06/17/2018   CREATININE 1.10 06/17/2018     Patient Active Problem List   Diagnosis Date Noted   Difficult intubation    Herniated nucleus pulposus, L4-5 left 09/23/2017   Hx of carpal tunnel repair 12/22/2015   Testosterone deficiency 05/23/2014   Pain in the chest 10/01/2013   GOUT, UNSPECIFIED 01/29/2010    Obesity 12/06/2008   IRON OVERLOAD 08/11/2007   HIATAL HERNIA WITH REFLUX 07/08/2006   Hypercholesteremia 06/12/2006   HYPERTENSION 06/12/2006   ERECTILE DYSFUNCTION, 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   Past Medical History:  Diagnosis Date   Acid reflux    Cancer ([place])    skin cancer- basal cell on nose   Diabetes mellitus without complication ([place])    Difficult intubation    Erectile dysfunction    Fatty liver    Gout    Hemochromatosis    Hiatal hernia    History of kidney stones    Hyperlipidemia    Hypertension    Iron overload    Obesity    Past Surgical History:  Procedure Laterality Date   APPENDECTOMY     BACK SURGERY  09/09/2017   lumbar discectomy    CARDIAC CATHETERIZATION     [facility]   CERVICAL FUSION     KIDNEY STONE SURGERY  03/07/2017   laser blast   LUMBAR LAMINECTOMY/DECOMPRESSION MICRODISCECTOMY N/A 09/23/2017   Procedure: Lumbar Four-Five Redo Discectomy for epidural hematoma.;  Surgeon: Kristeen Miss, MD;  Location: [facility];  Service: Neurosurgery;  Laterality: N/A;   TONSILLECTOMY     Allergies  Allergen Reactions   Cephalexin Rash   Penicillins Itching, Rash and Other (See Comments)    Has patient had a PCN reaction causing immediate rash, facial/tongue/throat swelling, SOB or lightheadedness with hypotension: No  Has patient had a PCN reaction causing severe rash involving mucus membranes or skin necrosis: No Has patient had a PCN reaction that required hospitalization: No Has patient had a PCN reaction occurring within the last 10 years: No If all of the above answers are "NO", then may proceed with Cephalosporin use.    Prior to Admission medications   Medication Sig Start Date End Date Taking? Authorizing Provider  Dulaglutide (TRULICITY) 1.5 BL/3.9QZ SOPN INJECT 1.5 MG INTO THE SKIN ONCE A WEEK. 08/12/18  Yes [name], MD  Evolocumab (REPATHA SURECLICK) 009 MG/ML SOAJ Inject 140 mg into the skin every 14 (fourteen) days.  08/20/18  Yes Wellington Hampshire, MD  ibuprofen (ADVIL,MOTRIN) 200 MG tablet Take 400 mg by mouth every 6 (six) hours as needed for headache or moderate pain.   Yes [provider]  Insulin Glargine (BASAGLAR KWIKPEN) 100 UNIT/ML SOPN Inject 0.14 mLs (14 Units total) into the skin at bedtime. 08/12/18  Yes [name], MD  Insulin Pen Needle (PEN NEEDLES) 32G X 4 MM MISC 1 application by Does not apply route daily. 06/24/18  Yes [name], MD  ketoconazole (NIZORAL) 2 % shampoo APPLY TOPICALLY 2 (TWO) TIMES A WEEK. 04/11/16  Yes Shawnee Knapp, MD  ketorolac (ACULAR) 0.5 % ophthalmic solution Place 1 drop into both eyes 2 (two) times daily as needed. 04/11/16  Yes Shawnee Knapp, MD  losartan-hydrochlorothiazide (HYZAAR) 50-12.5 MG tablet Take 1 tablet by mouth daily. 06/12/18  Yes Wellington Hampshire, MD  omeprazole (PRILOSEC) 20 MG capsule Take 1 capsule (20 mg total) by mouth daily. 04/24/16  Yes Danis, Kirke Corin, MD  tamsulosin (FLOMAX) 0.4 MG CAPS capsule Take 0.4 mg by mouth.   Yes [provider]  UNABLE TO FIND TESTOSTERONE LIPODERM 5% (50 MG/ML) CREAM. Apply 2 ml (8 clicks) topically once a day as directed. 03/31/17  Yes [name], MD   Social History   Socioeconomic History   Marital status: Married    Spouse name: Not on file   Number of children: 3   Years of education: Not on file   Highest education level: Not on file  Occupational History   Occupation: retired  Scientist, product/process development strain: Not on file   Food insecurity    Worry: Not on file    Inability: Not on Lexicographer needs    Medical: Not on file    Non-medical: Not on file  Tobacco Use   Smoking status: Former Smoker   Smokeless tobacco: Former Network engineer and Sexual Activity   Alcohol use: Not Currently   Drug use: No   Sexual activity: Not on file  Lifestyle   Physical activity    Days per week: Not on file    Minutes per session: Not on  file   Stress: Not on file  Relationships   Social connections    Talks on phone: Not on file    Gets together: Not on file    Attends religious service: Not on file    Active member of club or organization: Not on file    Attends meetings of clubs or organizations: Not on file    Relationship status: Not on file   Intimate partner violence    Fear of current or ex partner: Not on file    Emotionally abused: Not on file    Physically abused: Not on file    Forced sexual  activity: Not on file  Other Topics Concern   Not on file  Social History Narrative   Not on file    Review of Systems Per HPI.     Objective:   Physical Exam Vitals signs reviewed.  Constitutional:      Appearance: He is well-developed.  HENT:     Head: Normocephalic and atraumatic.  Eyes:     Pupils: Pupils are equal, round, and reactive to light.  Neck:     Vascular: No carotid bruit or JVD.  Cardiovascular:     Rate and Rhythm: Normal rate and regular rhythm.     Heart sounds: Normal heart sounds. No murmur.  Pulmonary:     Effort: Pulmonary effort is normal.     Breath sounds: Normal breath sounds. No rales.  Skin:    General: Skin is warm and dry.  Neurological:     Mental Status: He is alert and oriented to person, place, and time.    Vitals:   10/16/18 1555  BP: 140/79  Pulse: 73  Resp: 14  Temp: 98.4 F (36.9 C)  TempSrc: Oral  SpO2: 98%  Weight: 206 lb 9.6 oz (93.7 kg)          Assessment & Plan:    Francis Cross is a 63 y.o. male Type 2 diabetes mellitus with hyperglycemia, with long-term current use of insulin ([place]) - Plan: Comprehensive metabolic panel, Hemoglobin A1c  Type 2 diabetes mellitus with diabetic neuropathy, without long-term current use of insulin ([facility]) - Plan: Dulaglutide (TRULICITY) 1.5 DT/2.6ZT SOPN  Type 2 diabetes mellitus with hyperglycemia, without long-term current use of insulin ([facility]) - Plan: Insulin Glargine (BASAGLAR KWIKPEN) 100 UNIT/ML  SOPN  -Continue Basaglar same dose for now along with Trulicity, check I4P.  Dietary advice given with increased work outside as may need healthy snack in between.  Handout given.  Changes to be determined by lab work.  Meds ordered this encounter  Medications   Dulaglutide (TRULICITY) 1.5 YK/9.9IP SOPN    Sig: INJECT 1.5 MG INTO THE SKIN ONCE A WEEK.    Dispense:  6 pen    Refill:  1   Insulin Glargine (BASAGLAR KWIKPEN) 100 UNIT/ML SOPN    Sig: Inject 0.16 mLs (16 Units total) into the skin at bedtime.    Dispense:  18 mL    Refill:  1   Patient Instructions    No change in meds for now, but may adjust with A1c results.  See info on nutrition.  If working outside for extended time, you may need healthy snack in between meals. Watch for low blood sugar symptoms if we do increase insulin, and let me know if that occurs.   Recheck in 3 months.   Return to the clinic or go to the nearest emergency room if any of your symptoms worsen or new symptoms occur.    Diabetes Mellitus and Nutrition, Adult When you have diabetes (diabetes mellitus), it is very important to have healthy eating habits because your blood sugar (glucose) levels are greatly affected by what you eat and drink. Eating healthy foods in the appropriate amounts, at about the same times every day, can help you:  Control your blood glucose.  Lower your risk of heart disease.  Improve your blood pressure.  Reach or maintain a healthy weight. Every person with diabetes is different, and each person has different needs for a meal plan. Your health care provider may recommend that you work with a diet and  nutrition specialist (dietitian) to make a meal plan that is best for you. Your meal plan may vary depending on factors such as:  The calories you need.  The medicines you take.  Your weight.  Your blood glucose, blood pressure, and cholesterol levels.  Your activity level.  Other health conditions you have,  such as heart or kidney disease. How do carbohydrates affect me? Carbohydrates, also called carbs, affect your blood glucose level more than any other type of food. Eating carbs naturally raises the amount of glucose in your blood. Carb counting is a method for keeping track of how many carbs you eat. Counting carbs is important to keep your blood glucose at a healthy level, especially if you use insulin or take certain oral diabetes medicines. It is important to know how many carbs you can safely have in each meal. This is different for every person. Your dietitian can help you calculate how many carbs you should have at each meal and for each snack. Foods that contain carbs include:  Bread, cereal, rice, pasta, and crackers.  Potatoes and corn.  Peas, beans, and lentils.  Milk and yogurt.  Fruit and juice.  Desserts, such as cakes, cookies, ice cream, and candy. How does alcohol affect me? Alcohol can cause a sudden decrease in blood glucose (hypoglycemia), especially if you use insulin or take certain oral diabetes medicines. Hypoglycemia can be a life-threatening condition. Symptoms of hypoglycemia (sleepiness, dizziness, and confusion) are similar to symptoms of having too much alcohol. If your health care provider says that alcohol is safe for you, follow these guidelines:  Limit alcohol intake to no more than 1 drink per day for nonpregnant women and 2 drinks per day for men. One drink equals 12 oz of beer, 5 oz of wine, or 1 oz of hard liquor.  Do not drink on an empty stomach.  Keep yourself hydrated with water, diet soda, or unsweetened iced tea.  Keep in mind that regular soda, juice, and other mixers may contain a lot of sugar and must be counted as carbs. What are tips for following this plan?  Reading food labels  Start by checking the serving size on the "Nutrition Facts" label of packaged foods and drinks. The amount of calories, carbs, fats, and other nutrients  listed on the label is based on one serving of the item. Many items contain more than one serving per package.  Check the total grams (g) of carbs in one serving. You can calculate the number of servings of carbs in one serving by dividing the total carbs by 15. For example, if a food has 30 g of total carbs, it would be equal to 2 servings of carbs.  Check the number of grams (g) of saturated and trans fats in one serving. Choose foods that have low or no amount of these fats.  Check the number of milligrams (mg) of salt (sodium) in one serving. Most people should limit total sodium intake to less than 2,300 mg per day.  Always check the nutrition information of foods labeled as "low-fat" or "nonfat". These foods may be higher in added sugar or refined carbs and should be avoided.  Talk to your dietitian to identify your daily goals for nutrients listed on the label. Shopping  Avoid buying canned, premade, or processed foods. These foods tend to be high in fat, sodium, and added sugar.  Shop around the outside edge of the grocery store. This includes fresh fruits and vegetables, bulk  grains, fresh meats, and fresh dairy. Cooking  Use low-heat cooking methods, such as baking, instead of high-heat cooking methods like deep frying.  Cook using healthy oils, such as olive, canola, or sunflower oil.  Avoid cooking with butter, cream, or high-fat meats. Meal planning  Eat meals and snacks regularly, preferably at the same times every day. Avoid going long periods of time without eating.  Eat foods high in fiber, such as fresh fruits, vegetables, beans, and whole grains. Talk to your dietitian about how many servings of carbs you can eat at each meal.  Eat 4-6 ounces (oz) of lean protein each day, such as lean meat, chicken, fish, eggs, or tofu. One oz of lean protein is equal to: ? 1 oz of meat, chicken, or fish. ? 1 egg. ?  cup of tofu.  Eat some foods each day that contain healthy  fats, such as avocado, nuts, seeds, and fish. Lifestyle  Check your blood glucose regularly.  Exercise regularly as told by your health care provider. This may include: ? 150 minutes of moderate-intensity or vigorous-intensity exercise each week. This could be brisk walking, biking, or water aerobics. ? Stretching and doing strength exercises, such as yoga or weightlifting, at least 2 times a week.  Take medicines as told by your health care provider.  Do not use any products that contain nicotine or tobacco, such as cigarettes and e-cigarettes. If you need help quitting, ask your health care provider.  Work with a Social worker or diabetes educator to identify strategies to manage stress and any emotional and social challenges. Questions to ask a health care provider  Do I need to meet with a diabetes educator?  Do I need to meet with a dietitian?  What number can I call if I have questions?  When are the best times to check my blood glucose? Where to find more information:  American Diabetes Association: diabetes.org  Academy of Nutrition and Dietetics: www.eatright.[employer] of Diabetes and Digestive and Kidney Diseases (NIH): [employer] Summary  A healthy meal plan will help you control your blood glucose and maintain a healthy lifestyle.  Working with a diet and nutrition specialist (dietitian) can help you make a meal plan that is best for you.  Keep in mind that carbohydrates (carbs) and alcohol have immediate effects on your blood glucose levels. It is important to count carbs and to use alcohol carefully. This information is not intended to replace advice given to you by your health care provider. Make sure you discuss any questions you have with your health care provider. Document Released: 11/15/2004 Document Revised: 01/31/2017 Document Reviewed: 03/25/2016 Elsevier Patient Education  [place].   Type 2 Diabetes Mellitus, Self Care,  Adult When you have type 2 diabetes (type 2 diabetes mellitus), you must make sure your blood sugar (glucose) stays in a healthy range. You can do this with:  Nutrition.  Exercise.  Lifestyle changes.  Medicines or insulin, if needed.  Support from your doctors and others. How to stay aware of blood sugar   Check your blood sugar level every day, as often as told.  Have your A1c (hemoglobin A1c) level checked two or more times a year. Have it checked more often if your doctor tells you to. Your doctor will set personal treatment goals for you. Generally, you should have these blood sugar levels:  Before meals (preprandial): 80-130 mg/dL (4.4-7.2 mmol/L).  After meals (postprandial): below 180 mg/dL (10 mmol/L).  A1c level: less  than 7%. How to manage high and low blood sugar Signs of high blood sugar High blood sugar is called hyperglycemia. Know the signs of high blood sugar. Signs may include:  Feeling: ? Thirsty. ? Hungry. ? Very tired.  Needing to pee (urinate) more than usual.  Blurry vision. Signs of low blood sugar Low blood sugar is called hypoglycemia. This is when blood sugar is at or below 70 mg/dL (3.9 mmol/L). Signs may include:  Feeling: ? Hungry. ? Worried or nervous (anxious). ? Sweaty and clammy. ? Confused. ? Dizzy. ? Sleepy. ? Sick to your stomach (nauseous).  Having: ? A fast heartbeat. ? A headache. ? A change in your vision. ? Jerky movements that you cannot control (seizure). ? Tingling or no feeling (numbness) around your mouth, lips, or tongue.  Having trouble with: ? Moving (coordination). ? Sleeping. ? Passing out (fainting). ? Getting upset easily (irritability). Treating low blood sugar To treat low blood sugar, eat or drink something sugary right away. If you can think clearly and swallow safely, follow the 15:15 rule:  Take 15 grams of a fast-acting carb (carbohydrate). Talk with your doctor about how much you should  take.  Some fast-acting carbs are: ? Sugar tablets (glucose pills). Take 3-4 pills. ? 6-8 pieces of hard candy. ? 4-6 oz (120-150 mL) of fruit juice. ? 4-6 oz (120-150 mL) of regular (not diet) soda. ? 1 Tbsp (15 mL) honey or sugar.  Check your blood sugar 15 minutes after you take the carb.  If your blood sugar is still at or below 70 mg/dL (3.9 mmol/L), take 15 grams of a carb again.  If your blood sugar does not go above 70 mg/dL (3.9 mmol/L) after 3 tries, get help right away.  After your blood sugar goes back to normal, eat a meal or a snack within 1 hour. Treating very low blood sugar If your blood sugar is at or below 54 mg/dL (3 mmol/L), you have very low blood sugar (severe hypoglycemia). This is an emergency. Do not wait to see if the symptoms will go away. Get medical help right away. Call your local emergency services (911 in the [place] If you have very low blood sugar and you cannot eat or drink, you may need a glucagon shot (injection). A family member or friend should learn how to check your blood sugar and how to give you a glucagon shot. Ask your doctor if you need to have a glucagon shot kit at home. Follow these instructions at home: Medicine  Take insulin and diabetes medicines as told.  If your doctor says you should take more or less insulin and medicines, do this exactly as told.  Do not run out of insulin or medicines. Having diabetes can raise your risk for other long-term conditions. These include heart disease and kidney disease. Your doctor may prescribe medicines to help you not have these problems. Food   Make healthy food choices. These include: ? Chicken, fish, egg whites, and beans. ? Oats, whole wheat, bulgur, brown rice, quinoa, and millet. ? Fresh fruits and vegetables. ? Low-fat dairy products. ? Nuts, avocado, olive oil, and canola oil.  Meet with a food specialist (dietitian). He or she can help you make an eating plan that is right for  you.  Follow instructions from your doctor about what you cannot eat or drink.  Drink enough fluid to keep your pee (urine) pale yellow.  Keep track of carbs that you eat. Do this  by reading food labels and learning food serving sizes.  Follow your sick day plan when you cannot eat or drink normally. Make this plan with your doctor so it is ready to use. Activity  Exercise 3 or more times a week.  Do not go more than 2 days without exercising.  Talk with your doctor before you start a new exercise. Your doctor may need to tell you to change: ? How much insulin or medicines you take. ? How much food you eat. Lifestyle  Do not use any tobacco products. These include cigarettes, chewing tobacco, and e-cigarettes. If you need help quitting, ask your doctor.  Ask your doctor how much alcohol is safe for you.  Learn to deal with stress. If you need help with this, ask your doctor. Body care   Stay up to date with your shots (immunizations).  Have your eyes and feet checked by a doctor as often as told.  Check your skin and feet every day. Check for cuts, bruises, redness, blisters, or sores.  Brush your teeth and gums two times a day. Floss one or more times a day.  Go to the dentist one or more times every 6 months.  Stay at a healthy weight. General instructions  Take over-the-counter and prescription medicines only as told by your doctor.  Share your diabetes care plan with: ? Your work or school. ? People you live with.  Carry a card or wear jewelry that says you have diabetes.  Keep all follow-up visits as told by your doctor. This is important. Questions to ask your doctor  Do I need to meet with a diabetes educator?  Where can I find a support group for people with diabetes? Where to find more information To learn more about diabetes, visit:  American Diabetes Association: www.diabetes.org  American Association of Diabetes Educators:  www.diabeteseducator.org Summary  When you have type 2 diabetes, you must make sure your blood sugar (glucose) stays in a healthy range.  Check your blood sugar every day, as often as told.  Having diabetes can raise your risk for other conditions. Your doctor may prescribe medicines to help you not have these problems.  Keep all follow-up visits as told by your doctor. This is important. This information is not intended to replace advice given to you by your health care provider. Make sure you discuss any questions you have with your health care provider. Document Released: 06/12/2015 Document Revised: 08/11/2017 Document Reviewed: 03/24/2015 Elsevier Patient Education  [employer].   If you have lab work done today you will be contacted with your lab results within the next 2 weeks.  If you have not heard from [place] then please contact us. The fastest way to get your results is to register for My Chart.   IF you received an x-ray today, you will receive an invoice from [facility]. Please contact [facility] at [phone] with questions or concerns regarding your invoice.   IF you received labwork today, you will receive an invoice from [facility]. Please contact [facility] at [phone] with questions or concerns regarding your invoice.   Our billing staff will not be able to assist you with questions regarding bills from these companies.  You will be contacted with the lab results as soon as they are available. The fastest way to get your results is to activate your My Chart account. Instructions are located on the last page of this paperwork. If you have not heard from [place] regarding  the results in 2 weeks, please contact this office.       Signed,   [name], MD [facility].  10/17/18 5:59 PM

## 2018-10-16 NOTE — Patient Instructions (Addendum)
No change in meds for now, but may adjust with A1c results.  See info on nutrition.  If working outside for extended time, you may need healthy snack in between meals. Watch for low blood sugar symptoms if we do increase insulin, and let me know if that occurs.   Recheck in 3 months.   Return to the clinic or go to the nearest emergency room if any of your symptoms worsen or new symptoms occur.    Diabetes Mellitus and Nutrition, Adult When you have diabetes (diabetes mellitus), it is very important to have healthy eating habits because your blood sugar (glucose) levels are greatly affected by what you eat and drink. Eating healthy foods in the appropriate amounts, at about the same times every day, can help you:  Control your blood glucose.  Lower your risk of heart disease.  Improve your blood pressure.  Reach or maintain a healthy weight. Every person with diabetes is different, and each person has different needs for a meal plan. Your health care provider may recommend that you work with a diet and nutrition specialist (dietitian) to make a meal plan that is best for you. Your meal plan may vary depending on factors such as:  The calories you need.  The medicines you take.  Your weight.  Your blood glucose, blood pressure, and cholesterol levels.  Your activity level.  Other health conditions you have, such as heart or kidney disease. How do carbohydrates affect me? Carbohydrates, also called carbs, affect your blood glucose level more than any other type of food. Eating carbs naturally raises the amount of glucose in your blood. Carb counting is a method for keeping track of how many carbs you eat. Counting carbs is important to keep your blood glucose at a healthy level, especially if you use insulin or take certain oral diabetes medicines. It is important to know how many carbs you can safely have in each meal. This is different for every person. Your dietitian can help you  calculate how many carbs you should have at each meal and for each snack. Foods that contain carbs include:  Bread, cereal, rice, pasta, and crackers.  Potatoes and corn.  Peas, beans, and lentils.  Milk and yogurt.  Fruit and juice.  Desserts, such as cakes, cookies, ice cream, and candy. How does alcohol affect me? Alcohol can cause a sudden decrease in blood glucose (hypoglycemia), especially if you use insulin or take certain oral diabetes medicines. Hypoglycemia can be a life-threatening condition. Symptoms of hypoglycemia (sleepiness, dizziness, and confusion) are similar to symptoms of having too much alcohol. If your health care provider says that alcohol is safe for you, follow these guidelines:  Limit alcohol intake to no more than 1 drink per day for nonpregnant women and 2 drinks per day for men. One drink equals 12 oz of beer, 5 oz of wine, or 1 oz of hard liquor.  Do not drink on an empty stomach.  Keep yourself hydrated with water, diet soda, or unsweetened iced tea.  Keep in mind that regular soda, juice, and other mixers may contain a lot of sugar and must be counted as carbs. What are tips for following this plan?  Reading food labels  Start by checking the serving size on the "Nutrition Facts" label of packaged foods and drinks. The amount of calories, carbs, fats, and other nutrients listed on the label is based on one serving of the item. Many items contain more than one serving per  package.  Check the total grams (g) of carbs in one serving. You can calculate the number of servings of carbs in one serving by dividing the total carbs by 15. For example, if a food has 30 g of total carbs, it would be equal to 2 servings of carbs.  Check the number of grams (g) of saturated and trans fats in one serving. Choose foods that have low or no amount of these fats.  Check the number of milligrams (mg) of salt (sodium) in one serving. Most people should limit total  sodium intake to less than 2,300 mg per day.  Always check the nutrition information of foods labeled as "low-fat" or "nonfat". These foods may be higher in added sugar or refined carbs and should be avoided.  Talk to your dietitian to identify your daily goals for nutrients listed on the label. Shopping  Avoid buying canned, premade, or processed foods. These foods tend to be high in fat, sodium, and added sugar.  Shop around the outside edge of the grocery store. This includes fresh fruits and vegetables, bulk grains, fresh meats, and fresh dairy. Cooking  Use low-heat cooking methods, such as baking, instead of high-heat cooking methods like deep frying.  Cook using healthy oils, such as olive, canola, or sunflower oil.  Avoid cooking with butter, cream, or high-fat meats. Meal planning  Eat meals and snacks regularly, preferably at the same times every day. Avoid going long periods of time without eating.  Eat foods high in fiber, such as fresh fruits, vegetables, beans, and whole grains. Talk to your dietitian about how many servings of carbs you can eat at each meal.  Eat 4-6 ounces (oz) of lean protein each day, such as lean meat, chicken, fish, eggs, or tofu. One oz of lean protein is equal to: ? 1 oz of meat, chicken, or fish. ? 1 egg. ?  cup of tofu.  Eat some foods each day that contain healthy fats, such as avocado, nuts, seeds, and fish. Lifestyle  Check your blood glucose regularly.  Exercise regularly as told by your health care provider. This may include: ? 150 minutes of moderate-intensity or vigorous-intensity exercise each week. This could be brisk walking, biking, or water aerobics. ? Stretching and doing strength exercises, such as yoga or weightlifting, at least 2 times a week.  Take medicines as told by your health care provider.  Do not use any products that contain nicotine or tobacco, such as cigarettes and e-cigarettes. If you need help quitting, ask  your health care provider.  Work with a Social worker or diabetes educator to identify strategies to manage stress and any emotional and social challenges. Questions to ask a health care provider  Do I need to meet with a diabetes educator?  Do I need to meet with a dietitian?  What number can I call if I have questions?  When are the best times to check my blood glucose? Where to find more information:  American Diabetes Association: diabetes.org  Academy of Nutrition and Dietetics: www.eatright.[employer] of Diabetes and Digestive and Kidney Diseases (NIH): [employer] Summary  A healthy meal plan will help you control your blood glucose and maintain a healthy lifestyle.  Working with a diet and nutrition specialist (dietitian) can help you make a meal plan that is best for you.  Keep in mind that carbohydrates (carbs) and alcohol have immediate effects on your blood glucose levels. It is important to count carbs and to use alcohol  carefully. This information is not intended to replace advice given to you by your health care provider. Make sure you discuss any questions you have with your health care provider. Document Released: 11/15/2004 Document Revised: 01/31/2017 Document Reviewed: 03/25/2016 Elsevier Patient Education  [place].   Type 2 Diabetes Mellitus, Self Care, Adult When you have type 2 diabetes (type 2 diabetes mellitus), you must make sure your blood sugar (glucose) stays in a healthy range. You can do this with:  Nutrition.  Exercise.  Lifestyle changes.  Medicines or insulin, if needed.  Support from your doctors and others. How to stay aware of blood sugar   Check your blood sugar level every day, as often as told.  Have your A1c (hemoglobin A1c) level checked two or more times a year. Have it checked more often if your doctor tells you to. Your doctor will set personal treatment goals for you. Generally, you should have  these blood sugar levels:  Before meals (preprandial): 80-130 mg/dL (4.4-7.2 mmol/L).  After meals (postprandial): below 180 mg/dL (10 mmol/L).  A1c level: less than 7%. How to manage high and low blood sugar Signs of high blood sugar High blood sugar is called hyperglycemia. Know the signs of high blood sugar. Signs may include:  Feeling: ? Thirsty. ? Hungry. ? Very tired.  Needing to pee (urinate) more than usual.  Blurry vision. Signs of low blood sugar Low blood sugar is called hypoglycemia. This is when blood sugar is at or below 70 mg/dL (3.9 mmol/L). Signs may include:  Feeling: ? Hungry. ? Worried or nervous (anxious). ? Sweaty and clammy. ? Confused. ? Dizzy. ? Sleepy. ? Sick to your stomach (nauseous).  Having: ? A fast heartbeat. ? A headache. ? A change in your vision. ? Jerky movements that you cannot control (seizure). ? Tingling or no feeling (numbness) around your mouth, lips, or tongue.  Having trouble with: ? Moving (coordination). ? Sleeping. ? Passing out (fainting). ? Getting upset easily (irritability). Treating low blood sugar To treat low blood sugar, eat or drink something sugary right away. If you can think clearly and swallow safely, follow the 15:15 rule:  Take 15 grams of a fast-acting carb (carbohydrate). Talk with your doctor about how much you should take.  Some fast-acting carbs are: ? Sugar tablets (glucose pills). Take 3-4 pills. ? 6-8 pieces of hard candy. ? 4-6 oz (120-150 mL) of fruit juice. ? 4-6 oz (120-150 mL) of regular (not diet) soda. ? 1 Tbsp (15 mL) honey or sugar.  Check your blood sugar 15 minutes after you take the carb.  If your blood sugar is still at or below 70 mg/dL (3.9 mmol/L), take 15 grams of a carb again.  If your blood sugar does not go above 70 mg/dL (3.9 mmol/L) after 3 tries, get help right away.  After your blood sugar goes back to normal, eat a meal or a snack within 1 hour. Treating very  low blood sugar If your blood sugar is at or below 54 mg/dL (3 mmol/L), you have very low blood sugar (severe hypoglycemia). This is an emergency. Do not wait to see if the symptoms will go away. Get medical help right away. Call your local emergency services (911 in the [place] If you have very low blood sugar and you cannot eat or drink, you may need a glucagon shot (injection). A family member or friend should learn how to check your blood sugar and how to give you a  glucagon shot. Ask your doctor if you need to have a glucagon shot kit at home. Follow these instructions at home: Medicine  Take insulin and diabetes medicines as told.  If your doctor says you should take more or less insulin and medicines, do this exactly as told.  Do not run out of insulin or medicines. Having diabetes can raise your risk for other long-term conditions. These include heart disease and kidney disease. Your doctor may prescribe medicines to help you not have these problems. Food   Make healthy food choices. These include: ? Chicken, fish, egg whites, and beans. ? Oats, whole wheat, bulgur, brown rice, quinoa, and millet. ? Fresh fruits and vegetables. ? Low-fat dairy products. ? Nuts, avocado, olive oil, and canola oil.  Meet with a food specialist (dietitian). He or she can help you make an eating plan that is right for you.  Follow instructions from your doctor about what you cannot eat or drink.  Drink enough fluid to keep your pee (urine) pale yellow.  Keep track of carbs that you eat. Do this by reading food labels and learning food serving sizes.  Follow your sick day plan when you cannot eat or drink normally. Make this plan with your doctor so it is ready to use. Activity  Exercise 3 or more times a week.  Do not go more than 2 days without exercising.  Talk with your doctor before you start a new exercise. Your doctor may need to tell you to change: ? How much insulin or medicines you  take. ? How much food you eat. Lifestyle  Do not use any tobacco products. These include cigarettes, chewing tobacco, and e-cigarettes. If you need help quitting, ask your doctor.  Ask your doctor how much alcohol is safe for you.  Learn to deal with stress. If you need help with this, ask your doctor. Body care   Stay up to date with your shots (immunizations).  Have your eyes and feet checked by a doctor as often as told.  Check your skin and feet every day. Check for cuts, bruises, redness, blisters, or sores.  Brush your teeth and gums two times a day. Floss one or more times a day.  Go to the dentist one or more times every 6 months.  Stay at a healthy weight. General instructions  Take over-the-counter and prescription medicines only as told by your doctor.  Share your diabetes care plan with: ? Your work or school. ? People you live with.  Carry a card or wear jewelry that says you have diabetes.  Keep all follow-up visits as told by your doctor. This is important. Questions to ask your doctor  Do I need to meet with a diabetes educator?  Where can I find a support group for people with diabetes? Where to find more information To learn more about diabetes, visit:  American Diabetes Association: www.diabetes.org  American Association of Diabetes Educators: www.diabeteseducator.org Summary  When you have type 2 diabetes, you must make sure your blood sugar (glucose) stays in a healthy range.  Check your blood sugar every day, as often as told.  Having diabetes can raise your risk for other conditions. Your doctor may prescribe medicines to help you not have these problems.  Keep all follow-up visits as told by your doctor. This is important. This information is not intended to replace advice given to you by your health care provider. Make sure you discuss any questions you have with your health  care provider. Document Released: 06/12/2015 Document Revised:  08/11/2017 Document Reviewed: 03/24/2015 Elsevier Patient Education  [employer].   If you have lab work done today you will be contacted with your lab results within the next 2 weeks.  If you have not heard from [place] then please contact us. The fastest way to get your results is to register for My Chart.   IF you received an x-ray today, you will receive an invoice from [facility]. Please contact [facility] at [phone] with questions or concerns regarding your invoice.   IF you received labwork today, you will receive an invoice from [place]. Please contact [facility] at [phone] with questions or concerns regarding your invoice.   Our billing staff will not be able to assist you with questions regarding bills from these companies.  You will be contacted with the lab results as soon as they are available. The fastest way to get your results is to activate your My Chart account. Instructions are located on the last page of this paperwork. If you have not heard from [place] regarding the results in 2 weeks, please contact this office.

## 2018-10-17 ENCOUNTER — Encounter: Payer: Self-pay | Admitting: Family Medicine

## 2018-10-17 LAB — COMPREHENSIVE METABOLIC PANEL
ALT: 32 IU/L (ref 0–44)
AST: 27 IU/L (ref 0–40)
Albumin/Globulin Ratio: 2.4 — ABNORMAL HIGH (ref 1.2–2.2)
Albumin: 4.8 g/dL (ref 3.8–4.8)
Alkaline Phosphatase: 74 IU/L (ref 39–117)
BUN/Creatinine Ratio: 14 (ref 10–24)
BUN: 14 mg/dL (ref 8–27)
Bilirubin Total: 0.2 mg/dL (ref 0.0–1.2)
CO2: 22 mmol/L (ref 20–29)
Calcium: 10.1 mg/dL (ref 8.6–10.2)
Chloride: 102 mmol/L (ref 96–106)
Creatinine, Ser: 0.98 mg/dL (ref 0.76–1.27)
GFR calc Af Amer: 95 mL/min/{1.73_m2} (ref >59)
GFR calc non Af Amer: 82 mL/min/{1.73_m2} (ref >59)
Globulin, Total: 2 g/dL (ref 1.5–4.5)
Glucose: 120 mg/dL — ABNORMAL HIGH (ref 65–99)
Potassium: 4.2 mmol/L (ref 3.5–5.2)
Sodium: 143 mmol/L (ref 134–144)
Total Protein: 6.8 g/dL (ref 6.0–8.5)

## 2018-10-17 LAB — HEMOGLOBIN A1C
Est. average glucose Bld gHb Est-mCnc: 157 mg/dL
Hgb A1c MFr Bld: 7.1 % — ABNORMAL HIGH (ref 4.8–5.6)

## 2018-10-21 ENCOUNTER — Encounter: Payer: Self-pay | Admitting: Family Medicine

## 2018-11-02 ENCOUNTER — Encounter: Payer: BLUE CROSS/BLUE SHIELD | Admitting: Gastroenterology

## 2018-11-04 ENCOUNTER — Encounter: Payer: BLUE CROSS/BLUE SHIELD | Admitting: Gastroenterology

## 2018-11-19 ENCOUNTER — Telehealth: Payer: Self-pay | Admitting: *Deleted

## 2018-11-19 ENCOUNTER — Other Ambulatory Visit: Payer: Self-pay | Admitting: *Deleted

## 2018-11-19 ENCOUNTER — Encounter: Payer: Self-pay | Admitting: *Deleted

## 2018-11-19 DIAGNOSIS — R1011 Right upper quadrant pain: Secondary | ICD-10-CM

## 2018-11-19 NOTE — Telephone Encounter (Signed)
Spoke to the patient who wanted to be scheduled for his hospital colonoscopy. The patient is aware of the following appointments that have been scheduled:   12/08/18 at 3:30 pm Pre-visit with the nurse  12/18/2018 at 8:30 am COVID screening at [facility]  12/22/2018 at 8:30 am Colonoscopy at [facility]

## 2018-12-06 IMAGING — CT CT HEART MORP W/ CTA COR W/ SCORE W/ CA W/CM &/OR W/O CM
4 of 7 series · 8 of 20 positions shown, 9 images · non-contrast
Comparison: None.

CLINICAL DATA: Chest pain

EXAM:
Cardiac CTA
MEDICATIONS:
Sub lingual nitro.  4mg x 2 and lopressor 5mg IV
TECHNIQUE: The patient was scanned on a Siemens [REDACTED]ice scanner. Gantry
rotation speed was 250 msecs. Collimation was 0.6 mm. A 100 kV
prospective scan was triggered in the ascending thoracic aorta at
35-75% of the R-R interval. Average HR during the scan was 60 bpm.
The 3D data set was interpreted on a dedicated work station using
MPR, MIP and VRT modes. A total of 80cc of contrast was used.

[Series 6: best diast 69 % · axial · 0.39mm/px · z∈[+1229,+1270]mm · 2 of 311 slices shown, 3 images]
[im 104/311  vessel]
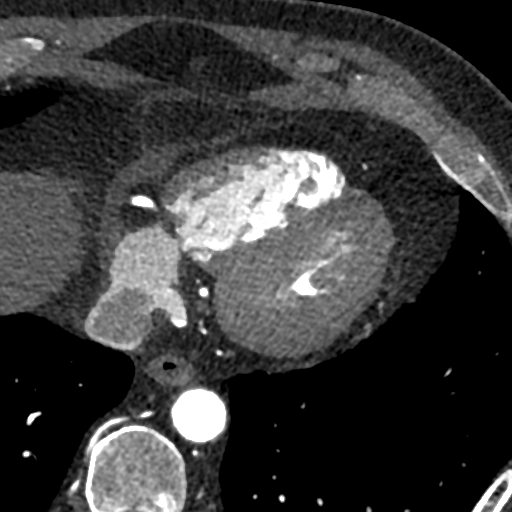
[im 104/311  lung]
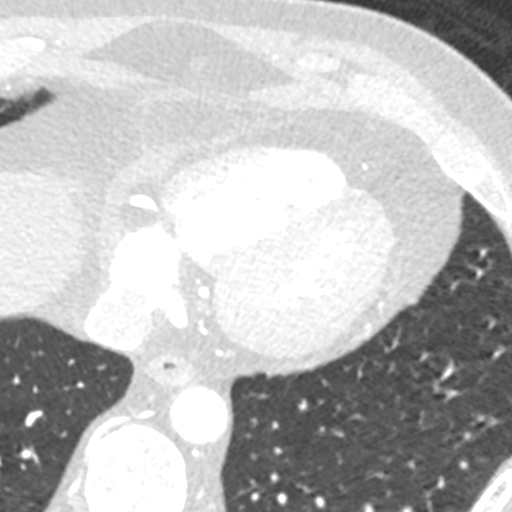
[im 207/311  vessel]
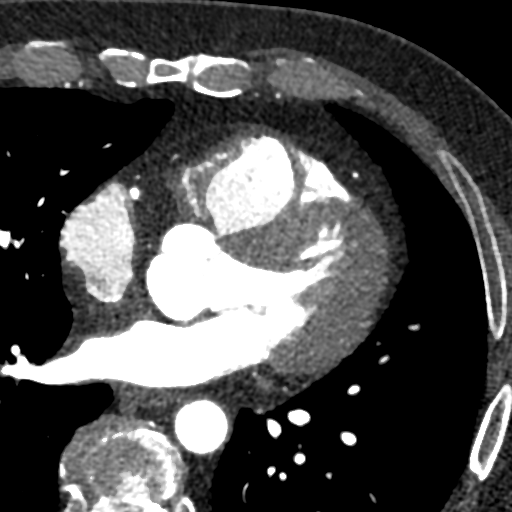

[Series 7: best syst 43 % · axial · 0.39mm/px · z∈[+1229,+1270]mm · 2 of 311 slices shown]
[im 104/311  vessel]
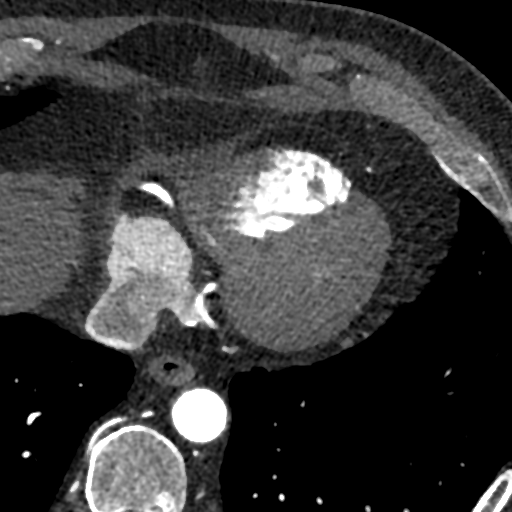
[im 207/311  vessel]
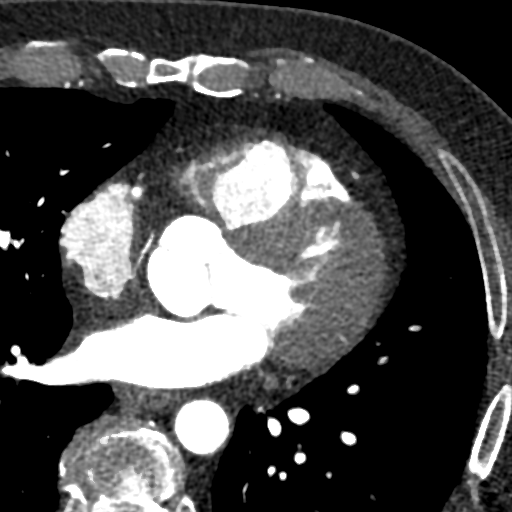

[Series 8: ts diast sharp 69 % · axial · 0.39mm/px · z∈[+1229,+1270]mm · 2 of 311 slices shown]
[im 104/311  lung]
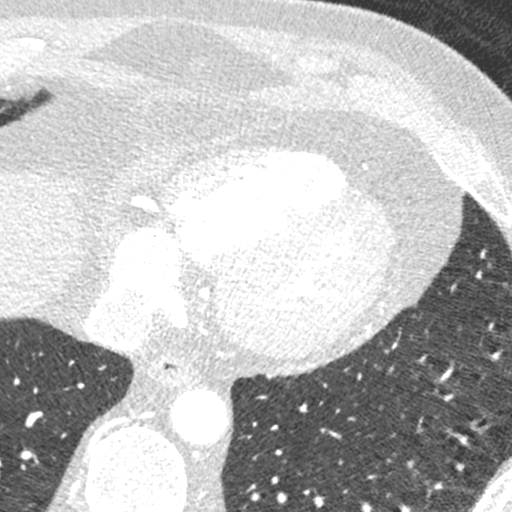
[im 207/311  lung]
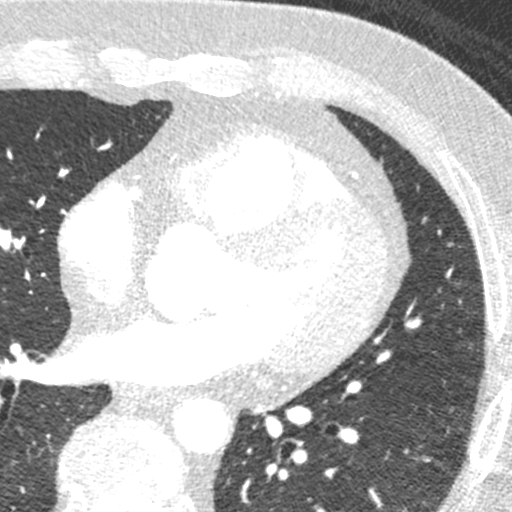

[Series 9: ts syst sharp 43 % · axial · 0.39mm/px · z∈[+1229,+1270]mm · 2 of 311 slices shown]
[im 104/311  lung]
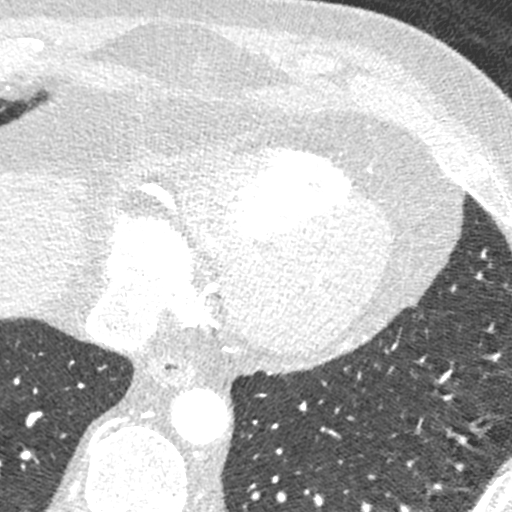
[im 207/311  lung]
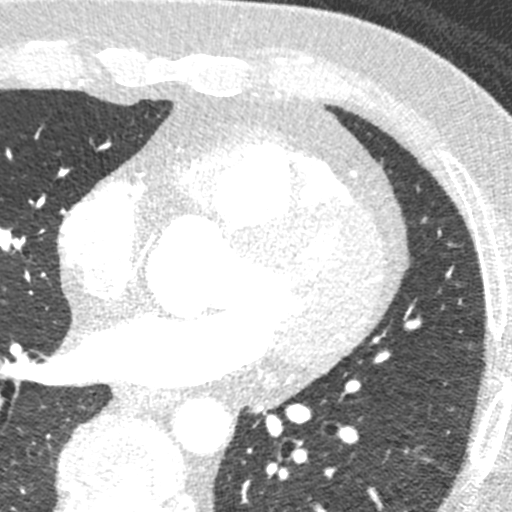

[8 of 20 positions shown; findings below may reference images not displayed]

FINDINGS: Non-cardiac: See separate report from [REDACTED].

The pulmonary veins drained normally to the right atrium. Small left
atrial appendage, no definite thrombus.

Calcium Score: 3 Agatston units.

Coronary Arteries: Right dominant with no anomalies

LM: Small area of calcified plaque in the left main without
significant stenosis.

LAD system: Small area of possible soft plaque in the proximal LAD
with mild stenosis.

Circumflex system: No plaque or stenosis.

RCA system: No plaque or stenosis.
IMPRESSION: 1. Coronary artery calcium score 3 Agatston units. This places the
patient in the 31st percentile for age and gender, suggesting low
risk for future cardiac events.

2.  Possible soft plaque proximal LAD with mild stenosis.

Salo Darboe

EXAM:
OVER-READ INTERPRETATION  CT CHEST

The following report is an over-read performed by radiologist Dr.
Mohdzamri Dill [REDACTED] on 10/31/2017. This
over-read does not include interpretation of cardiac or coronary
anatomy or pathology. The coronary calcium score/coronary CTA
interpretation by the cardiologist is attached.
FINDINGS: Within the visualized portions of the thorax there are no suspicious
appearing pulmonary nodules or masses, there is no acute
consolidative airspace disease, no pleural effusions, no
pneumothorax and no lymphadenopathy. Visualized portions of the
upper abdomen are unremarkable. There are no aggressive appearing
lytic or blastic lesions noted in the visualized portions of the
skeleton.
IMPRESSION: No significant incidental noncardiac findings are noted.

## 2018-12-08 ENCOUNTER — Other Ambulatory Visit: Payer: Self-pay

## 2018-12-08 ENCOUNTER — Ambulatory Visit (AMBULATORY_SURGERY_CENTER): Payer: Self-pay

## 2018-12-08 VITALS — Temp 96.8°F | Ht 65.0 in | Wt 210.4 lb

## 2018-12-08 DIAGNOSIS — Z1211 Encounter for screening for malignant neoplasm of colon: Secondary | ICD-10-CM

## 2018-12-08 MED ORDER — NA SULFATE-K SULFATE-MG SULF 17.5-3.13-1.6 GM/177ML PO SOLN: 1.0000 | Freq: Once | ORAL | 0 refills | Status: AC

## 2018-12-08 NOTE — Progress Notes (Signed)
Denies allergies to eggs or soy products. Denies complication of anesthesia or sedation. Denies use of weight loss medication. Denies use of O2.   Emmi instructions given for colonoscopy.  A 15.00 coupon for Suprep was given to the patient.  

## 2018-12-09 ENCOUNTER — Encounter: Payer: Self-pay | Admitting: *Deleted

## 2018-12-10 ENCOUNTER — Other Ambulatory Visit: Payer: Self-pay | Admitting: Cardiovascular Disease

## 2018-12-11 ENCOUNTER — Other Ambulatory Visit
Admission: RE | Admit: 2018-12-11 | Discharge: 2018-12-11 | Disposition: A | Discharge status: Home / Self Care | Payer: BC Managed Care – PPO | Source: Ambulatory Visit | Attending: Ophthalmology | Admitting: Ophthalmology

## 2018-12-11 DIAGNOSIS — Z20828 Contact with and (suspected) exposure to other viral communicable diseases: Secondary | ICD-10-CM | POA: Insufficient documentation

## 2018-12-11 DIAGNOSIS — Z01812 Encounter for preprocedural laboratory examination: Secondary | ICD-10-CM | POA: Insufficient documentation

## 2018-12-11 LAB — SARS CORONAVIRUS 2 (TAT 6-24 HRS): SARS Coronavirus 2: NEGATIVE

## 2018-12-11 NOTE — Discharge Instructions (Signed)

## 2018-12-15 ENCOUNTER — Ambulatory Visit
Admission: RE | Admit: 2018-12-15 | Discharge: 2018-12-15 | Disposition: A | Discharge status: Home / Self Care | Payer: BC Managed Care – PPO | Attending: Ophthalmology | Admitting: Ophthalmology

## 2018-12-15 ENCOUNTER — Other Ambulatory Visit: Payer: Self-pay

## 2018-12-15 ENCOUNTER — Ambulatory Visit: Payer: BC Managed Care – PPO | Admitting: Anesthesiology

## 2018-12-15 ENCOUNTER — Encounter
Admission: RE | Disposition: A | Discharge status: Home / Self Care | Payer: Self-pay | Source: Home / Self Care | Attending: Ophthalmology

## 2018-12-15 DIAGNOSIS — H2511 Age-related nuclear cataract, right eye: Secondary | ICD-10-CM | POA: Diagnosis not present

## 2018-12-15 DIAGNOSIS — Z981 Arthrodesis status: Secondary | ICD-10-CM | POA: Insufficient documentation

## 2018-12-15 DIAGNOSIS — Z79899 Other long term (current) drug therapy: Secondary | ICD-10-CM | POA: Diagnosis not present

## 2018-12-15 DIAGNOSIS — E669 Obesity, unspecified: Secondary | ICD-10-CM | POA: Diagnosis not present

## 2018-12-15 DIAGNOSIS — Z791 Long term (current) use of non-steroidal anti-inflammatories (NSAID): Secondary | ICD-10-CM | POA: Diagnosis not present

## 2018-12-15 DIAGNOSIS — Z794 Long term (current) use of insulin: Secondary | ICD-10-CM | POA: Insufficient documentation

## 2018-12-15 DIAGNOSIS — E1136 Type 2 diabetes mellitus with diabetic cataract: Secondary | ICD-10-CM | POA: Insufficient documentation

## 2018-12-15 DIAGNOSIS — M1990 Unspecified osteoarthritis, unspecified site: Secondary | ICD-10-CM | POA: Insufficient documentation

## 2018-12-15 DIAGNOSIS — Z6834 Body mass index (BMI) 34.0-34.9, adult: Secondary | ICD-10-CM | POA: Diagnosis not present

## 2018-12-15 DIAGNOSIS — E785 Hyperlipidemia, unspecified: Secondary | ICD-10-CM | POA: Insufficient documentation

## 2018-12-15 DIAGNOSIS — Z87891 Personal history of nicotine dependence: Secondary | ICD-10-CM | POA: Diagnosis not present

## 2018-12-15 DIAGNOSIS — I10 Essential (primary) hypertension: Secondary | ICD-10-CM | POA: Diagnosis not present

## 2018-12-15 DIAGNOSIS — Z85828 Personal history of other malignant neoplasm of skin: Secondary | ICD-10-CM | POA: Insufficient documentation

## 2018-12-15 HISTORY — PX: CATARACT EXTRACTION W/PHACO: SHX586

## 2018-12-15 HISTORY — DX: Sciatica, unspecified side: M54.30

## 2018-12-15 LAB — GLUCOSE, CAPILLARY
Glucose-Capillary: 140 mg/dL — ABNORMAL HIGH (ref 70–99)
Glucose-Capillary: 143 mg/dL — ABNORMAL HIGH (ref 70–99)

## 2018-12-15 SURGERY — PHACOEMULSIFICATION, CATARACT, WITH IOL INSERTION
Anesthesia: Monitor Anesthesia Care | Site: Eye | Laterality: Right

## 2018-12-15 MED ORDER — ARMC OPHTHALMIC DILATING DROPS
1.0000 "application " | OPHTHALMIC | Status: DC | PRN
  Administered 2018-12-15 (×3): 1 via OPHTHALMIC

## 2018-12-15 MED ORDER — EPINEPHRINE PF 1 MG/ML IJ SOLN
INTRAOCULAR | Status: DC | PRN
  Administered 2018-12-15: 81 mL via OPHTHALMIC

## 2018-12-15 MED ORDER — NA CHONDROIT SULF-NA HYALURON 40-17 MG/ML IO SOLN
INTRAOCULAR | Status: DC | PRN
  Administered 2018-12-15: 1 mL via INTRAOCULAR

## 2018-12-15 MED ORDER — MIDAZOLAM HCL 2 MG/2ML IJ SOLN
INTRAMUSCULAR | Status: DC | PRN
  Administered 2018-12-15: 2 mg via INTRAVENOUS

## 2018-12-15 MED ORDER — LACTATED RINGERS IV SOLN: INTRAVENOUS | Status: DC

## 2018-12-15 MED ORDER — MOXIFLOXACIN HCL 0.5 % OP SOLN
OPHTHALMIC | Status: DC | PRN
  Administered 2018-12-15: 0.2 mL via OPHTHALMIC

## 2018-12-15 MED ORDER — FENTANYL CITRATE (PF) 100 MCG/2ML IJ SOLN
INTRAMUSCULAR | Status: DC | PRN
  Administered 2018-12-15 (×2): 50 ug via INTRAVENOUS

## 2018-12-15 MED ORDER — TETRACAINE HCL 0.5 % OP SOLN
1.0000 [drp] | OPHTHALMIC | Status: DC | PRN
  Administered 2018-12-15 (×3): 1 [drp] via OPHTHALMIC

## 2018-12-15 MED ORDER — BRIMONIDINE TARTRATE-TIMOLOL 0.2-0.5 % OP SOLN
OPHTHALMIC | Status: DC | PRN
  Administered 2018-12-15: 1 [drp] via OPHTHALMIC

## 2018-12-15 MED ORDER — LIDOCAINE HCL (PF) 2 % IJ SOLN
INTRAOCULAR | Status: DC | PRN
  Administered 2018-12-15: 1 mL

## 2018-12-15 SURGICAL SUPPLY — 20 items
CANNULA ANT/CHMB 27G (MISCELLANEOUS) ×2 IMPLANT
CANNULA ANT/CHMB 27GA (MISCELLANEOUS) ×4 IMPLANT
GLOVE SURG LX 8.0 MICRO (GLOVE) ×1
GLOVE SURG LX STRL 8.0 MICRO (GLOVE) ×1 IMPLANT
GLOVE SURG TRIUMPH 8.0 PF LTX (GLOVE) ×2 IMPLANT
GOWN STRL REUS W/ TWL LRG LVL3 (GOWN DISPOSABLE) ×2 IMPLANT
GOWN STRL REUS W/TWL LRG LVL3 (GOWN DISPOSABLE) ×4
LENS IOL TECNIS ITEC 18.0 (Intraocular Lens) ×1 IMPLANT
MARKER SKIN DUAL TIP RULER LAB (MISCELLANEOUS) ×2 IMPLANT
NDL FILTER BLUNT 18X1 1/2 (NEEDLE) ×1 IMPLANT
NDL RETROBULBAR .5 NSTRL (NEEDLE) ×2 IMPLANT
NEEDLE FILTER BLUNT 18X 1/2SAF (NEEDLE) ×1
NEEDLE FILTER BLUNT 18X1 1/2 (NEEDLE) ×1 IMPLANT
PACK EYE AFTER SURG (MISCELLANEOUS) ×2 IMPLANT
PACK OPTHALMIC (MISCELLANEOUS) ×2 IMPLANT
PACK PORFILIO (MISCELLANEOUS) ×2 IMPLANT
SYR 3ML LL SCALE MARK (SYRINGE) ×2 IMPLANT
SYR TB 1ML LUER SLIP (SYRINGE) ×2 IMPLANT
WATER STERILE IRR 250ML POUR (IV SOLUTION) ×2 IMPLANT
WIPE NON LINTING 3.25X3.25 (MISCELLANEOUS) ×2 IMPLANT

## 2018-12-15 NOTE — Anesthesia Postprocedure Evaluation (Signed)
Anesthesia Post Note  Patient: Francis Cross  Procedure(s) Performed: CATARACT EXTRACTION PHACO AND INTRAOCULAR LENS PLACEMENT (IOC) RIGHT DIABETIC 00:35.7  16.3%  5.81 (Right Eye)  Patient location during evaluation: PACU Anesthesia Type: MAC Level of consciousness: awake and alert Pain management: pain level controlled Vital Signs Assessment: post-procedure vital signs reviewed and stable Respiratory status: spontaneous breathing, nonlabored ventilation, respiratory function stable and patient connected to nasal cannula oxygen Cardiovascular status: stable and blood pressure returned to baseline Postop Assessment: no apparent nausea or vomiting Anesthetic complications: no    Veda Canning

## 2018-12-15 NOTE — Anesthesia Preprocedure Evaluation (Signed)
Anesthesia Evaluation  Patient identified by MRN, date of birth, ID band Patient awake    Reviewed: Allergy & Precautions, NPO status   Airway Mallampati: II  TM Distance: >3 FB     Dental   Pulmonary former smoker,    breath sounds clear to auscultation       Cardiovascular hypertension,  Rhythm:Regular Rate:Normal     Neuro/Psych    GI/Hepatic hiatal hernia, GERD  ,  Endo/Other  diabetesObesity - BMI 34  Renal/GU CRFRenal disease     Musculoskeletal  (+) Arthritis ,   Abdominal   Peds  Hematology   Anesthesia Other Findings   Reproductive/Obstetrics                             Anesthesia Physical Anesthesia Plan  ASA: II  Anesthesia Plan: MAC   Post-op Pain Management:    Induction: Intravenous  PONV Risk Score and Plan:   Airway Management Planned: Nasal Cannula and Natural Airway  Additional Equipment:   Intra-op Plan:   Post-operative Plan:   Informed Consent: I have reviewed the patients History and Physical, chart, labs and discussed the procedure including the risks, benefits and alternatives for the proposed anesthesia with the patient or authorized representative who has indicated his/her understanding and acceptance.       Plan Discussed with: CRNA  Anesthesia Plan Comments:         Anesthesia Quick Evaluation

## 2018-12-15 NOTE — Op Note (Signed)
PREOPERATIVE DIAGNOSIS:  Nuclear sclerotic cataract of the right eye.   POSTOPERATIVE DIAGNOSIS:  H25.11 Cataract   OPERATIVE PROCEDURE:@   SURGEON:  Birder Robson, MD.   ANESTHESIA:  Anesthesiologist: Veda Canning, MD CRNA: Jeannene Patella, CRNA  1.      Managed anesthesia care. 2.      0.93ml of Shugarcaine was instilled in the eye following the paracentesis.   COMPLICATIONS:  None.   TECHNIQUE:   Stop and chop   DESCRIPTION OF PROCEDURE:  The patient was examined and consented in the preoperative holding area where the aforementioned topical anesthesia was applied to the right eye and then brought back to the Operating Room where the right eye was prepped and draped in the usual sterile ophthalmic fashion and a lid speculum was placed. A paracentesis was created with the side port blade and the anterior chamber was filled with viscoelastic. A near clear corneal incision was performed with the steel keratome. A continuous curvilinear capsulorrhexis was performed with a cystotome followed by the capsulorrhexis forceps. Hydrodissection and hydrodelineation were carried out with BSS on a blunt cannula. The lens was removed in a stop and chop  technique and the remaining cortical material was removed with the irrigation-aspiration handpiece. The capsular bag was inflated with viscoelastic and the Technis [id number]  lens was placed in the capsular bag without complication. The remaining viscoelastic was removed from the eye with the irrigation-aspiration handpiece. The wounds were hydrated. The anterior chamber was flushed with BSS and the eye was inflated to physiologic pressure. 0.32ml of Vigamox was placed in the anterior chamber. The wounds were found to be water tight. The eye was dressed with Combigan. The patient was given protective glasses to wear throughout the day and a shield with which to sleep tonight. The patient was also given drops with which to begin a drop regimen today and will  follow-up with me in one day. Implant Name Type Inv. Item Serial No. Manufacturer Lot No. LRB No. Used Action  LENS IOL DIOP 18.0 - [id number] Intraocular Lens LENS IOL DIOP 18.0 [id number]  Right 1 Implanted   Procedure(s) with comments: CATARACT EXTRACTION PHACO AND INTRAOCULAR LENS PLACEMENT (IOC) RIGHT DIABETIC 00:35.7  16.3%  5.81 (Right) - diabetic - insulin  Electronically signed: Birder Robson 12/15/2018 8:51 AM

## 2018-12-15 NOTE — H&P (Signed)
All labs reviewed. Abnormal studies sent to patients PCP when indicated.  Previous H&P reviewed, patient examined, there are NO CHANGES.  Francis Flegal Porfilio10/13/20208:28 AM

## 2018-12-15 NOTE — Transfer of Care (Signed)
Immediate Anesthesia Transfer of Care Note  Patient: Francis Cross  Procedure(s) Performed: CATARACT EXTRACTION PHACO AND INTRAOCULAR LENS PLACEMENT (IOC) RIGHT DIABETIC (Right Eye)  Patient Location: PACU  Anesthesia Type: MAC  Level of Consciousness: awake, alert  and patient cooperative  Airway and Oxygen Therapy: Patient Spontanous Breathing and Patient connected to supplemental oxygen  Post-op Assessment: Post-op Vital signs reviewed, Patient's Cardiovascular Status Stable, Respiratory Function Stable, Patent Airway and No signs of Nausea or vomiting  Post-op Vital Signs: Reviewed and stable  Complications: No apparent anesthesia complications

## 2018-12-16 ENCOUNTER — Encounter: Payer: Self-pay | Admitting: Ophthalmology

## 2018-12-18 ENCOUNTER — Other Ambulatory Visit (HOSPITAL_COMMUNITY)
Admission: RE | Admit: 2018-12-18 | Discharge: 2018-12-18 | Disposition: A | Discharge status: Home / Self Care | Payer: BC Managed Care – PPO | Source: Ambulatory Visit | Attending: Gastroenterology | Admitting: Gastroenterology

## 2018-12-18 DIAGNOSIS — Z20828 Contact with and (suspected) exposure to other viral communicable diseases: Secondary | ICD-10-CM | POA: Insufficient documentation

## 2018-12-18 DIAGNOSIS — Z01812 Encounter for preprocedural laboratory examination: Secondary | ICD-10-CM | POA: Diagnosis not present

## 2018-12-19 LAB — NOVEL CORONAVIRUS, NAA (HOSP ORDER, SEND-OUT TO REF LAB; TAT 18-24 HRS): SARS-CoV-2, NAA: NOT DETECTED

## 2018-12-21 ENCOUNTER — Encounter (HOSPITAL_COMMUNITY): Payer: Self-pay | Admitting: Emergency Medicine

## 2018-12-21 ENCOUNTER — Other Ambulatory Visit: Payer: Self-pay

## 2018-12-21 DIAGNOSIS — Z1211 Encounter for screening for malignant neoplasm of colon: Secondary | ICD-10-CM

## 2018-12-21 NOTE — Progress Notes (Signed)
.   Pt stated that he has been able to quarantine and has not had any flu like symptoms or fevers and has not been around anyone who has been sick.

## 2018-12-21 NOTE — Progress Notes (Signed)
Pre-op endo call completed.  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 Was told they had to use a smaller tube to intubate but that there were no other complications.

## 2018-12-22 ENCOUNTER — Ambulatory Visit (HOSPITAL_COMMUNITY): Payer: BC Managed Care – PPO | Admitting: Anesthesiology

## 2018-12-22 ENCOUNTER — Other Ambulatory Visit: Payer: Self-pay

## 2018-12-22 ENCOUNTER — Encounter (HOSPITAL_COMMUNITY): Payer: Self-pay | Admitting: Anesthesiology

## 2018-12-22 ENCOUNTER — Ambulatory Visit (HOSPITAL_COMMUNITY)
Admission: RE | Admit: 2018-12-22 | Discharge: 2018-12-22 | Disposition: A | Discharge status: Home / Self Care | Payer: BC Managed Care – PPO | Attending: Gastroenterology | Admitting: Gastroenterology

## 2018-12-22 ENCOUNTER — Encounter (HOSPITAL_COMMUNITY)
Admission: RE | Disposition: A | Discharge status: Home / Self Care | Payer: Self-pay | Source: Home / Self Care | Attending: Gastroenterology

## 2018-12-22 DIAGNOSIS — Z79899 Other long term (current) drug therapy: Secondary | ICD-10-CM | POA: Diagnosis not present

## 2018-12-22 DIAGNOSIS — I129 Hypertensive chronic kidney disease with stage 1 through stage 4 chronic kidney disease, or unspecified chronic kidney disease: Secondary | ICD-10-CM | POA: Insufficient documentation

## 2018-12-22 DIAGNOSIS — Z881 Allergy status to other antibiotic agents status: Secondary | ICD-10-CM | POA: Diagnosis not present

## 2018-12-22 DIAGNOSIS — K219 Gastro-esophageal reflux disease without esophagitis: Secondary | ICD-10-CM | POA: Diagnosis not present

## 2018-12-22 DIAGNOSIS — E1122 Type 2 diabetes mellitus with diabetic chronic kidney disease: Secondary | ICD-10-CM | POA: Insufficient documentation

## 2018-12-22 DIAGNOSIS — Z1211 Encounter for screening for malignant neoplasm of colon: Secondary | ICD-10-CM | POA: Diagnosis present

## 2018-12-22 DIAGNOSIS — M19041 Primary osteoarthritis, right hand: Secondary | ICD-10-CM | POA: Insufficient documentation

## 2018-12-22 DIAGNOSIS — E669 Obesity, unspecified: Secondary | ICD-10-CM | POA: Diagnosis not present

## 2018-12-22 DIAGNOSIS — Z880 Allergy status to penicillin: Secondary | ICD-10-CM | POA: Diagnosis not present

## 2018-12-22 DIAGNOSIS — N189 Chronic kidney disease, unspecified: Secondary | ICD-10-CM | POA: Diagnosis not present

## 2018-12-22 DIAGNOSIS — M19042 Primary osteoarthritis, left hand: Secondary | ICD-10-CM | POA: Insufficient documentation

## 2018-12-22 DIAGNOSIS — Z794 Long term (current) use of insulin: Secondary | ICD-10-CM | POA: Insufficient documentation

## 2018-12-22 DIAGNOSIS — K760 Fatty (change of) liver, not elsewhere classified: Secondary | ICD-10-CM | POA: Diagnosis not present

## 2018-12-22 DIAGNOSIS — Z6834 Body mass index (BMI) 34.0-34.9, adult: Secondary | ICD-10-CM | POA: Insufficient documentation

## 2018-12-22 DIAGNOSIS — Z791 Long term (current) use of non-steroidal anti-inflammatories (NSAID): Secondary | ICD-10-CM | POA: Diagnosis not present

## 2018-12-22 HISTORY — PX: COLONOSCOPY WITH PROPOFOL: SHX5780

## 2018-12-22 LAB — GLUCOSE, CAPILLARY: Glucose-Capillary: 145 mg/dL — ABNORMAL HIGH (ref 70–99)

## 2018-12-22 SURGERY — COLONOSCOPY WITH PROPOFOL
Anesthesia: Monitor Anesthesia Care

## 2018-12-22 MED ORDER — PROPOFOL 500 MG/50ML IV EMUL
INTRAVENOUS | Status: DC | PRN
  Administered 2018-12-22: 50 mg via INTRAVENOUS

## 2018-12-22 MED ORDER — PROPOFOL 500 MG/50ML IV EMUL
INTRAVENOUS | Status: DC | PRN
  Administered 2018-12-22: 150 ug/kg/min via INTRAVENOUS

## 2018-12-22 MED ORDER — LACTATED RINGERS IV SOLN
INTRAVENOUS | Status: DC | PRN
  Administered 2018-12-22: 08:00:00 via INTRAVENOUS

## 2018-12-22 MED ORDER — PROPOFOL 10 MG/ML IV BOLUS
INTRAVENOUS | Status: AC
  Filled 2018-12-22: qty 20

## 2018-12-22 MED ORDER — LACTATED RINGERS IV SOLN: INTRAVENOUS | Status: DC

## 2018-12-22 MED ORDER — ONDANSETRON HCL 4 MG/2ML IJ SOLN: 4.0000 mg | Freq: Once | INTRAMUSCULAR | Status: DC | PRN

## 2018-12-22 MED ORDER — SODIUM CHLORIDE 0.9 % IV SOLN: INTRAVENOUS | Status: DC

## 2018-12-22 SURGICAL SUPPLY — 22 items

## 2018-12-22 NOTE — H&P (Signed)
History:  This patient presents for endoscopic testing for colon cancer screening.  Francis Cross Referring physician: [name], MD  Past Medical History: Past Medical History:  Diagnosis Date  . Acid reflux   . Allergy   . Arthritis    hands  . Cancer ([place])    skin cancer- basal cell on nose  . Cataract   . Chronic kidney disease    Kidney stones  . Diabetes mellitus without complication ([place])   . Difficult intubation   . Erectile dysfunction   . Fatty liver   . Gout   . Hemochromatosis   . Hiatal hernia   . History of kidney stones   . Hyperlipidemia   . Hypertension   . Iron overload   . Obesity   . Sciatica    bilateral, intermittent     Past Surgical History: Past Surgical History:  Procedure Laterality Date  . APPENDECTOMY    . BACK SURGERY  09/09/2017   lumbar discectomy   . CARDIAC CATHETERIZATION     [facility]  . CATARACT EXTRACTION W/PHACO Right 12/15/2018   Procedure: CATARACT EXTRACTION PHACO AND INTRAOCULAR LENS PLACEMENT (IOC) RIGHT DIABETIC 00:35.7  16.3%  5.81;  Surgeon: Birder Robson, MD;  Location: [place];  Service: Ophthalmology;  Laterality: Right;  diabetic - insulin  . CERVICAL FUSION    . KIDNEY STONE SURGERY  03/07/2017   laser blast  . LUMBAR LAMINECTOMY/DECOMPRESSION MICRODISCECTOMY N/A 09/23/2017   Procedure: Lumbar Four-Five Redo Discectomy for epidural hematoma.;  Surgeon: Kristeen Miss, MD;  Location: [facility];  Service: Neurosurgery;  Laterality: N/A;  . TONSILLECTOMY      Allergies: Allergies  Allergen Reactions  . Keflex [Cephalexin] Rash  . Penicillins Itching, Rash and Other (See Comments)    Has patient had a PCN reaction causing immediate rash, facial/tongue/throat swelling, SOB or lightheadedness with hypotension: No Has patient had a PCN reaction causing severe rash involving mucus membranes or skin necrosis: No Has patient had a PCN reaction that required hospitalization: No Has patient had a PCN  reaction occurring within the last 10 years: No If all of the above answers are "NO", then may proceed with Cephalosporin use.     Outpatient Meds: Current Facility-Administered Medications  Medication Dose Route Frequency Provider Last Rate Last Dose  . 0.9 %  sodium chloride infusion   Intravenous Continuous Danis, Estill Cotta III, MD      . lactated ringers infusion   Intravenous Continuous Danis, Estill Cotta III, MD      . ondansetron (ZOFRAN) injection 4 mg  4 mg Intravenous Once PRN Veda Canning, MD       Facility-Administered Medications Ordered in Other Encounters  Medication Dose Route Frequency Provider Last Rate Last Dose  . lactated ringers infusion    Continuous PRN Key, Production designer, theatre/television/film, CRNA          ___________________________________________________________________ Objective   Exam:  BP (!) 163/93   Pulse 88   Temp 98.3 F (36.8 C) (Oral)   Resp 14   Ht 5\' 5"  (1.651 m)   Wt 93 kg   SpO2 97%   BMI 34.11 kg/m    CV: RRR without murmur, S1/S2, no JVD, no peripheral edema  Resp: clear to auscultation bilaterally, normal RR and effort noted  GI: soft, no tenderness, with active bowel sounds. No guarding or palpable organomegaly noted.  Neuro: awake, alert and oriented x 3. Normal gross motor function and fluent speech   Assessment:  screening  Plan:  Colonoscopy (at [facility] due to Hx difficult airway)   Nelida Meuse III

## 2018-12-22 NOTE — Op Note (Signed)
[facility] Patient Name: Francis Cross Procedure Date: 12/22/2018 MRN: [id number] Attending MD: Estill Cotta. Francis Cross , MD Date of Birth: [birth year] CSN: [id number] Age: 63 Admit Type: Outpatient Procedure:                Colonoscopy Indications:              Screening for colorectal malignant neoplasm (no                            polyps 10/2008) Providers:                Estill Cotta. Francis Carrow, MD, Grace Isaac, RN, Cherylynn Ridges, Technician, Arnoldo Hooker, CRNA Referring MD:             [name], MD Medicines:                Monitored Anesthesia Care Complications:            No immediate complications. Estimated Blood Loss:     Estimated blood loss: none. Procedure:                Pre-Anesthesia Assessment:                           - Prior to the procedure, a History and Physical                            was performed, and patient medications and                            allergies were reviewed. The patient's tolerance of                            previous anesthesia was also reviewed. The risks                            and benefits of the procedure and the sedation                            options and risks were discussed with the patient.                            All questions were answered, and informed consent                            was obtained. Prior Anticoagulants: The patient has                            taken no previous anticoagulant or antiplatelet                            agents. ASA Grade Assessment: II - A patient with  mild systemic disease. After reviewing the risks                            and benefits, the patient was deemed in                            satisfactory condition to undergo the procedure.                           After obtaining informed consent, the colonoscope                            was passed under direct vision. Throughout the                             procedure, the patient's blood pressure, pulse, and                            oxygen saturations were monitored continuously. The                            [id number]) Olympus colonoscope was                            introduced through the anus and advanced to the the                            cecum, identified by appendiceal orifice and                            ileocecal valve. The colonoscopy was performed                            without difficulty. The patient tolerated the                            procedure well. The quality of the bowel                            preparation was excellent. The ileocecal valve,                            appendiceal orifice, and rectum were photographed. Scope In: 8:29:25 AM Scope Out: 8:41:15 AM Scope Withdrawal Time: 0 hours 9 minutes 33 seconds  Total Procedure Duration: 0 hours 11 minutes 50 seconds  Findings:      The perianal and digital rectal examinations were normal.      The entire examined colon appeared normal on direct and retroflexion       views. Impression:               - The entire examined colon is normal on direct and                            retroflexion views.                           -  No specimens collected. Moderate Sedation:      Not Applicable - Patient had care per Anesthesia. Recommendation:           - Patient has a contact number available for                            emergencies. The signs and symptoms of potential                            delayed complications were discussed with the                            patient. Return to normal activities tomorrow.                            Written discharge instructions were provided to the                            patient.                           - Resume previous diet.                           - Continue present medications.                           - Repeat colonoscopy in 10 years for screening                             purposes. Procedure Code(s):        --- Professional ---                           [id number], Colorectal cancer screening; colonoscopy on                            individual not meeting criteria for high risk Diagnosis Code(s):        --- Professional ---                           Z12.11, Encounter for screening for malignant                            neoplasm of colon CPT copyright 2019 American [employer] Association. All rights reserved. The codes documented in this report are preliminary and upon coder review may  be revised to meet current compliance requirements. Henry L. Francis Carrow, MD 12/22/2018 8:44:55 AM This report has been signed electronically. Number of Addenda: 0

## 2018-12-22 NOTE — Discharge Instructions (Signed)
YOU HAD AN ENDOSCOPIC PROCEDURE TODAY: Refer to the procedure report and other information in the discharge instructions given to you for any specific questions about what was found during the examination. If this information does not answer your questions, please call [place] office at [phone] to clarify.  ° °YOU SHOULD EXPECT: Some feelings of bloating in the abdomen. Passage of more gas than usual. Walking can help get rid of the air that was put into your GI tract during the procedure and reduce the bloating. If you had a lower endoscopy (such as a colonoscopy or flexible sigmoidoscopy) you may notice spotting of blood in your stool or on the toilet paper. Some abdominal soreness may be present for a day or two, also. ° °DIET: Your first meal following the procedure should be a light meal and then it is ok to progress to your normal diet. A half-sandwich or bowl of soup is an example of a good first meal. Heavy or fried foods are harder to digest and may make you feel nauseous or bloated. Drink plenty of fluids but you should avoid alcoholic beverages for 24 hours. If you had a esophageal dilation, please see attached instructions for diet.   ° °ACTIVITY: Your care partner should take you home directly after the procedure. You should plan to take it easy, moving slowly for the rest of the day. You can resume normal activity the day after the procedure however YOU SHOULD NOT DRIVE, use power tools, machinery or perform tasks that involve climbing or major physical exertion for 24 hours (because of the sedation medicines used during the test).  ° °SYMPTOMS TO REPORT IMMEDIATELY: °A gastroenterologist can be reached at any hour. Please call [phone]  for any of the following symptoms:  °Following lower endoscopy (colonoscopy, flexible sigmoidoscopy) °Excessive amounts of blood in the stool  °Significant tenderness, worsening of abdominal pains  °Swelling of the abdomen that is new, acute  °Fever of 100° or  higher  °Following upper endoscopy (EGD, EUS, ERCP, esophageal dilation) °Vomiting of blood or coffee ground material  °New, significant abdominal pain  °New, significant chest pain or pain under the shoulder blades  °Painful or persistently difficult swallowing  °New shortness of breath  °Black, tarry-looking or red, bloody stools ° °FOLLOW UP:  °If any biopsies were taken you will be contacted by phone or by letter within the next 1-3 weeks. Call [phone]  if you have not heard about the biopsies in 3 weeks.  °Please also call with any specific questions about appointments or follow up tests. ° °

## 2018-12-22 NOTE — Anesthesia Postprocedure Evaluation (Signed)
Anesthesia Post Note  Patient: Francis Cross  Procedure(s) Performed: COLONOSCOPY WITH PROPOFOL (N/A )     Patient location during evaluation: Endoscopy Anesthesia Type: MAC Level of consciousness: awake and alert Pain management: pain level controlled Vital Signs Assessment: post-procedure vital signs reviewed and stable Respiratory status: spontaneous breathing, nonlabored ventilation and respiratory function stable Cardiovascular status: stable and blood pressure returned to baseline Postop Assessment: no apparent nausea or vomiting Anesthetic complications: no    Last Vitals:  Vitals:   12/22/18 0900 12/22/18 0905  BP: (!) 129/95   Pulse: 91 85  Resp: 20 (!) 21  Temp:    SpO2: 92% 95%    Last Pain:  Vitals:   12/22/18 0900  TempSrc:   PainSc: 0-No pain                 Lynda Rainwater

## 2018-12-22 NOTE — Interval H&P Note (Signed)
History and Physical Interval Note:  12/22/2018 8:23 AM  Francis Cross  has presented today for surgery, with the diagnosis of SCREENING.  The various methods of treatment have been discussed with the patient and family. After consideration of risks, benefits and other options for treatment, the patient has consented to  Procedure(s): COLONOSCOPY WITH PROPOFOL (N/A) as a surgical intervention.  The patient's history has been reviewed, patient examined, no change in status, stable for surgery.  I have reviewed the patient's chart and labs.  Questions were answered to the patient's satisfaction.     Nelida Meuse III

## 2018-12-22 NOTE — Transfer of Care (Signed)
Immediate Anesthesia Transfer of Care Note  Patient: Francis Cross  Procedure(s) Performed: Procedure(s): COLONOSCOPY WITH PROPOFOL (N/A)  Patient Location: PACU  Anesthesia Type:MAC  Level of Consciousness:  sedated, patient cooperative and responds to stimulation  Airway & Oxygen Therapy:Patient Spontanous Breathing and Patient connected to face mask oxgen  Post-op Assessment:  Report given to PACU RN and Post -op Vital signs reviewed and stable  Post vital signs:  Reviewed and stable  Last Vitals:  Vitals:   12/22/18 0800  BP: (!) 163/93  Pulse: 88  Resp: 14  Temp: 36.8 C  SpO2: 69%    Complications: No apparent anesthesia complications

## 2018-12-22 NOTE — Anesthesia Preprocedure Evaluation (Signed)
Anesthesia Evaluation  Patient identified by MRN, date of birth, ID band Patient awake    Reviewed: Allergy & Precautions, NPO status , Patient's Chart, lab work & pertinent test results  Airway Mallampati: II  TM Distance: >3 FB Neck ROM: Full    Dental no notable dental hx.    Pulmonary neg pulmonary ROS, former smoker,    Pulmonary exam normal breath sounds clear to auscultation       Cardiovascular hypertension, negative cardio ROS Normal cardiovascular exam Rhythm:Regular Rate:Normal     Neuro/Psych negative neurological ROS  negative psych ROS   GI/Hepatic Neg liver ROS, hiatal hernia, GERD  ,  Endo/Other  diabetesObesity - BMI 34  Renal/GU CRFRenal disease  negative genitourinary   Musculoskeletal  (+) Arthritis ,   Abdominal   Peds negative pediatric ROS (+)  Hematology negative hematology ROS (+)   Anesthesia Other Findings   Reproductive/Obstetrics negative OB ROS                             Anesthesia Physical  Anesthesia Plan  ASA: III  Anesthesia Plan: MAC   Post-op Pain Management:    Induction: Intravenous  PONV Risk Score and Plan: 1 and Ondansetron and Treatment may vary due to age or medical condition  Airway Management Planned: Natural Airway and Simple Face Mask  Additional Equipment:   Intra-op Plan:   Post-operative Plan:   Informed Consent: I have reviewed the patients History and Physical, chart, labs and discussed the procedure including the risks, benefits and alternatives for the proposed anesthesia with the patient or authorized representative who has indicated his/her understanding and acceptance.       Plan Discussed with: CRNA  Anesthesia Plan Comments:         Anesthesia Quick Evaluation

## 2018-12-23 ENCOUNTER — Encounter (HOSPITAL_COMMUNITY): Payer: Self-pay | Admitting: Gastroenterology

## 2018-12-28 ENCOUNTER — Other Ambulatory Visit: Payer: Self-pay

## 2018-12-28 ENCOUNTER — Encounter: Payer: Self-pay | Admitting: *Deleted

## 2019-01-01 ENCOUNTER — Other Ambulatory Visit: Payer: Self-pay

## 2019-01-01 ENCOUNTER — Other Ambulatory Visit
Admission: RE | Admit: 2019-01-01 | Discharge: 2019-01-01 | Disposition: A | Discharge status: Home / Self Care | Payer: BC Managed Care – PPO | Source: Ambulatory Visit | Attending: Ophthalmology | Admitting: Ophthalmology

## 2019-01-01 DIAGNOSIS — Z20828 Contact with and (suspected) exposure to other viral communicable diseases: Secondary | ICD-10-CM | POA: Diagnosis not present

## 2019-01-01 DIAGNOSIS — Z01812 Encounter for preprocedural laboratory examination: Secondary | ICD-10-CM | POA: Insufficient documentation

## 2019-01-01 LAB — SARS CORONAVIRUS 2 (TAT 6-24 HRS): SARS Coronavirus 2: NEGATIVE

## 2019-01-04 NOTE — Discharge Instructions (Signed)

## 2019-01-05 ENCOUNTER — Ambulatory Visit: Payer: BC Managed Care – PPO | Admitting: Anesthesiology

## 2019-01-05 ENCOUNTER — Encounter
Admission: RE | Disposition: A | Discharge status: Home / Self Care | Payer: Self-pay | Source: Home / Self Care | Attending: Ophthalmology

## 2019-01-05 ENCOUNTER — Ambulatory Visit
Admission: RE | Admit: 2019-01-05 | Discharge: 2019-01-05 | Disposition: A | Discharge status: Home / Self Care | Payer: BC Managed Care – PPO | Attending: Ophthalmology | Admitting: Ophthalmology

## 2019-01-05 ENCOUNTER — Other Ambulatory Visit: Payer: Self-pay

## 2019-01-05 DIAGNOSIS — Z881 Allergy status to other antibiotic agents status: Secondary | ICD-10-CM | POA: Diagnosis not present

## 2019-01-05 DIAGNOSIS — N189 Chronic kidney disease, unspecified: Secondary | ICD-10-CM | POA: Diagnosis not present

## 2019-01-05 DIAGNOSIS — H2512 Age-related nuclear cataract, left eye: Secondary | ICD-10-CM | POA: Insufficient documentation

## 2019-01-05 DIAGNOSIS — I129 Hypertensive chronic kidney disease with stage 1 through stage 4 chronic kidney disease, or unspecified chronic kidney disease: Secondary | ICD-10-CM | POA: Diagnosis not present

## 2019-01-05 DIAGNOSIS — E7800 Pure hypercholesterolemia, unspecified: Secondary | ICD-10-CM | POA: Insufficient documentation

## 2019-01-05 DIAGNOSIS — Z791 Long term (current) use of non-steroidal anti-inflammatories (NSAID): Secondary | ICD-10-CM | POA: Insufficient documentation

## 2019-01-05 DIAGNOSIS — E1136 Type 2 diabetes mellitus with diabetic cataract: Secondary | ICD-10-CM | POA: Diagnosis not present

## 2019-01-05 DIAGNOSIS — E1122 Type 2 diabetes mellitus with diabetic chronic kidney disease: Secondary | ICD-10-CM | POA: Insufficient documentation

## 2019-01-05 DIAGNOSIS — Z794 Long term (current) use of insulin: Secondary | ICD-10-CM | POA: Insufficient documentation

## 2019-01-05 DIAGNOSIS — Z87891 Personal history of nicotine dependence: Secondary | ICD-10-CM | POA: Insufficient documentation

## 2019-01-05 DIAGNOSIS — Z79899 Other long term (current) drug therapy: Secondary | ICD-10-CM | POA: Insufficient documentation

## 2019-01-05 DIAGNOSIS — K219 Gastro-esophageal reflux disease without esophagitis: Secondary | ICD-10-CM | POA: Insufficient documentation

## 2019-01-05 DIAGNOSIS — E669 Obesity, unspecified: Secondary | ICD-10-CM | POA: Insufficient documentation

## 2019-01-05 DIAGNOSIS — Z880 Allergy status to penicillin: Secondary | ICD-10-CM | POA: Diagnosis not present

## 2019-01-05 DIAGNOSIS — Z6834 Body mass index (BMI) 34.0-34.9, adult: Secondary | ICD-10-CM | POA: Diagnosis not present

## 2019-01-05 DIAGNOSIS — M1990 Unspecified osteoarthritis, unspecified site: Secondary | ICD-10-CM | POA: Insufficient documentation

## 2019-01-05 HISTORY — PX: CATARACT EXTRACTION W/PHACO: SHX586

## 2019-01-05 LAB — GLUCOSE, CAPILLARY
Glucose-Capillary: 197 mg/dL — ABNORMAL HIGH (ref 70–99)
Glucose-Capillary: 148 mg/dL — ABNORMAL HIGH (ref 70–99)

## 2019-01-05 SURGERY — PHACOEMULSIFICATION, CATARACT, WITH IOL INSERTION
Anesthesia: Monitor Anesthesia Care | Site: Eye | Laterality: Left

## 2019-01-05 MED ORDER — TETRACAINE HCL 0.5 % OP SOLN
1.0000 [drp] | OPHTHALMIC | Status: DC | PRN
  Administered 2019-01-05 (×3): 1 [drp] via OPHTHALMIC

## 2019-01-05 MED ORDER — MOXIFLOXACIN HCL 0.5 % OP SOLN
OPHTHALMIC | Status: DC | PRN
  Administered 2019-01-05: 0.2 mL via OPHTHALMIC

## 2019-01-05 MED ORDER — ARMC OPHTHALMIC DILATING DROPS
1.0000 "application " | OPHTHALMIC | Status: DC | PRN
  Administered 2019-01-05 (×3): 1 via OPHTHALMIC

## 2019-01-05 MED ORDER — NA CHONDROIT SULF-NA HYALURON 40-17 MG/ML IO SOLN
INTRAOCULAR | Status: DC | PRN
  Administered 2019-01-05: 1 mL via INTRAOCULAR

## 2019-01-05 MED ORDER — MIDAZOLAM HCL 2 MG/2ML IJ SOLN
INTRAMUSCULAR | Status: DC | PRN
  Administered 2019-01-05: 2 mg via INTRAVENOUS

## 2019-01-05 MED ORDER — LIDOCAINE HCL (PF) 2 % IJ SOLN
INTRAOCULAR | Status: DC | PRN
  Administered 2019-01-05: 1 mL

## 2019-01-05 MED ORDER — FENTANYL CITRATE (PF) 100 MCG/2ML IJ SOLN
INTRAMUSCULAR | Status: DC | PRN
  Administered 2019-01-05 (×2): 50 ug via INTRAVENOUS

## 2019-01-05 MED ORDER — BRIMONIDINE TARTRATE-TIMOLOL 0.2-0.5 % OP SOLN
OPHTHALMIC | Status: DC | PRN
  Administered 2019-01-05: 1 [drp] via OPHTHALMIC

## 2019-01-05 MED ORDER — EPINEPHRINE PF 1 MG/ML IJ SOLN
INTRAOCULAR | Status: DC | PRN
  Administered 2019-01-05: 74 mL via OPHTHALMIC

## 2019-01-05 SURGICAL SUPPLY — 22 items
CANNULA ANT/CHMB 27G (MISCELLANEOUS) ×2 IMPLANT
CANNULA ANT/CHMB 27GA (MISCELLANEOUS) ×4 IMPLANT
GLOVE SURG LX 8.0 MICRO (GLOVE) ×2
GLOVE SURG LX STRL 8.0 MICRO (GLOVE) ×1 IMPLANT
GLOVE SURG TRIUMPH 8.0 PF LTX (GLOVE) ×2 IMPLANT
GOWN STRL REUS W/ TWL LRG LVL3 (GOWN DISPOSABLE) ×2 IMPLANT
GOWN STRL REUS W/TWL LRG LVL3 (GOWN DISPOSABLE) ×4
LENS IOL TECNIS ITEC 19.5 (Intraocular Lens) ×1 IMPLANT
MARKER SKIN DUAL TIP RULER LAB (MISCELLANEOUS) ×2 IMPLANT
NDL FILTER BLUNT 18X1 1/2 (NEEDLE) ×1 IMPLANT
NDL RETROBULBAR .5 NSTRL (NEEDLE) ×2 IMPLANT
NEEDLE FILTER BLUNT 18X 1/2SAF (NEEDLE) ×1
NEEDLE FILTER BLUNT 18X1 1/2 (NEEDLE) ×1 IMPLANT
PACK EYE AFTER SURG (MISCELLANEOUS) ×2 IMPLANT
PACK OPTHALMIC (MISCELLANEOUS) ×2 IMPLANT
PACK PORFILIO (MISCELLANEOUS) ×2 IMPLANT
SUT ETHILON 10-0 CS-B-6CS-B-6 (SUTURE)
SUTURE EHLN 10-0 CS-B-6CS-B-6 (SUTURE) IMPLANT
SYR 3ML LL SCALE MARK (SYRINGE) ×2 IMPLANT
SYR TB 1ML LUER SLIP (SYRINGE) ×2 IMPLANT
WATER STERILE IRR 250ML POUR (IV SOLUTION) ×2 IMPLANT
WIPE NON LINTING 3.25X3.25 (MISCELLANEOUS) ×2 IMPLANT

## 2019-01-05 NOTE — Anesthesia Postprocedure Evaluation (Signed)
Anesthesia Post Note  Patient: Francis Cross  Procedure(s) Performed: CATARACT EXTRACTION PHACO AND INTRAOCULAR LENS PLACEMENT (IOC) LEFT DIABETIC (Left Eye)     Patient location during evaluation: PACU Anesthesia Type: MAC Level of consciousness: awake and alert Pain management: pain level controlled Vital Signs Assessment: post-procedure vital signs reviewed and stable Respiratory status: spontaneous breathing, nonlabored ventilation, respiratory function stable and patient connected to nasal cannula oxygen Cardiovascular status: stable and blood pressure returned to baseline Postop Assessment: no apparent nausea or vomiting Anesthetic complications: no    Alisa Graff

## 2019-01-05 NOTE — Op Note (Signed)
PREOPERATIVE DIAGNOSIS:  Nuclear sclerotic cataract of the left eye.   POSTOPERATIVE DIAGNOSIS:  Nuclear sclerotic cataract of the left eye.   OPERATIVE PROCEDURE:@   SURGEON:  Birder Robson, MD.   ANESTHESIA:  Anesthesiologist: Alisa Graff, MD CRNA: Georga Bora, CRNA  1.      Managed anesthesia care. 2.     0.37ml of Shugarcaine was instilled following the paracentesis   COMPLICATIONS:  None.   TECHNIQUE:   Stop and chop   DESCRIPTION OF PROCEDURE:  The patient was examined and consented in the preoperative holding area where the aforementioned topical anesthesia was applied to the left eye and then brought back to the Operating Room where the left eye was prepped and draped in the usual sterile ophthalmic fashion and a lid speculum was placed. A paracentesis was created with the side port blade and the anterior chamber was filled with viscoelastic. A near clear corneal incision was performed with the steel keratome. A continuous curvilinear capsulorrhexis was performed with a cystotome followed by the capsulorrhexis forceps. Hydrodissection and hydrodelineation were carried out with BSS on a blunt cannula. The lens was removed in a stop and chop  technique and the remaining cortical material was removed with the irrigation-aspiration handpiece. The capsular bag was inflated with viscoelastic and the Technis [id number] lens was placed in the capsular bag without complication. The remaining viscoelastic was removed from the eye with the irrigation-aspiration handpiece. The wounds were hydrated. The anterior chamber was flushed with BSS and the eye was inflated to physiologic pressure. 0.51ml Vigamox was placed in the anterior chamber. The wounds were found to be water tight. The eye was dressed with Combigan. The patient was given protective glasses to wear throughout the day and a shield with which to sleep tonight. The patient was also given drops with which to begin a drop regimen today and  will follow-up with me in one day. Implant Name Type Inv. Item Serial No. Manufacturer Lot No. LRB No. Used Action  LENS IOL DIOP 19.5 - [id number] Intraocular Lens LENS IOL DIOP 19.5 [id number]  Left 1 Implanted    Procedure(s) with comments: CATARACT EXTRACTION PHACO AND INTRAOCULAR LENS PLACEMENT (IOC) LEFT DIABETIC (Left) - 0:31 16.4% 5.20  Electronically signed: Birder Robson 01/05/2019 9:32 AM

## 2019-01-05 NOTE — Anesthesia Preprocedure Evaluation (Signed)
Anesthesia Evaluation  Patient identified by MRN, date of birth, ID band Patient awake    Reviewed: Allergy & Precautions, H&P , NPO status , Patient's Chart, lab work & pertinent test results, reviewed documented beta blocker date and time   History of Anesthesia Complications (+) DIFFICULT AIRWAY and history of anesthetic complications  Airway Mallampati: II  TM Distance: >3 FB Neck ROM: full    Dental no notable dental hx.    Pulmonary neg pulmonary ROS, former smoker,    Pulmonary exam normal breath sounds clear to auscultation       Cardiovascular Exercise Tolerance: Good hypertension,  Rhythm:regular Rate:Normal     Neuro/Psych negative neurological ROS  negative psych ROS   GI/Hepatic Neg liver ROS, hiatal hernia, GERD  ,  Endo/Other  negative endocrine ROSdiabetes  Renal/GU CRFH/o kidney stones  negative genitourinary   Musculoskeletal   Abdominal   Peds  Hematology hemochromatosis   Anesthesia Other Findings   Reproductive/Obstetrics negative OB ROS                             Anesthesia Physical Anesthesia Plan  ASA: III  Anesthesia Plan: MAC   Post-op Pain Management:    Induction:   PONV Risk Score and Plan: 1 and Treatment may vary due to age or medical condition  Airway Management Planned:   Additional Equipment:   Intra-op Plan:   Post-operative Plan:   Informed Consent: I have reviewed the patients History and Physical, chart, labs and discussed the procedure including the risks, benefits and alternatives for the proposed anesthesia with the patient or authorized representative who has indicated his/her understanding and acceptance.     Dental Advisory Given  Plan Discussed with: CRNA  Anesthesia Plan Comments:         Anesthesia Quick Evaluation

## 2019-01-05 NOTE — Anesthesia Procedure Notes (Signed)
Procedure Name: MAC Date/Time: 01/05/2019 9:17 AM Performed by: Georga Bora, CRNA Pre-anesthesia Checklist: Patient identified, Suction available, Emergency Drugs available, Patient being monitored and Timeout performed Patient Re-evaluated:Patient Re-evaluated prior to induction Oxygen Delivery Method: Nasal cannula

## 2019-01-05 NOTE — Transfer of Care (Signed)
Immediate Anesthesia Transfer of Care Note  Patient: Francis Cross  Procedure(s) Performed: CATARACT EXTRACTION PHACO AND INTRAOCULAR LENS PLACEMENT (IOC) LEFT DIABETIC (Left Eye)  Patient Location: PACU  Anesthesia Type: MAC  Level of Consciousness: awake, alert  and patient cooperative  Airway and Oxygen Therapy: Patient Spontanous Breathing and Patient connected to supplemental oxygen  Post-op Assessment: Post-op Vital signs reviewed, Patient's Cardiovascular Status Stable, Respiratory Function Stable, Patent Airway and No signs of Nausea or vomiting  Post-op Vital Signs: Reviewed and stable  Complications: No apparent anesthesia complications

## 2019-01-05 NOTE — H&P (Signed)
All labs reviewed. Abnormal studies sent to patients PCP when indicated.  Previous H&P reviewed, patient examined, there are NO CHANGES.  Francis Kibble Porfilio11/3/20208:59 AM

## 2019-01-06 ENCOUNTER — Encounter: Payer: Self-pay | Admitting: Ophthalmology

## 2019-01-12 ENCOUNTER — Telehealth: Payer: Self-pay | Admitting: Family Medicine

## 2019-01-12 NOTE — Telephone Encounter (Signed)
Pt has appt .01/21/2019. 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 Was hoping he could have this completed by his Friday appt. Put paper work to be completed by provider in [place] mail box at nurses station.  FR

## 2019-01-15 ENCOUNTER — Ambulatory Visit: Payer: BC Managed Care – PPO | Admitting: Family Medicine

## 2019-01-15 ENCOUNTER — Encounter: Payer: Self-pay | Admitting: Family Medicine

## 2019-01-15 ENCOUNTER — Other Ambulatory Visit: Payer: Self-pay

## 2019-01-15 VITALS — BP 137/88 | HR 87 | Temp 97.6°F | Resp 16 | Ht 65.0 in | Wt 210.0 lb

## 2019-01-15 DIAGNOSIS — Z794 Long term (current) use of insulin: Secondary | ICD-10-CM

## 2019-01-15 DIAGNOSIS — Z23 Encounter for immunization: Secondary | ICD-10-CM | POA: Diagnosis not present

## 2019-01-15 DIAGNOSIS — E1165 Type 2 diabetes mellitus with hyperglycemia: Secondary | ICD-10-CM | POA: Diagnosis not present

## 2019-01-15 LAB — HEMOGLOBIN A1C
Est. average glucose Bld gHb Est-mCnc: 174 mg/dL
Hgb A1c MFr Bld: 7.7 % — ABNORMAL HIGH (ref 4.8–5.6)

## 2019-01-15 NOTE — Patient Instructions (Addendum)
  Weight is increasing.  Try to increase low intensity exercise most days per week such as walking.  Goal of 150 minutes/week.  I will check A1c, but no changes until I see that level.  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  Recheck in 3 months   If you have lab work done today you will be contacted with your lab results within the next 2 weeks.  If you have not heard from [place] then please contact us. The fastest way to get your results is to register for My Chart.   IF you received an x-ray today, you will receive an invoice from [facility]. Please contact [facility] at [phone] with questions or concerns regarding your invoice.   IF you received labwork today, you will receive an invoice from [facility]. Please contact [facility] at [phone] with questions or concerns regarding your invoice.   Our billing staff will not be able to assist you with questions regarding bills from these companies.  You will be contacted with the lab results as soon as they are available. The fastest way to get your results is to activate your My Chart account. Instructions are located on the last page of this paperwork. If you have not heard from [place] regarding the results in 2 weeks, please contact this office.

## 2019-01-15 NOTE — Progress Notes (Signed)
Subjective:  Patient ID: Francis Cross, male    DOB: 1956-01-12  Age: 63 y.o. MRN: [id number]  CC:  Chief Complaint  Patient presents with  . Diabetes    follow up     HPI Francis Cross presents for   Diabetes: Complicated by hyperglycemia, diabetic neuropathy. Treated with Basaglar 16 units at bedtime, Trulicity 1.5 mg weekly.  Supplemental snacks with increased outside work discussed at last visit in August, but no true hypoglycemia, lowest of 94. He is on Repatha for hyperlipidemia.  Followed by cardiology.  Takes ARB. Microalbumin: Normal ratio last November. 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 Initial nsulin use with SSI 09/24/17.  Cataract extraction October 13, November 3.  Home readings: Fasting 120-150 2hr PP: 175-200 No symptomatic lows. Not below 100.  Compliant with meds. Less exercise with pandemic.   Diabetic Foot Exam - Simple   Simple Foot Form Diabetic Foot exam was performed with the following findings: Yes 01/15/2019  9:00 AM  Visual Inspection No deformities, no ulcerations, no other skin breakdown bilaterally: Yes Sensation Testing Intact to touch and monofilament testing bilaterally: Yes Pulse Check Posterior Tibialis and Dorsalis pulse intact bilaterally: Yes Comments Pt states that at times he has numbness in his left foot mostly. At times his right. No numbness today    Lab Results  Component Value Date   HGBA1C 7.1 (H) 10/16/2018   HGBA1C 9.9 (H) 06/17/2018   HGBA1C 7.0 (H) 01/12/2018   Lab Results  Component Value Date   MICROALBUR 0.5 09/21/2015   LDLCALC 20 06/17/2018   CREATININE 0.98 10/16/2018   Wt Readings from Last 3 Encounters:  01/15/19 210 lb (95.3 kg)  01/05/19 207 lb (93.9 kg)  12/22/18 205 lb (93 kg)     History Patient Active Problem List   Diagnosis Date Noted  . Special screening for malignant neoplasms, colon   . Difficult intubation   . Herniated  nucleus pulposus, L4-5 left 09/23/2017  . Hx of carpal tunnel repair 12/22/2015  . Testosterone deficiency 05/23/2014  . Pain in the chest 10/01/2013  . GOUT, UNSPECIFIED 01/29/2010  . Obesity 12/06/2008  . IRON OVERLOAD 08/11/2007  . HIATAL HERNIA WITH REFLUX 07/08/2006  . Hypercholesteremia 06/12/2006  . HYPERTENSION 06/12/2006  . 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 Acid reflux   . Allergy   . Arthritis    hands  . Cancer ([place])    skin cancer- basal cell on nose  . Cataract   . Chronic kidney disease    Kidney stones  . Diabetes mellitus without complication ([place])   . Difficult intubation   . Erectile dysfunction   . Fatty liver   . Gout   . Hemochromatosis   . Hiatal hernia   . History of kidney stones   . Hyperlipidemia   . Hypertension   . Iron overload   . Obesity   . Sciatica    bilateral, intermittent   Past Surgical History:  Procedure Laterality Date  . APPENDECTOMY    . BACK SURGERY  09/09/2017   lumbar discectomy   . CARDIAC CATHETERIZATION     [facility]  . CATARACT EXTRACTION W/PHACO Right 12/15/2018   Procedure: CATARACT EXTRACTION PHACO AND INTRAOCULAR LENS PLACEMENT (IOC) RIGHT DIABETIC 00:35.7  16.3%  5.81;  Surgeon: Birder Robson, MD;  Location: [place];  Service: Ophthalmology;  Laterality: Right;  diabetic - insulin  . CATARACT EXTRACTION  W/PHACO Left 01/05/2019   Procedure: CATARACT EXTRACTION PHACO AND INTRAOCULAR LENS PLACEMENT (IOC) LEFT DIABETIC;  Surgeon: Birder Robson, MD;  Location: [place];  Service: Ophthalmology;  Laterality: Left;  0:31 16.4% 5.20  . CERVICAL FUSION    . COLONOSCOPY WITH PROPOFOL N/A 12/22/2018   Procedure: COLONOSCOPY WITH PROPOFOL;  Surgeon: Doran Stabler, MD;  [facility];  Service: Gastroenterology;  Laterality: N/A;  . KIDNEY STONE SURGERY  03/07/2017   laser blast  . LUMBAR LAMINECTOMY/DECOMPRESSION MICRODISCECTOMY N/A  09/23/2017   Procedure: Lumbar Four-Five Redo Discectomy for epidural hematoma.;  Surgeon: Kristeen Miss, MD;  Location: [place];  Service: Neurosurgery;  Laterality: N/A;  . TONSILLECTOMY     Allergies  Allergen Reactions  . Keflex [Cephalexin] Rash  . Penicillins Itching, Rash and Other (See Comments)    Has patient had a PCN reaction causing immediate rash, facial/tongue/throat swelling, SOB or lightheadedness with hypotension: No Has patient had a PCN reaction causing severe rash involving mucus membranes or skin necrosis: No Has patient had a PCN reaction that required hospitalization: No Has patient had a PCN reaction occurring within the last 10 years: No If all of the above answers are "NO", then may proceed with Cephalosporin use.    Prior to Admission medications   Medication Sig Start Date End Date Taking? Authorizing Provider  acetaminophen (TYLENOL) 325 MG tablet Take 650 mg by mouth every 6 (six) hours as needed for mild pain or moderate pain.    [provider]  celecoxib (CELEBREX) 200 MG capsule Take 200 mg by mouth at bedtime.  11/18/18   [provider]  Dulaglutide (TRULICITY) 1.5 0000000 SOPN INJECT 1.5 MG INTO THE SKIN ONCE A WEEK. Patient taking differently: Inject 1.5 mg into the skin once a week. Saturday 10/16/18   [name], MD  Evolocumab (REPATHA SURECLICK) XX123456 MG/ML SOAJ Inject 140 mg into the skin every 14 (fourteen) days. 08/20/18   Wellington Hampshire, MD  Insulin Glargine (BASAGLAR KWIKPEN) 100 UNIT/ML SOPN Inject 0.16 mLs (16 Units total) into the skin at bedtime. 10/16/18   [name], MD  Insulin Pen Needle (PEN NEEDLES) 32G X 4 MM MISC 1 application by Does not apply route daily. 06/24/18   [name], MD  ketoconazole (NIZORAL) 2 % shampoo APPLY TOPICALLY 2 (TWO) TIMES A WEEK. Patient taking differently: Apply 1 application topically 2 (two) times a week.  04/11/16   Shawnee Knapp, MD  ketorolac (ACULAR) 0.5 % ophthalmic  solution Place 1 drop into both eyes 2 (two) times daily as needed. 04/11/16   Shawnee Knapp, MD  losartan-hydrochlorothiazide (HYZAAR) 50-12.5 MG tablet TAKE 1 TABLET BY MOUTH EVERY DAY Patient taking differently: Take 1 tablet by mouth daily.  12/10/18   Wellington Hampshire, MD  omeprazole (PRILOSEC) 20 MG capsule Take 1 capsule (20 mg total) by mouth daily. Patient taking differently: Take 20 mg by mouth as needed (Heartburn).  04/24/16   Doran Stabler, MD  Prednisolon-Gatiflox-Bromfenac 1-0.5-0.075 % SOLN Place 1 drop into the right eye See admin instructions. 1st week 4 times a day 2nd week 3 times a day 3rd week 2 times a day 4th week 1 time a day until complete    [provider]  tamsulosin (FLOMAX) 0.4 MG CAPS capsule Take 0.4 mg by mouth as needed (Kidney stone).     [provider]   Social History   Socioeconomic History  . Marital status: Married  Spouse name: Not on file  . Number of children: 3  . Years of education: Not on file  . Highest education level: Not on file  Occupational History  . Occupation: retired  Scientific laboratory technician  . Financial resource strain: Not on file  . Food insecurity    Worry: Not on file    Inability: Not on file  . Transportation needs    Medical: Not on file    Non-medical: Not on file  Tobacco Use  . Smoking status: Former Research scientist (life sciences)  . Smokeless tobacco: Former Systems developer  . Tobacco comment: socially as teenager  Substance and Sexual Activity  . Alcohol use: Not Currently  . Drug use: No  . Sexual activity: Not on file  Lifestyle  . Physical activity    Days per week: Not on file    Minutes per session: Not on file  . Stress: Not on file  Relationships  . Social Herbalist on phone: Not on file    Gets together: Not on file    Attends religious service: Not on file    Active member of club or organization: Not on file    Attends meetings of clubs or organizations: Not on file    Relationship status: Not on file   . Intimate partner violence    Fear of current or ex partner: Not on file    Emotionally abused: Not on file    Physically abused: Not on file    Forced sexual activity: Not on file  Other Topics Concern  . Not on file  Social History Narrative  . Not on file    Review of Systems  Constitutional: Negative for fatigue and unexpected weight change.  Eyes: Negative for visual disturbance.  Respiratory: Negative for cough, chest tightness and shortness of breath.   Cardiovascular: Negative for chest pain, palpitations and leg swelling.  Gastrointestinal: Negative for abdominal pain and blood in stool.  Neurological: Negative for dizziness, light-headedness and headaches.     Objective:   Vitals:   01/15/19 0902  BP: 137/88  Pulse: 87  Resp: 16  Temp: 97.6 F (36.4 C)  TempSrc: Oral  SpO2: 97%  Weight: 210 lb (95.3 kg)  Height: 5\' 5"  (1.651 m)    Physical Exam Vitals signs reviewed.  Constitutional:      Appearance: He is well-developed.  HENT:     Head: Normocephalic and atraumatic.  Eyes:     Pupils: Pupils are equal, round, and reactive to light.  Neck:     Vascular: No carotid bruit or JVD.  Cardiovascular:     Rate and Rhythm: Normal rate and regular rhythm.     Heart sounds: Normal heart sounds. No murmur.  Pulmonary:     Effort: Pulmonary effort is normal.     Breath sounds: Normal breath sounds. No rales.  Musculoskeletal:     Comments: Equal LE strength.   Skin:    General: Skin is warm and dry.  Neurological:     Mental Status: He is alert and oriented to person, place, and time.      Assessment & Plan:  Daeveon Camilli is a 63 y.o. male . Type 2 diabetes mellitus with hyperglycemia, with long-term current use of insulin (HCC) - Plan: Hemoglobin A1c, HM Diabetes Foot Exam  Need for diphtheria-tetanus-pertussis (Tdap) vaccine - Plan: TDAP VACCINE Check A1c, no med changes for now.  Increased exercise with low intensity exercise discussed.  Recheck  3 months.  Paperwork  completed for DOT, see copies. Tdap vaccine given   No orders of the defined types were placed in this encounter.  Patient Instructions       If you have lab work done today you will be contacted with your lab results within the next 2 weeks.  If you have not heard from [place] then please contact us. The fastest way to get your results is to register for My Chart.   IF you received an x-Cross today, you will receive an invoice from [facility]. Please contact [facility] at [phone] with questions or concerns regarding your invoice.   IF you received labwork today, you will receive an invoice from [place]. Please contact [facility] at [phone] with questions or concerns regarding your invoice.   Our billing staff will not be able to assist you with questions regarding bills from these companies.  You will be contacted with the lab results as soon as they are available. The fastest way to get your results is to activate your My Chart account. Instructions are located on the last page of this paperwork. If you have not heard from [place] regarding the results in 2 weeks, please contact this office.          Signed, Francis Ray, MD [facility]

## 2019-04-01 ENCOUNTER — Other Ambulatory Visit: Payer: Self-pay | Admitting: Family Medicine

## 2019-04-01 DIAGNOSIS — E1165 Type 2 diabetes mellitus with hyperglycemia: Secondary | ICD-10-CM

## 2019-04-01 NOTE — Telephone Encounter (Signed)
Forwarding medication refill request to the clinical pool for review. Disp: not specified

## 2019-04-27 ENCOUNTER — Ambulatory Visit: Payer: BC Managed Care – PPO | Admitting: Cardiovascular Disease

## 2019-05-04 ENCOUNTER — Ambulatory Visit: Payer: BC Managed Care – PPO | Admitting: Cardiovascular Disease

## 2019-05-04 ENCOUNTER — Other Ambulatory Visit: Payer: Self-pay

## 2019-05-04 ENCOUNTER — Encounter: Payer: Self-pay | Admitting: Cardiovascular Disease

## 2019-05-04 VITALS — BP 148/102 | HR 75 | Temp 96.8°F | Ht 65.0 in | Wt 212.0 lb

## 2019-05-04 DIAGNOSIS — E785 Hyperlipidemia, unspecified: Secondary | ICD-10-CM

## 2019-05-04 DIAGNOSIS — R0602 Shortness of breath: Secondary | ICD-10-CM | POA: Diagnosis not present

## 2019-05-04 DIAGNOSIS — I10 Essential (primary) hypertension: Secondary | ICD-10-CM | POA: Diagnosis not present

## 2019-05-04 MED ORDER — LOSARTAN POTASSIUM-HCTZ 100-25 MG PO TABS: 1.0000 | ORAL_TABLET | Freq: Every day | ORAL | 1 refills | Status: DC

## 2019-05-04 NOTE — Patient Instructions (Signed)
Medication Instructions:  INCREASE the Losartan-Hydrochlorothiazide to 100-25 mg once daily  *If you need a refill on your cardiac medications before your next appointment, please call your pharmacy*   Lab Work: None ordered If you have labs (blood work) drawn today and your tests are completely normal, you will receive your results only by: Marland Kitchen MyChart Message (if you have MyChart) OR . A paper copy in the mail If you have any lab test that is abnormal or we need to change your treatment, we will call you to review the results.   Testing/Procedures: Your physician has requested that you have an echocardiogram. Echocardiography is a painless test that uses sound waves to create images of your heart. It provides your doctor with information about the size and shape of your heart and how well your heart's chambers and valves are working. You may receive an ultrasound enhancing agent through an IV if needed to better visualize your heart during the echo.This procedure takes approximately one hour. There are no restrictions for this procedure. This will take place at the [address].   Your physician has requested that you have a renal artery duplex. During this test, an ultrasound is used to evaluate blood flow to the kidneys. Take your medications as you usually do. This will take place at [place], [address].   No food after 11PM the night before.  Water is OK. (Don't drink liquids if you have been instructed not to for ANOTHER test).  Take two Extra-Strength Gas-X capsules at bedtime the night before test.   Take an additional two Extra-Strength Gas-X capsules three (3) hours before the test or first thing in the morning.    Avoid foods that produce bowel gas, for 24 hours prior to exam (see below).    No breakfast, no chewing gum, no smoking or carbonated beverages.  Patient may take morning medications with water.  Come in for test at least 15 minutes early  to register.      Follow-Up: At [facility], you and your health needs are our priority.  As part of our continuing mission to provide you with exceptional heart care, we have created designated Provider Care Teams.  These Care Teams include your primary Cardiologist (physician) and Advanced Practice Providers (APPs -  Physician Assistants and Nurse Practitioners) who all work together to provide you with the care you need, when you need it.  We recommend signing up for the patient portal called "MyChart".  Sign up information is provided on this After Visit Summary.  MyChart is used to connect with patients for Virtual Visits (Telemedicine).  Patients are able to view lab/test results, encounter notes, upcoming appointments, etc.  Non-urgent messages can be sent to your provider as well.   To learn more about what you can do with MyChart, go to [email or web address].    Your next appointment:   6 month(s)  The format for your next appointment:   In Person  Provider:   Kathlyn Sacramento, MD

## 2019-05-04 NOTE — Progress Notes (Signed)
Cardiology Office Note   Date:  05/04/2019   ID:  Francis Cross, DOB [birth year], MRN [id number]  PCP:  [name], MD  Cardiologist:   Kathlyn Sacramento, MD   No chief complaint on file.     History of Present Illness: Francis Cross is a 64 y.o. male who is here today for regarding severe hyperlipidemia and mild LAD plaque noted on cardiac CTA done in 2019. He has known history of hypertension, hyperlipidemia and type 2 diabetes.  Previous nuclear stress test in 2015 was normal.  He has known history of intermittent atypical left-sided chest pains thought to be musculoskeletal. He has known history of severe hyperlipidemia with intolerance to statins and Zetia.    He has been tolerating Repatha.   CTA of the coronary arteries in August 2019 showed a calcium score of 3 with mild soft plaque in the proximal LAD.  Last year, I switch lisinopril to losartan due to dry cough.  His blood pressure continues to be elevated at home especially his diastolic blood pressure which is frequently above 100.  He denies history of sleep apnea and reports no symptoms.  He takes his medications regularly.  He reports increased fatigue and shortness of breath.     Past Medical History:  Diagnosis Date  . Acid reflux   . Allergy   . Arthritis    hands  . Cancer ([place])    skin cancer- basal cell on nose  . Cataract   . Chronic kidney disease    Kidney stones  . Diabetes mellitus without complication ([place])   . Difficult intubation   . Erectile dysfunction   . Fatty liver   . Gout   . Hemochromatosis   . Hiatal hernia   . History of kidney stones   . Hyperlipidemia   . Hypertension   . Iron overload   . Obesity   . Sciatica    bilateral, intermittent    Past Surgical History:  Procedure Laterality Date  . APPENDECTOMY    . BACK SURGERY  09/09/2017   lumbar discectomy   . CARDIAC CATHETERIZATION     [facility]  . CATARACT EXTRACTION W/PHACO Right 12/15/2018   Procedure: CATARACT  EXTRACTION PHACO AND INTRAOCULAR LENS PLACEMENT (IOC) RIGHT DIABETIC 00:35.7  16.3%  5.81;  Surgeon: Birder Robson, MD;  Location: [place];  Service: Ophthalmology;  Laterality: Right;  diabetic - insulin  . CATARACT EXTRACTION W/PHACO Left 01/05/2019   Procedure: CATARACT EXTRACTION PHACO AND INTRAOCULAR LENS PLACEMENT ([place]) LEFT DIABETIC;  Surgeon: Birder Robson, MD;  Location: [place];  Service: Ophthalmology;  Laterality: Left;  0:31 16.4% 5.20  . CERVICAL FUSION    . COLONOSCOPY WITH PROPOFOL N/A 12/22/2018   Procedure: COLONOSCOPY WITH PROPOFOL;  Surgeon: Doran Stabler, MD;  [facility];  Service: Gastroenterology;  Laterality: N/A;  . KIDNEY STONE SURGERY  03/07/2017   laser blast  . LUMBAR LAMINECTOMY/DECOMPRESSION MICRODISCECTOMY N/A 09/23/2017   Procedure: Lumbar Four-Five Redo Discectomy for epidural hematoma.;  Surgeon: Kristeen Miss, MD;  Location: [place];  Service: Neurosurgery;  Laterality: N/A;  . TONSILLECTOMY       Current Outpatient Medications  Medication Sig Dispense Refill  . acetaminophen (TYLENOL) 325 MG tablet Take 650 mg by mouth every 6 (six) hours as needed for mild pain or moderate pain.    . Dulaglutide (TRULICITY) 1.5 0000000 SOPN INJECT 1.5 MG INTO THE SKIN ONCE A WEEK. (Patient taking differently: Inject 1.5 mg into  the skin once a week. Saturday) 6 pen 1  . Evolocumab (REPATHA SURECLICK) XX123456 MG/ML SOAJ Inject 140 mg into the skin every 14 (fourteen) days. 2 pen 12  . Insulin Glargine (BASAGLAR KWIKPEN) 100 UNIT/ML SOPN INJECT 12 UNITS INTO THE SKIN AT BEDTIME, FILL 15TH FOR COUPON TO WORK 5 pen 1  . Insulin Pen Needle (PEN NEEDLES) 32G X 4 MM MISC 1 application by Does not apply route daily. 100 each 3  . ketoconazole (NIZORAL) 2 % shampoo APPLY TOPICALLY 2 (TWO) TIMES A WEEK. (Patient taking differently: Apply 1 application topically 2 (two) times a week. ) 120 mL 1  . ketorolac (ACULAR) 0.5 % ophthalmic solution  Place 1 drop into both eyes 2 (two) times daily as needed. 5 mL 1  . losartan-hydrochlorothiazide (HYZAAR) 50-12.5 MG tablet TAKE 1 TABLET BY MOUTH EVERY DAY (Patient taking differently: Take 1 tablet by mouth daily. ) 90 tablet 1  . omeprazole (PRILOSEC) 20 MG capsule Take 1 capsule (20 mg total) by mouth daily. (Patient taking differently: Take 20 mg by mouth as needed (Heartburn). ) 20 capsule 0  . tamsulosin (FLOMAX) 0.4 MG CAPS capsule Take 0.4 mg by mouth as needed (Kidney stone).     . celecoxib (CELEBREX) 200 MG capsule Take 200 mg by mouth at bedtime.     . Prednisolon-Gatiflox-Bromfenac 1-0.5-0.075 % SOLN Place 1 drop into the right eye See admin instructions. 1st week 4 times a day 2nd week 3 times a day 3rd week 2 times a day 4th week 1 time a day until complete     No current facility-administered medications for this visit.    Allergies:   Keflex [cephalexin] and Penicillins    Social History:  The patient  reports that he has quit smoking. He has quit using smokeless tobacco. He reports previous alcohol use. He reports that he does not use drugs.   Family History:  The patient's family history includes Hyperlipidemia in his sister; Hypertension in his sister; Lung cancer in his father; Melanoma in his mother.    ROS:  Please see the history of present illness.   Otherwise, review of systems are positive for none.   All other systems are reviewed and negative.    PHYSICAL EXAM: VS:  BP (!) 148/102   Pulse 75   Temp (!) 96.8 F (36 C)   Ht 5\' 5"  (1.651 m)   Wt 212 lb (96.2 kg)   SpO2 98%   BMI 35.28 kg/m  , BMI Body mass index is 35.28 kg/m. GEN: Well nourished, well developed, in no acute distress  HEENT: normal  Neck: no JVD, carotid bruits, or masses Cardiac: RRR; no murmurs, rubs, or gallops,no edema  Respiratory:  clear to auscultation bilaterally, normal work of breathing GI: soft, nontender, nondistended, + BS MS: no deformity or atrophy  Skin: warm and  dry, no rash Neuro:  Strength and sensation are intact Psych: euthymic mood, full affect   EKG:  EKG is ordered today. EKG showed normal sinus rhythm with no significant ST or T wave changes.   Recent Labs: 10/16/2018: ALT 32; BUN 14; Creatinine, Ser 0.98; Potassium 4.2; Sodium 143    Lipid Panel    Component Value Date/Time   CHOL 129 06/17/2018 1155   TRIG 290 (H) 06/17/2018 1155   HDL 51 06/17/2018 1155   CHOLHDL 2.5 06/17/2018 1155   CHOLHDL 2.6 11/28/2016 0839   VLDL 29 07/16/2016 1034   LDLCALC 20 06/17/2018 1155  LDLCALC 52 11/28/2016 0839   LDLDIRECT 152.1 10/12/2008 0726      Wt Readings from Last 3 Encounters:  05/04/19 212 lb (96.2 kg)  01/15/19 210 lb (95.3 kg)  01/05/19 207 lb (93.9 kg)       No flowsheet data found.    ASSESSMENT AND PLAN:  1. Mild coronary atherosclerosis: Noted on previous CTA with no obstructive disease.  Continue treatment of risk factors.  Given reported shortness of breath and fatigue, I requested an echocardiogram to evaluate LV systolic/diastolic function.  2. Severe hyperlipidemia: He is tolerating Repatha very well.  Most recent LDL was 20.  Triglyceride was elevated at 290.  I discussed the importance of increased exercise.   3. Essential hypertension: His blood pressure has been elevated in spite of taking losartan hydrochlorothiazide.  His diastolic blood pressure is almost always above 100 even at home.  I think we need to exclude renal artery stenosis and thus I requested renal artery duplex.  He denies symptoms of sleep apnea but we might need to consider evaluation of this.  I also increased losartan-hydrochlorothiazide to 100/25 mg once daily.   Disposition:   FU with me in 6 months.  Signed,  Kathlyn Sacramento, MD  05/04/2019 8:39 AM    [place]

## 2019-05-07 ENCOUNTER — Other Ambulatory Visit (INDEPENDENT_AMBULATORY_CARE_PROVIDER_SITE_OTHER): Payer: BC Managed Care – PPO

## 2019-05-07 DIAGNOSIS — I10 Essential (primary) hypertension: Secondary | ICD-10-CM

## 2019-05-15 ENCOUNTER — Other Ambulatory Visit: Payer: Self-pay | Admitting: Family Medicine

## 2019-05-15 DIAGNOSIS — E1165 Type 2 diabetes mellitus with hyperglycemia: Secondary | ICD-10-CM

## 2019-05-15 NOTE — Telephone Encounter (Signed)
Requested Prescriptions  Pending Prescriptions Disp Refills  . Insulin Glargine (BASAGLAR KWIKPEN) 100 UNIT/ML [Pharmacy Med Name: BASAGLAR 100 UNIT/ML KWIKPEN] 15 pen 1    Sig: INJECT 16 UNITS INTO THE SKIN AT BEDTIME.     Endocrinology:  Diabetes - Insulins Passed - 05/15/2019  9:47 AM      Passed - HBA1C is between 0 and 7.9 and within 180 days    Hgb A1c MFr Bld  Date Value Ref Range Status  01/15/2019 7.7 (H) 4.8 - 5.6 % Final    Comment:             Prediabetes: 5.7 - 6.4          Diabetes: >6.4          Glycemic control for adults with diabetes: <7.0          Passed - Valid encounter within last 6 months    Recent Outpatient Visits          4 months ago Type 2 diabetes mellitus with hyperglycemia, with long-term current use of insulin [facility])   [facility] at [place], MD   7 months ago Type 2 diabetes mellitus with hyperglycemia, with long-term current use of insulin [facility])   [facility] at [place], Ranell Patrick, MD   9 months ago Type 2 diabetes mellitus with hyperglycemia, without long-term current use of insulin [facility])   [facility] at [place], Ranell Patrick, MD   10 months ago Type 2 diabetes mellitus with hyperglycemia, without long-term current use of insulin [facility])   [facility] at [place], Ranell Patrick, MD   10 months ago Type 2 diabetes mellitus with hyperglycemia, without long-term current use of insulin [facility])   Primary [facility] at [place], Ranell Patrick, MD

## 2019-05-17 ENCOUNTER — Other Ambulatory Visit: Payer: Self-pay

## 2019-05-17 ENCOUNTER — Encounter: Payer: Self-pay | Admitting: Family Medicine

## 2019-05-17 ENCOUNTER — Ambulatory Visit (HOSPITAL_COMMUNITY): Payer: BC Managed Care – PPO | Attending: Internal Medicine

## 2019-05-17 DIAGNOSIS — R0602 Shortness of breath: Secondary | ICD-10-CM | POA: Diagnosis present

## 2019-05-19 ENCOUNTER — Ambulatory Visit (HOSPITAL_COMMUNITY)
Admission: RE | Admit: 2019-05-19 | Discharge: 2019-05-19 | Disposition: A | Discharge status: Home / Self Care | Payer: BC Managed Care – PPO | Source: Ambulatory Visit | Attending: Cardiovascular Disease | Admitting: Cardiovascular Disease

## 2019-05-19 ENCOUNTER — Other Ambulatory Visit: Payer: Self-pay

## 2019-05-19 DIAGNOSIS — E785 Hyperlipidemia, unspecified: Secondary | ICD-10-CM | POA: Insufficient documentation

## 2019-05-19 DIAGNOSIS — I10 Essential (primary) hypertension: Secondary | ICD-10-CM | POA: Diagnosis present

## 2019-05-21 ENCOUNTER — Encounter: Payer: Self-pay | Admitting: Family Medicine

## 2019-05-31 ENCOUNTER — Other Ambulatory Visit: Payer: Self-pay | Admitting: Family Medicine

## 2019-05-31 DIAGNOSIS — E1140 Type 2 diabetes mellitus with diabetic neuropathy, unspecified: Secondary | ICD-10-CM

## 2019-05-31 NOTE — Telephone Encounter (Signed)
Requested Prescriptions  Pending Prescriptions Disp Refills  . TRULICITY 1.5 0000000 SOPN [Pharmacy Med Name: TRULICITY 1.5 [id number] ML PEN]  1    Sig: INJECT 1.5 MG INTO THE SKIN ONCE A WEEK.     Endocrinology:  Diabetes - GLP-1 Receptor Agonists Passed - 05/31/2019  1:34 AM      Passed - HBA1C is between 0 and 7.9 and within 180 days    Hgb A1c MFr Bld  Date Value Ref Range Status  01/15/2019 7.7 (H) 4.8 - 5.6 % Final    Comment:             Prediabetes: 5.7 - 6.4          Diabetes: >6.4          Glycemic control for adults with diabetes: <7.0          Passed - Valid encounter within last 6 months    Recent Outpatient Visits          4 months ago Type 2 diabetes mellitus with hyperglycemia, with long-term current use of insulin [facility])   [facility] [place], MD   7 months ago Type 2 diabetes mellitus with hyperglycemia, with long-term current use of insulin [facility])   [facility] [place], Ranell Patrick, MD   9 months ago Type 2 diabetes mellitus with hyperglycemia, without long-term current use of insulin [facility]   [facility] [place], Ranell Patrick, MD   10 months ago Type 2 diabetes mellitus with hyperglycemia, without long-term current use of insulin [facility])   [facility] at [place], Ranell Patrick, MD   11 months ago Type 2 diabetes mellitus with hyperglycemia, without long-term current use of insulin [facility])   [facility] at [place], Ranell Patrick, MD      Future Appointments            In 1 week Carlota Raspberry Ranell Patrick, MD [facility] at [place], [facility]

## 2019-06-07 ENCOUNTER — Encounter: Payer: Self-pay | Admitting: Family Medicine

## 2019-06-07 ENCOUNTER — Other Ambulatory Visit: Payer: Self-pay

## 2019-06-07 ENCOUNTER — Telehealth (INDEPENDENT_AMBULATORY_CARE_PROVIDER_SITE_OTHER): Payer: BC Managed Care – PPO | Admitting: Family Medicine

## 2019-06-07 VITALS — BP 127/85 | HR 98 | Ht 65.0 in | Wt 210.0 lb

## 2019-06-07 DIAGNOSIS — I8391 Asymptomatic varicose veins of right lower extremity: Secondary | ICD-10-CM

## 2019-06-07 DIAGNOSIS — E1140 Type 2 diabetes mellitus with diabetic neuropathy, unspecified: Secondary | ICD-10-CM | POA: Diagnosis not present

## 2019-06-07 DIAGNOSIS — Z1329 Encounter for screening for other suspected endocrine disorder: Secondary | ICD-10-CM

## 2019-06-07 DIAGNOSIS — E1165 Type 2 diabetes mellitus with hyperglycemia: Secondary | ICD-10-CM

## 2019-06-07 DIAGNOSIS — Z125 Encounter for screening for malignant neoplasm of prostate: Secondary | ICD-10-CM | POA: Diagnosis not present

## 2019-06-07 DIAGNOSIS — Z794 Long term (current) use of insulin: Secondary | ICD-10-CM

## 2019-06-07 DIAGNOSIS — E785 Hyperlipidemia, unspecified: Secondary | ICD-10-CM

## 2019-06-07 DIAGNOSIS — R7989 Other specified abnormal findings of blood chemistry: Secondary | ICD-10-CM

## 2019-06-07 MED ORDER — PREGABALIN 50 MG PO CAPS: 50.0000 mg | ORAL_CAPSULE | Freq: Three times a day (TID) | ORAL | 1 refills | Status: DC

## 2019-06-07 NOTE — Progress Notes (Deleted)
Subjective:  Patient ID: Francis Cross, male    DOB: 1955/04/19  Age: 64 y.o. MRN: [id number]  CC:  Chief Complaint  Patient presents with  . vein issue    R leg has a bulging vein on the Knee/ shin. no pain no discomfort. pt states he had damage to this area in the past  . Foot Pain    pain started 6-12 months ago.located in both feet. pain is dull, aching, buring, and stinging. no swelling at this time  . blood work.    pt states his daughter is a NP and would like him to get TSH, CMP, CBC, and a vitamin D.    HPI Francis Cross presents for  Concerns as above.   Right leg bulging vein at the knee/shin. R front of shin.  Bigger past year.  Injury to area 4 years ago.  No pain.   Bilateral foot pain: Burning/stinging.  Past 6 to 12 months.  Treated for peripheral neuropathy with gabapentin, but experienced depression symptoms.  Changed to Lyrica 50 mg 3 times daily.  Foot burning had improved in 2019 when this was last discussed, option of restarting Lyrica if burning returned.  Now having burning in feet again - off and on.  No recent TSH  History of low Vitamin D: Law readings years ago. On 4000units qd.    Diabetes: Complicated by hyperglycemia, diabetic neuropathy.  Last A1c elevated at 7.7 in November 2020.    He was continued on Basaglar 16 units nightly, Trulicity 1.5 mg weekly,  plan for increased activity/exercise, monitoring diet, with repeat testing in 3 months. Had steroid shot in back few months ago.  Home readings: 170-180 in am fasting  147 few hours ago.  Lowest since last visit- 109. No symptomatic lows  Snacks discussed if working outside - has done so at times. No new side effects.  On ARB with losartan hctz On Repatha for HLD.   Microalbumin: Normal ratio in November 2019 Optho, foot exam, pneumovax: Up-to-date  Current BP:125/78.   Lab Results  Component Value Date   HGBA1C 7.7 (H) 01/15/2019   HGBA1C 7.1 (H) 10/16/2018   HGBA1C 9.9 (H)  06/17/2018   Lab Results  Component Value Date   MICROALBUR 0.5 09/21/2015   LDLCALC 20 06/17/2018   CREATININE 0.98 10/16/2018    Requesting PSA - We discussed pros and cons of prostate cancer screening, and after this discussion, he chose to have screening done with PSA.  Hemochromatosis: History of trait.  Lab Results  Component Value Date   WBC 15.7 (H) 09/23/2017   HGB 16.0 09/23/2017   HCT 47.0 09/23/2017   MCV 87.9 09/23/2017   PLT 172 09/23/2017     History Patient Active Problem List   Diagnosis Date Noted  . Special screening for malignant neoplasms, colon   . Difficult intubation   . Herniated nucleus pulposus, L4-5 left 09/23/2017  . Hx of carpal tunnel repair 12/22/2015  . Testosterone deficiency 05/23/2014  . Pain in the chest 10/01/2013  . GOUT, UNSPECIFIED 01/29/2010  . Obesity 12/06/2008  . IRON OVERLOAD 08/11/2007  . HIATAL HERNIA WITH REFLUX 07/08/2006  . Hypercholesteremia 06/12/2006  . HYPERTENSION 06/12/2006  . 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 Acid reflux   . Allergy   . Arthritis    hands  . Cancer ([place])    skin cancer- basal cell on nose  . Cataract   . Chronic kidney  disease    Kidney stones  . Diabetes mellitus without complication ([place])   . Difficult intubation   . Erectile dysfunction   . Fatty liver   . Gout   . Hemochromatosis   . Hiatal hernia   . History of kidney stones   . Hyperlipidemia   . Hypertension   . Iron overload   . Obesity   . Sciatica    bilateral, intermittent   Past Surgical History:  Procedure Laterality Date  . APPENDECTOMY    . BACK SURGERY  09/09/2017   lumbar discectomy   . CARDIAC CATHETERIZATION     [facility]  . CATARACT EXTRACTION W/PHACO Right 12/15/2018   Procedure: CATARACT EXTRACTION PHACO AND INTRAOCULAR LENS PLACEMENT (I[facility]) RIGHT DIABETIC 00:35.7  16.3%  5.81;  Surgeon: Birder Robson, MD;  Location: [place];  Service:  Ophthalmology;  Laterality: Right;  diabetic - insulin  . CATARACT EXTRACTION W/PHACO Left 01/05/2019   Procedure: CATARACT EXTRACTION PHACO AND INTRAOCULAR LENS PLACEMENT ([place]) LEFT DIABETIC;  Surgeon: Birder Robson, MD;  Location: [place];  Service: Ophthalmology;  Laterality: Left;  0:31 16.4% 5.20  . CERVICAL FUSION    . COLONOSCOPY WITH PROPOFOL N/A 12/22/2018   Procedure: COLONOSCOPY WITH PROPOFOL;  Surgeon: Doran Stabler, MD;  Location: [facility];  Service: Gastroenterology;  Laterality: N/A;  . KIDNEY STONE SURGERY  03/07/2017   laser blast  . LUMBAR LAMINECTOMY/DECOMPRESSION MICRODISCECTOMY N/A 09/23/2017   Procedure: Lumbar Four-Five Redo Discectomy for epidural hematoma.;  Surgeon: Kristeen Miss, MD;  Location: [place];  Service: Neurosurgery;  Laterality: N/A;  . TONSILLECTOMY     Allergies  Allergen Reactions  . Keflex [Cephalexin] Rash  . Penicillins Itching, Rash and Other (See Comments)    Has patient had a PCN reaction causing immediate rash, facial/tongue/throat swelling, SOB or lightheadedness with hypotension: No Has patient had a PCN reaction causing severe rash involving mucus membranes or skin necrosis: No Has patient had a PCN reaction that required hospitalization: No Has patient had a PCN reaction occurring within the last 10 years: No If all of the above answers are "NO", then may proceed with Cephalosporin use.    Prior to Admission medications   Medication Sig Start Date End Date Taking? Authorizing Provider  acetaminophen (TYLENOL) 325 MG tablet Take 650 mg by mouth every 6 (six) hours as needed for mild pain or moderate pain.   Yes [provider]  celecoxib (CELEBREX) 200 MG capsule Take 200 mg by mouth at bedtime.  11/18/18  Yes [provider]  Evolocumab (REPATHA SURECLICK) [id number] MG/ML SOAJ Inject 140 mg into the skin every 14 (fourteen) days. 08/20/18  Yes Wellington Hampshire, MD  Insulin Glargine (BASAGLAR KWIKPEN) 100  UNIT/ML INJECT 16 UNITS INTO THE SKIN AT BEDTIME. 05/15/19  Yes [name], MD  Insulin Pen Needle (PEN NEEDLES) 32G X 4 MM MISC 1 application by Does not apply route daily. 06/24/18  Yes [name], MD  ketoconazole (NIZORAL) 2 % shampoo APPLY TOPICALLY 2 (TWO) TIMES A WEEK. Patient taking differently: Apply 1 application topically 2 (two) times a week.  04/11/16  Yes Shawnee Knapp, MD  ketorolac (ACULAR) 0.5 % ophthalmic solution Place 1 drop into both eyes 2 (two) times daily as needed. 04/11/16  Yes Shawnee Knapp, MD  losartan-hydrochlorothiazide (HYZAAR) 100-25 MG tablet Take 1 tablet by mouth daily. 05/04/19  Yes Wellington Hampshire, MD  omeprazole (PRILOSEC) 20 MG capsule Take 1 capsule (20 mg  total) by mouth daily. Patient taking differently: Take 20 mg by mouth as needed (Heartburn).  04/24/16  Yes Danis, Kirke Corin, MD  Prednisolon-Gatiflox-Bromfenac 1-0.5-0.075 % SOLN Place 1 drop into the right eye See admin instructions. 1st week 4 times a day 2nd week 3 times a day 3rd week 2 times a day 4th week 1 time a day until complete   Yes [provider]  tamsulosin (FLOMAX) 0.4 MG CAPS capsule Take 0.4 mg by mouth as needed (Kidney stone).    Yes [provider]  TRULICITY 1.5 0000000 SOPN INJECT 1.5 MG INTO THE SKIN ONCE A WEEK. 05/31/19  Yes [name], MD   Social History   Socioeconomic History  . Marital status: Married    Spouse name: Not on file  . Number of children: 3  . Years of education: Not on file  . Highest education level: Not on file  Occupational History  . Occupation: retired  Tobacco Use  . Smoking status: Former Research scientist (life sciences)  . Smokeless tobacco: Former Systems developer  . Tobacco comment: socially as teenager  Substance and Sexual Activity  . Alcohol use: Not Currently  . Drug use: No  . Sexual activity: Not on file  Other Topics Concern  . Not on file  Social History Narrative  . Not on file   Social Determinants of Health   Financial  Resource Strain:   . Difficulty of Paying Living Expenses:   Food Insecurity:   . Worried About Charity fundraiser in the Last Year:   . Arboriculturist in the Last Year:   Transportation Needs:   . Film/video editor (Medical):   Marland Kitchen Lack of Transportation (Non-Medical):   Physical Activity:   . Days of Exercise per Week:   . Minutes of Exercise per Session:   Stress:   . Feeling of Stress :   Social Connections:   . Frequency of Communication with Friends and Family:   . Frequency of Social Gatherings with Friends and Family:   . Attends Religious Services:   . Active Member of Clubs or Organizations:   . Attends Archivist Meetings:   Marland Kitchen Marital Status:   Intimate Partner Violence:   . Fear of Current or Ex-Partner:   . Emotionally Abused:   Marland Kitchen Physically Abused:   . Sexually Abused:     Review of Systems   Objective:   Vitals:   06/07/19 1632  BP: 127/85  Pulse: 98  Weight: 210 lb (95.3 kg)  Height: 5\' 5"  (1.651 m)     Physical Exam     Assessment & Plan:  Quanta Lachman is a 64 y.o. male . No diagnosis found.   No orders of the defined types were placed in this encounter.  Patient Instructions       If you have lab work done today you will be contacted with your lab results within the next 2 weeks.  If you have not heard from [place] then please contact us. The fastest way to get your results is to register for My Chart.   IF you received an x-ray today, you will receive an invoice from [facility]. Please contact [facility] at [phone] with questions or concerns regarding your invoice.   IF you received labwork today, you will receive an invoice from [facility]. Please contact [facility] at [phone] with questions or concerns regarding your invoice.   Our billing staff will not be able to assist you with  questions regarding bills from these companies.  You will be contacted with the lab results as soon as they are  available. The fastest way to get your results is to activate your My Chart account. Instructions are located on the last page of this paperwork. If you have not heard from [place] regarding the results in 2 weeks, please contact this office.         Signed, [name], MD [facility]

## 2019-06-07 NOTE — Patient Instructions (Addendum)
I will refer you to vein specialist.   No change in medications for now but depending on A1c we may increase the Trulicity.  If we do go up on the medication is very important to eat sufficient calories throughout the day and snacks when working outside to minimize low blood sugar risk.  Lyrica can be used for likely diabetic neuropathy.  I will check your blood counts, thyroid test. Back could be a contributor to that pain but again Lyrica should help pain whether it is from the back or diabetes.  Lab only visit, then recheck with me in 3 months in office.  Good talking to you today.   If you have lab work done today you will be contacted with your lab results within the next 2 weeks.  If you have not heard from [place] then please contact us. The fastest way to get your results is to register for My Chart.   IF you received an x-ray today, you will receive an invoice from [facility]. Please contact [facility] at [phone] with questions or concerns regarding your invoice.   IF you received labwork today, you will receive an invoice from [place]. Please contact [facility] at [phone] with questions or concerns regarding your invoice.   Our billing staff will not be able to assist you with questions regarding bills from these companies.  You will be contacted with the lab results as soon as they are available. The fastest way to get your results is to activate your My Chart account. Instructions are located on the last page of this paperwork. If you have not heard from [place] regarding the results in 2 weeks, please contact this office.

## 2019-06-07 NOTE — Progress Notes (Signed)
Virtual Visit via Video Note  I connected with Francis Cross on 06/07/19 at 6:05 PM by a video enabled telemedicine application and verified that I am speaking with the correct person using two identifiers.   I discussed the limitations, risks, security and privacy concerns of performing an evaluation and management service by telephone and the availability of in person appointments. I also discussed with the patient that there may be a patient responsible charge related to this service. The patient expressed understanding and agreed to proceed, consent obtained.   Initial 15 minutes on video, then some technical difficulty, reverted to phone.  Additional 27 minutes.  Chief complaint:  Chief Complaint  Patient presents with  . vein issue    R leg has a bulging vein on the Knee/ shin. no pain no discomfort. pt states he had damage to this area in the past  . Foot Pain    pain started 6-12 months ago.located in both feet. pain is dull, aching, buring, and stinging. no swelling at this time  . blood work.    pt states his daughter is a NP and would like him to get TSH, CMP, CBC, and a vitamin D.      History of Present Illness: Francis Cross is a 64 y.o. male  HPI Francis Cross presents for  Concerns as above.   Right leg bulging vein at the knee/shin. R front of shin.  Bigger past year.  Injury to area 4 years ago.  No pain.   Bilateral foot pain: Burning/stinging.  Past 6 to 12 months.  Treated for peripheral neuropathy with gabapentin, but experienced depression symptoms.  Changed to Lyrica 50 mg 3 times daily.  Foot burning had improved in 2019 when this was last discussed, option of restarting Lyrica if burning returned.  Now having burning in feet again - off and on.  No recent TSH  History of low Vitamin D: Low readings years ago. On 4000 units qd.    Diabetes: Complicated by hyperglycemia, diabetic neuropathy.  Last A1c elevated at 7.7 in November 2020.    He was  continued on Basaglar 16 units nightly, Trulicity 1.5 mg weekly,  plan for increased activity/exercise, monitoring diet, with repeat testing in 3 months. Had steroid shot in back few months ago.  Home readings: 170-180 in am fasting  147 few hours ago.  Lowest since last visit- 109. No symptomatic lows  Snacks discussed if working outside - has done so at times. No new side effects.  On ARB with losartan hctz On Repatha for HLD.   Microalbumin: Normal ratio in November 2019 Optho, foot exam, pneumovax: Up-to-date  Current BP:125/78.   Lab Results  Component Value Date   HGBA1C 7.7 (H) 01/15/2019   HGBA1C 7.1 (H) 10/16/2018   HGBA1C 9.9 (H) 06/17/2018   Lab Results  Component Value Date   MICROALBUR 0.5 09/21/2015   LDLCALC 20 06/17/2018   CREATININE 0.98 10/16/2018    Requesting PSA - We discussed pros and cons of prostate cancer screening, and after this discussion, he chose to have screening done with PSA.  Hemochromatosis: History of trait.  Updated CBC requested.  Has not had to donate blood recently. Lab Results  Component Value Date   WBC 15.7 (H) 09/23/2017   HGB 16.0 09/23/2017   HCT 47.0 09/23/2017   MCV 87.9 09/23/2017   PLT 172 09/23/2017    Patient Active Problem List   Diagnosis Date Noted  . Special screening for malignant neoplasms, colon   .  Difficult intubation   . Herniated nucleus pulposus, L4-5 left 09/23/2017  . Hx of carpal tunnel repair 12/22/2015  . Testosterone deficiency 05/23/2014  . Pain in the chest 10/01/2013  . GOUT, UNSPECIFIED 01/29/2010  . Obesity 12/06/2008  . IRON OVERLOAD 08/11/2007  . HIATAL HERNIA WITH REFLUX 07/08/2006  . Hypercholesteremia 06/12/2006  . HYPERTENSION 06/12/2006  . 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 Acid reflux   . Allergy   . Arthritis    hands  . Cancer ([place])    skin cancer- basal cell on nose  . Cataract   . Chronic kidney disease    Kidney  stones  . Diabetes mellitus without complication ([place])   . Difficult intubation   . Erectile dysfunction   . Fatty liver   . Gout   . Hemochromatosis   . Hiatal hernia   . History of kidney stones   . Hyperlipidemia   . Hypertension   . Iron overload   . Obesity   . Sciatica    bilateral, intermittent   Past Surgical History:  Procedure Laterality Date  . APPENDECTOMY    . BACK SURGERY  09/09/2017   lumbar discectomy   . CARDIAC CATHETERIZATION     [facility]  . CATARACT EXTRACTION W/PHACO Right 12/15/2018   Procedure: CATARACT EXTRACTION PHACO AND INTRAOCULAR LENS PLACEMENT (IOC) RIGHT DIABETIC 00:35.7  16.3%  5.81;  Surgeon: Birder Robson, MD;  Location: [facility];  Service: Ophthalmology;  Laterality: Right;  diabetic - insulin  . CATARACT EXTRACTION W/PHACO Left 01/05/2019   Procedure: CATARACT EXTRACTION PHACO AND INTRAOCULAR LENS PLACEMENT ([place]) LEFT DIABETIC;  Surgeon: Birder Robson, MD;  Location: [facility];  Service: Ophthalmology;  Laterality: Left;  0:31 16.4% 5.20  . CERVICAL FUSION    . COLONOSCOPY WITH PROPOFOL N/A 12/22/2018   Procedure: COLONOSCOPY WITH PROPOFOL;  Surgeon: Doran Stabler, MD;  [facility];  Service: Gastroenterology;  Laterality: N/A;  . KIDNEY STONE SURGERY  03/07/2017   laser blast  . LUMBAR LAMINECTOMY/DECOMPRESSION MICRODISCECTOMY N/A 09/23/2017   Procedure: Lumbar Four-Five Redo Discectomy for epidural hematoma.;  Surgeon: Kristeen Miss, MD;  Location: [facility];  Service: Neurosurgery;  Laterality: N/A;  . TONSILLECTOMY     Allergies  Allergen Reactions  . Keflex [Cephalexin] Rash  . Penicillins Itching, Rash and Other (See Comments)    Has patient had a PCN reaction causing immediate rash, facial/tongue/throat swelling, SOB or lightheadedness with hypotension: No Has patient had a PCN reaction causing severe rash involving mucus membranes or skin necrosis: No Has patient had a PCN reaction that required  hospitalization: No Has patient had a PCN reaction occurring within the last 10 years: No If all of the above answers are "NO", then may proceed with Cephalosporin use.    Prior to Admission medications   Medication Sig Start Date End Date Taking? Authorizing Provider  acetaminophen (TYLENOL) 325 MG tablet Take 650 mg by mouth every 6 (six) hours as needed for mild pain or moderate pain.   Yes [provider]  celecoxib (CELEBREX) 200 MG capsule Take 200 mg by mouth at bedtime.  11/18/18  Yes [provider]  Evolocumab (REPATHA SURECLICK) XX123456 MG/ML SOAJ Inject 140 mg into the skin every 14 (fourteen) days. 08/20/18  Yes Wellington Hampshire, MD  Insulin Glargine (BASAGLAR KWIKPEN) 100 UNIT/ML INJECT 16 UNITS INTO THE SKIN AT BEDTIME. 05/15/19  Yes [name], MD  Insulin Pen Needle (  PEN NEEDLES) 32G X 4 MM MISC 1 application by Does not apply route daily. 06/24/18  Yes [name], MD  ketoconazole (NIZORAL) 2 % shampoo APPLY TOPICALLY 2 (TWO) TIMES A WEEK. Patient taking differently: Apply 1 application topically 2 (two) times a week.  04/11/16  Yes Shawnee Knapp, MD  ketorolac (ACULAR) 0.5 % ophthalmic solution Place 1 drop into both eyes 2 (two) times daily as needed. 04/11/16  Yes Shawnee Knapp, MD  losartan-hydrochlorothiazide (HYZAAR) 100-25 MG tablet Take 1 tablet by mouth daily. 05/04/19  Yes Wellington Hampshire, MD  omeprazole (PRILOSEC) 20 MG capsule Take 1 capsule (20 mg total) by mouth daily. Patient taking differently: Take 20 mg by mouth as needed (Heartburn).  04/24/16  Yes Danis, Kirke Corin, MD  Prednisolon-Gatiflox-Bromfenac 1-0.5-0.075 % SOLN Place 1 drop into the right eye See admin instructions. 1st week 4 times a Cross 2nd week 3 times a Cross 3rd week 2 times a Cross 4th week 1 time a Cross until complete   Yes [provider]  tamsulosin (FLOMAX) 0.4 MG CAPS capsule Take 0.4 mg by mouth as needed (Kidney stone).    Yes [provider]  TRULICITY  1.5 0000000 SOPN INJECT 1.5 MG INTO THE SKIN ONCE A WEEK. 05/31/19  Yes [name], MD  pregabalin (LYRICA) 50 MG capsule Take 1 capsule (50 mg total) by mouth 3 (three) times daily. 06/07/19   [name], MD   Social History   Socioeconomic History  . Marital status: Married    Spouse name: Not on file  . Number of children: 3  . Years of education: Not on file  . Highest education level: Not on file  Occupational History  . Occupation: retired  Tobacco Use  . Smoking status: Former Research scientist (life sciences)  . Smokeless tobacco: Former Systems developer  . Tobacco comment: socially as teenager  Substance and Sexual Activity  . Alcohol use: Not Currently  . Drug use: No  . Sexual activity: Not on file  Other Topics Concern  . Not on file  Social History Narrative  . Not on file   Social Determinants of Health   Financial Resource Strain:   . Difficulty of Paying Living Expenses:   Food Insecurity:   . Worried About Charity fundraiser in the Last Year:   . Arboriculturist in the Last Year:   Transportation Needs:   . Film/video editor (Medical):   Marland Kitchen Lack of Transportation (Non-Medical):   Physical Activity:   . Days of Exercise per Week:   . Minutes of Exercise per Session:   Stress:   . Feeling of Stress :   Social Connections:   . Frequency of Communication with Friends and Family:   . Frequency of Social Gatherings with Friends and Family:   . Attends Religious Services:   . Active Member of Clubs or Organizations:   . Attends Archivist Meetings:   Marland Kitchen Marital Status:   Intimate Partner Violence:   . Fear of Current or Ex-Partner:   . Emotionally Abused:   Marland Kitchen Physically Abused:   . Sexually Abused:     Observations/Objective: Vitals:   06/07/19 1632  BP: 127/85  Pulse: 98  Weight: 210 lb (95.3 kg)  Height: 5\' 5"  (1.651 m)  No distress on video.  Appropriate responses.  Normal respiratory effort.  Nontoxic-appearing.  Right anterior shin over video, some  difficulty with detail, but appears to have varicose vein  at the mid anterior shin without surrounding erythema.   Assessment and Plan: Varicose veins of right lower extremity, unspecified whether complicated - Plan: Ambulatory referral to Vascular Surgery  -Offered in office evaluation, but requested to have seen by vascular specialist.  I suspect this may be an enlarged varicose vein, but will have vascular specialist evaluate further.  Referral placed.  Type 2 diabetes mellitus with hyperglycemia, with long-term current use of insulin (HCC) - Plan: Hemoglobin A1c, Comprehensive metabolic panel, Microalbumin / creatinine urine ratio  -Uncontrolled by last A1c, still elevated readings.  No true symptomatic lows.  Would consider higher dose of Trulicity versus Basaglar, with close monitoring for hypoglycemia, snacks during work outside.  A1c pending.  Check urine microalbumin at lab only visit as well.  Type 2 diabetes mellitus with diabetic neuropathy, without long-term current use of insulin (HCC) - Plan: pregabalin (LYRICA) 50 MG capsule  -Plantar neuropathic symptoms likely related to diabetes, could have component of his lumbar spine disease as well.  Less likely thyroid but will check TSH.  Trial of Lyrica, did not tolerate gabapentin in the past.  Screening for prostate cancer - Plan: PSA  - We discussed pros and cons of prostate cancer screening, and after this discussion, he chose to have screening done with PSA, limitations without DRE discussed.  Hemochromatosis, unspecified hemochromatosis type - Plan: CBC  -History of trait.  Check CBC  Screening for thyroid disorder - Plan: TSH  Low vitamin D level - Plan: VITAMIN D 25 Hydroxy (Vit-D Deficiency, Fractures)  -Recent readings have looked okay, reports low years ago.  Currently on 4000 units/Cross, check updated level.  Hyperlipidemia, unspecified hyperlipidemia type - Plan: Lipid panel  -Check lipids, but continue Repatha,  follow-up with cardiology/lipid specialist  Follow Up Instructions:  Lab only visit, then 68-month in person..   I discussed the assessment and treatment plan with the patient. The patient was provided an opportunity to ask questions and all were answered. The patient agreed with the plan and demonstrated an understanding of the instructions.   The patient was advised to call back or seek an in-person evaluation if the symptoms worsen or if the condition fails to improve as anticipated.  I provided 42 minutes of non-face-to-face time during this encounter.   [name], MD

## 2019-06-14 ENCOUNTER — Ambulatory Visit (INDEPENDENT_AMBULATORY_CARE_PROVIDER_SITE_OTHER): Payer: BC Managed Care – PPO | Admitting: Family Medicine

## 2019-06-14 ENCOUNTER — Other Ambulatory Visit: Payer: Self-pay | Admitting: Cardiovascular Disease

## 2019-06-14 ENCOUNTER — Other Ambulatory Visit: Payer: Self-pay

## 2019-06-14 ENCOUNTER — Ambulatory Visit: Payer: BC Managed Care – PPO | Admitting: Family Medicine

## 2019-06-14 DIAGNOSIS — Z125 Encounter for screening for malignant neoplasm of prostate: Secondary | ICD-10-CM

## 2019-06-14 DIAGNOSIS — Z1329 Encounter for screening for other suspected endocrine disorder: Secondary | ICD-10-CM

## 2019-06-14 DIAGNOSIS — E785 Hyperlipidemia, unspecified: Secondary | ICD-10-CM

## 2019-06-14 DIAGNOSIS — R7989 Other specified abnormal findings of blood chemistry: Secondary | ICD-10-CM

## 2019-06-14 DIAGNOSIS — E1165 Type 2 diabetes mellitus with hyperglycemia: Secondary | ICD-10-CM

## 2019-06-14 DIAGNOSIS — Z794 Long term (current) use of insulin: Secondary | ICD-10-CM

## 2019-06-15 LAB — COMPREHENSIVE METABOLIC PANEL
ALT: 43 IU/L (ref 0–44)
AST: 30 IU/L (ref 0–40)
Albumin/Globulin Ratio: 2.4 — ABNORMAL HIGH (ref 1.2–2.2)
Albumin: 4.5 g/dL (ref 3.8–4.8)
Alkaline Phosphatase: 70 IU/L (ref 39–117)
BUN/Creatinine Ratio: 20 (ref 10–24)
BUN: 23 mg/dL (ref 8–27)
Bilirubin Total: 0.5 mg/dL (ref 0.0–1.2)
CO2: 21 mmol/L (ref 20–29)
Calcium: 9.4 mg/dL (ref 8.6–10.2)
Chloride: 100 mmol/L (ref 96–106)
Creatinine, Ser: 1.13 mg/dL (ref 0.76–1.27)
GFR calc Af Amer: 80 mL/min/{1.73_m2} (ref >59)
GFR calc non Af Amer: 69 mL/min/{1.73_m2} (ref >59)
Globulin, Total: 1.9 g/dL (ref 1.5–4.5)
Glucose: 169 mg/dL — ABNORMAL HIGH (ref 65–99)
Potassium: 3.8 mmol/L (ref 3.5–5.2)
Sodium: 139 mmol/L (ref 134–144)
Total Protein: 6.4 g/dL (ref 6.0–8.5)

## 2019-06-15 LAB — MICROALBUMIN / CREATININE URINE RATIO
Creatinine, Urine: 71.8 mg/dL
Microalb/Creat Ratio: 5 mg/g creat (ref 0–29)
Microalbumin, Urine: 3.5 ug/mL

## 2019-06-15 LAB — TSH: TSH: 2.17 u[IU]/mL (ref 0.450–4.500)

## 2019-06-15 LAB — LIPID PANEL
Chol/HDL Ratio: 2.6 ratio (ref 0.0–5.0)
Cholesterol, Total: 124 mg/dL (ref 100–199)
HDL: 48 mg/dL (ref >39)
LDL Chol Calc (NIH): 48 mg/dL (ref 0–99)
Triglycerides: 166 mg/dL — ABNORMAL HIGH (ref 0–149)
VLDL Cholesterol Cal: 28 mg/dL (ref 5–40)

## 2019-06-15 LAB — PSA: Prostate Specific Ag, Serum: 0.6 ng/mL (ref 0.0–4.0)

## 2019-06-15 LAB — CBC
Hematocrit: 43.3 % (ref 37.5–51.0)
Hemoglobin: 14.9 g/dL (ref 13.0–17.7)
MCH: 31.2 pg (ref 26.6–33.0)
MCHC: 34.4 g/dL (ref 31.5–35.7)
MCV: 91 fL (ref 79–97)
Platelets: 184 10*3/uL (ref 150–450)
RBC: 4.78 x10E6/uL (ref 4.14–5.80)
RDW: 12.6 % (ref 11.6–15.4)
WBC: 7.6 10*3/uL (ref 3.4–10.8)

## 2019-06-15 LAB — VITAMIN D 25 HYDROXY (VIT D DEFICIENCY, FRACTURES): Vit D, 25-Hydroxy: 36.8 ng/mL (ref 30.0–100.0)

## 2019-06-15 LAB — HEMOGLOBIN A1C
Est. average glucose Bld gHb Est-mCnc: 206 mg/dL
Hgb A1c MFr Bld: 8.8 % — ABNORMAL HIGH (ref 4.8–5.6)

## 2019-06-17 ENCOUNTER — Ambulatory Visit: Payer: BC Managed Care – PPO | Admitting: Cardiovascular Disease

## 2019-06-21 ENCOUNTER — Other Ambulatory Visit (INDEPENDENT_AMBULATORY_CARE_PROVIDER_SITE_OTHER): Payer: Self-pay | Admitting: Nurse Practitioner

## 2019-06-21 DIAGNOSIS — I8391 Asymptomatic varicose veins of right lower extremity: Secondary | ICD-10-CM

## 2019-06-22 ENCOUNTER — Encounter: Payer: Self-pay | Admitting: Family Medicine

## 2019-06-23 ENCOUNTER — Other Ambulatory Visit: Payer: Self-pay

## 2019-06-23 ENCOUNTER — Encounter: Payer: Self-pay | Admitting: Family Medicine

## 2019-06-23 ENCOUNTER — Ambulatory Visit (INDEPENDENT_AMBULATORY_CARE_PROVIDER_SITE_OTHER): Payer: BC Managed Care – PPO | Admitting: Nurse Practitioner

## 2019-06-23 ENCOUNTER — Ambulatory Visit (INDEPENDENT_AMBULATORY_CARE_PROVIDER_SITE_OTHER): Payer: BC Managed Care – PPO

## 2019-06-23 ENCOUNTER — Encounter (INDEPENDENT_AMBULATORY_CARE_PROVIDER_SITE_OTHER): Payer: Self-pay | Admitting: Nurse Practitioner

## 2019-06-23 VITALS — BP 131/82 | HR 70 | Resp 16 | Ht 65.5 in | Wt 209.0 lb

## 2019-06-23 DIAGNOSIS — M79604 Pain in right leg: Secondary | ICD-10-CM | POA: Diagnosis not present

## 2019-06-23 DIAGNOSIS — E7800 Pure hypercholesterolemia, unspecified: Secondary | ICD-10-CM | POA: Diagnosis not present

## 2019-06-23 DIAGNOSIS — I8391 Asymptomatic varicose veins of right lower extremity: Secondary | ICD-10-CM

## 2019-06-23 DIAGNOSIS — I10 Essential (primary) hypertension: Secondary | ICD-10-CM

## 2019-06-23 DIAGNOSIS — M79605 Pain in left leg: Secondary | ICD-10-CM

## 2019-06-23 DIAGNOSIS — H5310 Unspecified subjective visual disturbances: Secondary | ICD-10-CM

## 2019-06-23 DIAGNOSIS — E1140 Type 2 diabetes mellitus with diabetic neuropathy, unspecified: Secondary | ICD-10-CM

## 2019-06-24 ENCOUNTER — Encounter (INDEPENDENT_AMBULATORY_CARE_PROVIDER_SITE_OTHER): Payer: Self-pay | Admitting: Nurse Practitioner

## 2019-06-24 MED ORDER — TRULICITY 3 MG/0.5ML ~~LOC~~ SOAJ: 3.0000 mg | SUBCUTANEOUS | 1 refills | Status: DC

## 2019-06-24 NOTE — Telephone Encounter (Signed)
Done

## 2019-06-24 NOTE — Telephone Encounter (Addendum)
Pt is agreeable with the increased Trulicity prescription, [facility]   [place] dr Lorina Rabon

## 2019-06-24 NOTE — Progress Notes (Addendum)
Subjective:    Patient ID: Francis Cross, male    DOB: Jan 29, 1956, 64 y.o.   MRN: [id number] Chief Complaint  Patient presents with  . New Patient (Initial Visit)    ref Carlota Raspberry varicose veins    Today the patient presents as a new patient referral from Dr. Nyoka Cowden, his PCP with concern for possible varicose veins.  The patient was doing yard work several years ago which resulted in injury to his right lower extremity.  In the area where his leg was injured is where the patient has a prominent bulge that is concerning for varicosity.  Is noted that the bulge is not painful for the patient.  It does not grow in size with prolonged standing or dependent positions.  However it does change with muscle flexion.  The wound is completely healed as this was several years ago.  The patient also has several other concerns.  The patient reports having pain in his bilateral lower extremities with ambulation.  He describes it as a burning sensation and the pain goes in his feet as well.  There is also a numb sensation in his bilateral feet.  The patient does have a previous history of sciatic nerve pain in addition to neuropathy however some of his symptoms are concerning for claudication as a happen predictably with exercise and stop with rest.  The patient has several atherosclerotic risk factors in addition to hypertension, hyperlipidemia, diabetes in addition to a previous history of tobacco use although it has been well over 30 years since he stopped.  Patient has a history of mild atherosclerotic disease and so there is concern first some other atherosclerotic issues.  The patient recently had a renal artery duplex done through his cardiologist which revealed his lower abdominal aorta measures at 2.1 cm.  The study was done on 05/19/2019.  The patient has also had cataract surgery however he still has some persistent floaters and some visual changes.  He denies any amaurosis fugax or TIA-like symptoms.  He has not  had any recent CVA.  Today the patient underwent a right lower extremity venous reflux study which showed no evidence of DVT in the right lower extremity.  There is no evidence of superficial venous thrombosis in the right lower extremity.  There is no evidence of venous reflux seen in either the deep or superficial system of the right lower extremity. There was no vascular involvement seen within the area of concern.   Review of Systems  Eyes: Positive for visual disturbance.  Musculoskeletal: Positive for arthralgias.  Neurological: Positive for numbness. Negative for dizziness, facial asymmetry, speech difficulty and weakness.  All other systems reviewed and are negative.      Objective:   Physical Exam Vitals reviewed.  Constitutional:      Appearance: Normal appearance.  Neck:     Vascular: No carotid bruit.  Cardiovascular:     Rate and Rhythm: Normal rate and regular rhythm.     Pulses:          Dorsalis pedis pulses are 2+ on the right side and 1+ on the left side.       Posterior tibial pulses are 2+ on the right side and 2+ on the left side.     Heart sounds: Normal heart sounds.  Pulmonary:     Effort: Pulmonary effort is normal.     Breath sounds: Normal breath sounds.  Musculoskeletal:        General: Normal range of motion.  Skin:    General: Skin is warm and dry.  Neurological:     Mental Status: He is alert.  Psychiatric:        Mood and Affect: Mood normal.        Behavior: Behavior normal.        Thought Content: Thought content normal.        Judgment: Judgment normal.     BP 131/82 (BP Location: Right Arm)   Pulse 70   Resp 16   Ht 5' 5.5" (1.664 m)   Wt 209 lb (94.8 kg)   BMI 34.25 kg/m   Past Medical History:  Diagnosis Date  . Acid reflux   . Allergy   . Arthritis    hands  . Cancer ([place])    skin cancer- basal cell on nose  . Cataract   . Chronic kidney disease    Kidney stones  . Diabetes mellitus without complication ([place])   .  Difficult intubation   . Erectile dysfunction   . Fatty liver   . Gout   . Hemochromatosis   . Hiatal hernia   . History of kidney stones   . Hyperlipidemia   . Hypertension   . Iron overload   . Obesity   . Sciatica    bilateral, intermittent    Social History   Socioeconomic History  . Marital status: Married    Spouse name: Not on file  . Number of children: 3  . Years of education: Not on file  . Highest education level: Not on file  Occupational History  . Occupation: retired  Tobacco Use  . Smoking status: Former Research scientist (life sciences)  . Smokeless tobacco: Former Systems developer  . Tobacco comment: socially as teenager  Substance and Sexual Activity  . Alcohol use: Not Currently  . Drug use: No  . Sexual activity: Not on file  Other Topics Concern  . Not on file  Social History Narrative  . Not on file   Social Determinants of Health   Financial Resource Strain:   . Difficulty of Paying Living Expenses:   Food Insecurity:   . Worried About Charity fundraiser in the Last Year:   . Arboriculturist in the Last Year:   Transportation Needs:   . Film/video editor (Medical):   Marland Kitchen Lack of Transportation (Non-Medical):   Physical Activity:   . Days of Exercise per Week:   . Minutes of Exercise per Session:   Stress:   . Feeling of Stress :   Social Connections:   . Frequency of Communication with Friends and Family:   . Frequency of Social Gatherings with Friends and Family:   . Attends Religious Services:   . Active Member of Clubs or Organizations:   . Attends Archivist Meetings:   Marland Kitchen Marital Status:   Intimate Partner Violence:   . Fear of Current or Ex-Partner:   . Emotionally Abused:   Marland Kitchen Physically Abused:   . Sexually Abused:     Past Surgical History:  Procedure Laterality Date  . APPENDECTOMY    . BACK SURGERY  09/09/2017   lumbar discectomy   . CARDIAC CATHETERIZATION     [facility]  . CATARACT EXTRACTION W/PHACO Right 12/15/2018   Procedure: CATARACT  EXTRACTION PHACO AND INTRAOCULAR LENS PLACEMENT ([facility]) RIGHT DIABETIC 00:35.7  16.3%  5.81;  Surgeon: Birder Robson, MD;  Location: [facility];  Service: Ophthalmology;  Laterality: Right;  diabetic - insulin  . CATARACT EXTRACTION  W/PHACO Left 01/05/2019   Procedure: CATARACT EXTRACTION PHACO AND INTRAOCULAR LENS PLACEMENT ([facility]) LEFT DIABETIC;  Surgeon: Birder Robson, MD;  Location: [facility];  Service: Ophthalmology;  Laterality: Left;  0:31 16.4% 5.20  . CERVICAL FUSION    . COLONOSCOPY WITH PROPOFOL N/A 12/22/2018   Procedure: COLONOSCOPY WITH PROPOFOL;  Surgeon: Doran Stabler, MD;  [facility];  Service: Gastroenterology;  Laterality: N/A;  . KIDNEY STONE SURGERY  03/07/2017   laser blast  . LUMBAR LAMINECTOMY/DECOMPRESSION MICRODISCECTOMY N/A 09/23/2017   Procedure: Lumbar Four-Five Redo Discectomy for epidural hematoma.;  Surgeon: Kristeen Miss, MD;  Location: [facility];  Service: Neurosurgery;  Laterality: N/A;  . TONSILLECTOMY      Family History  Problem Relation Age of Onset  . Lung cancer Father   . Melanoma Mother   . Hyperlipidemia Sister   . Hypertension Sister   . Colon cancer Neg Hx   . Stomach cancer Neg Hx   . Rectal cancer Neg Hx   . Esophageal cancer Neg Hx   . Liver cancer Neg Hx     Allergies  Allergen Reactions  . Keflex [Cephalexin] Rash  . Penicillins Itching, Rash and Other (See Comments)    Has patient had a PCN reaction causing immediate rash, facial/tongue/throat swelling, SOB or lightheadedness with hypotension: No Has patient had a PCN reaction causing severe rash involving mucus membranes or skin necrosis: No Has patient had a PCN reaction that required hospitalization: No Has patient had a PCN reaction occurring within the last 10 years: No If all of the above answers are "NO", then may proceed with Cephalosporin use.        Assessment & Plan:   1. Essential hypertension Good blood pressure control is  essential for slowing atherosclerotic disease progression. Blood pressure doing well today. Patient on appropriate medications no changes needed today.  2. Hypercholesteremia Good lipid control is essential for slowing atherosclerotic disease progression. Patient is on appropriate medications. No changes needed today.  3. Pain in both lower extremities Today the patient has no evidence of varicose veins. The bulging that is seen in his leg is related to muscle movement in the area of his previous injury because it was a penetrating injury I suspect that it is related to damage to the connective tissue that overlies the muscle. This is not causing the patient any pain or discomfort. The primary care physician will continue to follow.  In regards to the other discomfort in the patient's lower extremities patient does have a number of risk factors concerning for atherosclerotic disease. However this could also be related to his back and neuropathy. It is prudent to rule out any arterial involvement with ABIs. The patient will return at his convenience for noninvasive studies. - VAS [place] ABI WITH/WO TBI; Future  4. Subjective vision disturbance The patient does not describe symptoms concerning for any sort of amaurosis fugax or TIA-like symptoms however given the patient's mild coronary disease in addition to his other atherosclerotic risk factors. It would be prudent to rule out any possible carotid involvement. He will return at his convenience for these noninvasive studies. - VAS US CAROTID; Future   Current Outpatient Medications on File Prior to Visit  Medication Sig Dispense Refill  . acetaminophen (TYLENOL) 325 MG tablet Take 650 mg by mouth every 6 (six) hours as needed for mild pain or moderate pain.    . Evolocumab (REPATHA SURECLICK) [id number] MG/ML SOAJ Inject 140 mg into the skin every  14 (fourteen) days. 2 pen 12  . Insulin Glargine (BASAGLAR KWIKPEN) 100 UNIT/ML INJECT 16 UNITS INTO THE SKIN  AT BEDTIME. 15 pen 1  . Insulin Pen Needle (PEN NEEDLES) 32G X 4 MM MISC 1 application by Does not apply route daily. 100 each 3  . ketoconazole (NIZORAL) 2 % shampoo APPLY TOPICALLY 2 (TWO) TIMES A WEEK. (Patient taking differently: Apply 1 application topically 2 (two) times a week. ) 120 mL 1  . ketorolac (ACULAR) 0.5 % ophthalmic solution Place 1 drop into both eyes 2 (two) times daily as needed. 5 mL 1  . losartan-hydrochlorothiazide (HYZAAR) 100-25 MG tablet Take 1 tablet by mouth daily. 90 tablet 1  . omeprazole (PRILOSEC) 20 MG capsule Take 1 capsule (20 mg total) by mouth daily. (Patient taking differently: Take 20 mg by mouth as needed (Heartburn). ) 20 capsule 0  . Prednisolon-Gatiflox-Bromfenac 1-0.5-0.075 % SOLN Place 1 drop into the right eye See admin instructions. 1st week 4 times a day 2nd week 3 times a day 3rd week 2 times a day 4th week 1 time a day until complete    . pregabalin (LYRICA) 50 MG capsule Take 1 capsule (50 mg total) by mouth 3 (three) times daily. 90 capsule 1  . tamsulosin (FLOMAX) 0.4 MG CAPS capsule Take 0.4 mg by mouth as needed (Kidney stone).     . celecoxib (CELEBREX) 200 MG capsule Take 200 mg by mouth at bedtime.      No current facility-administered medications on file prior to visit.    There are no Patient Instructions on file for this visit. No follow-ups on file.   Kris Hartmann, NP

## 2019-06-24 NOTE — Telephone Encounter (Deleted)
[facility] [address] in [place]

## 2019-06-25 ENCOUNTER — Encounter (INDEPENDENT_AMBULATORY_CARE_PROVIDER_SITE_OTHER): Payer: Self-pay

## 2019-07-08 ENCOUNTER — Other Ambulatory Visit: Payer: Self-pay | Admitting: Cardiovascular Disease

## 2019-07-15 ENCOUNTER — Encounter (INDEPENDENT_AMBULATORY_CARE_PROVIDER_SITE_OTHER): Payer: BC Managed Care – PPO

## 2019-07-15 ENCOUNTER — Ambulatory Visit (INDEPENDENT_AMBULATORY_CARE_PROVIDER_SITE_OTHER): Payer: BC Managed Care – PPO | Admitting: Vascular Surgery

## 2019-07-27 ENCOUNTER — Ambulatory Visit (INDEPENDENT_AMBULATORY_CARE_PROVIDER_SITE_OTHER): Payer: BC Managed Care – PPO

## 2019-07-27 ENCOUNTER — Other Ambulatory Visit: Payer: Self-pay

## 2019-07-27 ENCOUNTER — Encounter (INDEPENDENT_AMBULATORY_CARE_PROVIDER_SITE_OTHER): Payer: Self-pay

## 2019-07-27 ENCOUNTER — Ambulatory Visit (INDEPENDENT_AMBULATORY_CARE_PROVIDER_SITE_OTHER): Payer: BC Managed Care – PPO | Admitting: Vascular Surgery

## 2019-07-27 ENCOUNTER — Encounter (INDEPENDENT_AMBULATORY_CARE_PROVIDER_SITE_OTHER): Payer: Self-pay | Admitting: Vascular Surgery

## 2019-07-27 VITALS — BP 133/97 | HR 90 | Ht 65.0 in | Wt 206.0 lb

## 2019-07-27 DIAGNOSIS — H5310 Unspecified subjective visual disturbances: Secondary | ICD-10-CM | POA: Diagnosis not present

## 2019-07-27 DIAGNOSIS — I6529 Occlusion and stenosis of unspecified carotid artery: Secondary | ICD-10-CM | POA: Insufficient documentation

## 2019-07-27 DIAGNOSIS — M79609 Pain in unspecified limb: Secondary | ICD-10-CM | POA: Insufficient documentation

## 2019-07-27 DIAGNOSIS — I6523 Occlusion and stenosis of bilateral carotid arteries: Secondary | ICD-10-CM

## 2019-07-27 DIAGNOSIS — R519 Headache, unspecified: Secondary | ICD-10-CM | POA: Diagnosis not present

## 2019-07-27 DIAGNOSIS — E7800 Pure hypercholesterolemia, unspecified: Secondary | ICD-10-CM | POA: Diagnosis not present

## 2019-07-27 DIAGNOSIS — G8929 Other chronic pain: Secondary | ICD-10-CM

## 2019-07-27 DIAGNOSIS — M79604 Pain in right leg: Secondary | ICD-10-CM | POA: Diagnosis not present

## 2019-07-27 DIAGNOSIS — M79605 Pain in left leg: Secondary | ICD-10-CM

## 2019-07-27 DIAGNOSIS — I10 Essential (primary) hypertension: Secondary | ICD-10-CM | POA: Diagnosis not present

## 2019-07-27 NOTE — Assessment & Plan Note (Signed)
blood pressure control important in reducing the progression of atherosclerotic disease. On appropriate oral medications.  

## 2019-07-27 NOTE — Assessment & Plan Note (Signed)
ABIs today were normal with triphasic waveforms and ABIs of 1.2 on each side.  Digit pressures and waveforms were normal.  No lower extremity arterial insufficiency is present.

## 2019-07-27 NOTE — Assessment & Plan Note (Signed)
Unclear the etiology.  Not coming from his vascular disease.  He asked about potentially checking this further with his continued visual symptoms and headaches, and I will defer to his primary care provider or his ophthalmologist at this point.

## 2019-07-27 NOTE — Assessment & Plan Note (Signed)
lipid control important in reducing the progression of atherosclerotic disease.  Previously on Repatha, but apparently not taking anymore

## 2019-07-27 NOTE — Assessment & Plan Note (Addendum)
Carotid duplex today reveals mild carotid artery disease on each side with minimal plaque on the right and some intimal thickening on the left with stenosis on the low end of the 1 to 39% range bilaterally.  With this minimal disease, this can be checked every 2 to 3 years with duplex.  No role for intervention at this point.  Consider baby aspirin daily.

## 2019-07-27 NOTE — Progress Notes (Signed)
MRN : [id number]  Francis Cross is a 64 y.o. (Jun 30, 1955) male who presents with chief complaint of  Chief Complaint  Patient presents with  . Follow-up    U/Sfollow up  .  History of Present Illness: Patient returns today in follow up of multiple vascular test performed in our office.  No new symptoms or changes since his last visit.  Continue to have some headaches and visual symptoms that are not necessarily improving. ABIs today were normal with triphasic waveforms and ABIs of 1.2 on each side.  Digit pressures and waveforms were normal.  Carotid duplex today reveals mild carotid artery disease on each side with minimal plaque on the right and some intimal thickening on the left with stenosis on the low end of the 1 to 39% range bilaterally.  Current Outpatient Medications  Medication Sig Dispense Refill  . ketoconazole (NIZORAL) 2 % shampoo APPLY TOPICALLY 2 (TWO) TIMES A WEEK. (Patient taking differently: Apply 1 application topically 2 (two) times a week. ) 120 mL 1  . ketorolac (ACULAR) 0.5 % ophthalmic solution Place 1 drop into both eyes 2 (two) times daily as needed. 5 mL 1  . losartan-hydrochlorothiazide (HYZAAR) 100-25 MG tablet Take 1 tablet by mouth daily. 90 tablet 1  . omeprazole (PRILOSEC) 20 MG capsule Take 1 capsule (20 mg total) by mouth daily. (Patient taking differently: Take 20 mg by mouth as needed (Heartburn). ) 20 capsule 0  . tamsulosin (FLOMAX) 0.4 MG CAPS capsule Take 0.4 mg by mouth as needed (Kidney stone).     Marland Kitchen acetaminophen (TYLENOL) 325 MG tablet Take 650 mg by mouth every 6 (six) hours as needed for mild pain or moderate pain.    . celecoxib (CELEBREX) 200 MG capsule Take 200 mg by mouth at bedtime.     . Dulaglutide (TRULICITY) 3 0000000 SOPN Inject 3 mg as directed once a week. 6 mL 1  . Evolocumab (REPATHA SURECLICK) [id number] MG/ML SOAJ Inject 140 mg into the skin every 14 (fourteen) days. (Patient not taking: Reported on 07/27/2019) 2 pen 12  . Insulin  Glargine (BASAGLAR KWIKPEN) 100 UNIT/ML INJECT 16 UNITS INTO THE SKIN AT BEDTIME. (Patient not taking: Reported on 07/27/2019) 15 pen 1  . Insulin Pen Needle (PEN NEEDLES) 32G X 4 MM MISC 1 application by Does not apply route daily. (Patient not taking: Reported on 07/27/2019) 100 each 3  . Prednisolon-Gatiflox-Bromfenac 1-0.5-0.075 % SOLN Place 1 drop into the right eye See admin instructions. 1st week 4 times a day 2nd week 3 times a day 3rd week 2 times a day 4th week 1 time a day until complete    . pregabalin (LYRICA) 50 MG capsule Take 1 capsule (50 mg total) by mouth 3 (three) times daily. (Patient not taking: Reported on 07/27/2019) 90 capsule 1   No current facility-administered medications for this visit.    Past Medical History:  Diagnosis Date  . Acid reflux   . Allergy   . Arthritis    hands  . Cancer ([place])    skin cancer- basal cell on nose  . Cataract   . Chronic kidney disease    Kidney stones  . Diabetes mellitus without complication ([place])   . Difficult intubation   . Erectile dysfunction   . Fatty liver   . Gout   . Hemochromatosis   . Hiatal hernia   . History of kidney stones   . Hyperlipidemia   . Hypertension   . Iron overload   .  Obesity   . Sciatica    bilateral, intermittent    Past Surgical History:  Procedure Laterality Date  . APPENDECTOMY    . BACK SURGERY  09/09/2017   lumbar discectomy   . CARDIAC CATHETERIZATION     [facility]  . CATARACT EXTRACTION W/PHACO Right 12/15/2018   Procedure: CATARACT EXTRACTION PHACO AND INTRAOCULAR LENS PLACEMENT ([facility]) RIGHT DIABETIC 00:35.7  16.3%  5.81;  Surgeon: Birder Robson, MD;  Location: [facility];  Service: Ophthalmology;  Laterality: Right;  diabetic - insulin  . CATARACT EXTRACTION W/PHACO Left 01/05/2019   Procedure: CATARACT EXTRACTION PHACO AND INTRAOCULAR LENS PLACEMENT ([place]) LEFT DIABETIC;  Surgeon: Birder Robson, MD;  Location: [facility];  Service: Ophthalmology;  Laterality:  Left;  0:31 16.4% 5.20  . CERVICAL FUSION    . COLONOSCOPY WITH PROPOFOL N/A 12/22/2018   Procedure: COLONOSCOPY WITH PROPOFOL;  Surgeon: Doran Stabler, MD;  Location: [facility];  Service: Gastroenterology;  Laterality: N/A;  . KIDNEY STONE SURGERY  03/07/2017   laser blast  . LUMBAR LAMINECTOMY/DECOMPRESSION MICRODISCECTOMY N/A 09/23/2017   Procedure: Lumbar Four-Five Redo Discectomy for epidural hematoma.;  Surgeon: Kristeen Miss, MD;  Location: [place] [facility];  Service: Neurosurgery;  Laterality: N/A;  . TONSILLECTOMY       Social History   Tobacco Use  . Smoking status: Former Research scientist (life sciences)  . Smokeless tobacco: Former Systems developer  . Tobacco comment: socially as teenager  Substance Use Topics  . Alcohol use: Not Currently  . Drug use: No     Family History  Problem Relation Age of Onset  . Lung cancer Father   . Melanoma Mother   . Hyperlipidemia Sister   . Hypertension Sister   . Colon cancer Neg Hx   . Stomach cancer Neg Hx   . Rectal cancer Neg Hx   . Esophageal cancer Neg Hx   . Liver cancer Neg Hx      Allergies  Allergen Reactions  . Keflex [Cephalexin] Rash  . Penicillins Itching, Rash and Other (See Comments)    Has patient had a PCN reaction causing immediate rash, facial/tongue/throat swelling, SOB or lightheadedness with hypotension: No Has patient had a PCN reaction causing severe rash involving mucus membranes or skin necrosis: No Has patient had a PCN reaction that required hospitalization: No Has patient had a PCN reaction occurring within the last 10 years: No If all of the above answers are "NO", then may proceed with Cephalosporin use.      REVIEW OF SYSTEMS (Negative unless checked)  Constitutional: [] Weight loss  [] Fever  [] Chills Cardiac: [] Chest pain   [] Chest pressure   [] Palpitations   [] Shortness of breath when laying flat   [] Shortness of breath at rest   [] Shortness of breath with exertion. Vascular:  [] Pain in legs with walking   [] Pain in legs  at rest   [] Pain in legs when laying flat   [] Claudication   [] Pain in feet when walking  [] Pain in feet at rest  [] Pain in feet when laying flat   [] History of DVT   [] Phlebitis   [] Swelling in legs   [] Varicose veins   [] Non-healing ulcers Pulmonary:   [] Uses home oxygen   [] Productive cough   [] Hemoptysis   [] Wheeze  [] COPD   [] Asthma Neurologic:  [] Dizziness  [] Blackouts   [] Seizures   [] History of stroke   [] History of TIA  [] Aphasia   [] Temporary blindness   [] Dysphagia   [] Weakness or numbness in arms   [] Weakness or numbness  in legs X positive for headaches and some visual symptoms Musculoskeletal:  [x] Arthritis   [] Joint swelling   [] Joint pain   [] Low back pain Hematologic:  [] Easy bruising  [] Easy bleeding   [] Hypercoagulable state   [] Anemic   Gastrointestinal:  [] Blood in stool   [] Vomiting blood  [] Gastroesophageal reflux/heartburn   [] Abdominal pain Genitourinary:  [] Chronic kidney disease   [] Difficult urination  [] Frequent urination  [] Burning with urination   [] Hematuria Skin:  [] Rashes   [] Ulcers   [] Wounds Psychological:  [] History of anxiety   []  History of major depression.  Physical Examination  BP (!) 133/97   Pulse 90   Ht 5\' 5"  (1.651 m)   Wt 206 lb (93.4 kg)   BMI 34.28 kg/m  Gen:  WD/WN, NAD Head: [place]/AT, No temporalis wasting. Ear/Nose/Throat: Hearing grossly intact, nares w/o erythema or drainage Eyes: Conjunctiva clear. Sclera non-icteric Neck: Supple.  Trachea midline Pulmonary:  Good air movement, no use of accessory muscles.  Cardiac: RRR, no JVD Vascular:  Vessel Right Left  Radial Palpable Palpable                          PT Palpable Palpable  DP Palpable Palpable   Musculoskeletal: M/S 5/5 throughout.  No deformity or atrophy.  No significant lower extremity edema. Neurologic: Sensation grossly intact in extremities.  Symmetrical.  Speech is fluent.  Psychiatric: Judgment intact, Mood & affect appropriate for pt's clinical  situation. Dermatologic: No rashes or ulcers noted.  No cellulitis or open wounds.       Labs Recent Results (from the past 2160 hour(s))  Lipid panel     Status: Abnormal   Collection Time: 06/14/19  8:27 AM  Result Value Ref Range   Cholesterol, Total 124 100 - 199 mg/dL   Triglycerides 166 (H) 0 - 149 mg/dL   HDL 48 >39 mg/dL   VLDL Cholesterol Cal 28 5 - 40 mg/dL   LDL Chol Calc (NIH) 48 0 - 99 mg/dL   Chol/HDL Ratio 2.6 0.0 - 5.0 ratio    Comment:                                   T. Chol/HDL Ratio                                             Men  Women                               1/2 Avg.Risk  3.4    3.3                                   Avg.Risk  5.0    4.4                                2X Avg.Risk  9.6    7.1                                3X Avg.Risk 23.4   11.0  VITAMIN D 25 Hydroxy (Vit-D Deficiency, Fractures)     Status: None   Collection Time: 06/14/19  8:27 AM  Result Value Ref Range   Vit D, 25-Hydroxy 36.8 30.0 - 100.0 ng/mL    Comment: Vitamin D deficiency has been defined by the [place] practice guideline as a level of serum 25-OH vitamin D less than 20 ng/mL (1,2). The Endocrine Society went on to further define vitamin D insufficiency as a level between 21 and 29 ng/mL (2). 1. IOM (Institute of Medicine). 2010. Dietary reference    intakes for calcium and D. [place]: The    [employer]. 2. Holick MF, Binkley [place], Bischoff-Ferrari HA, et al.    Evaluation, treatment, and prevention of vitamin D    deficiency: an Endocrine Society clinical practice    guideline. JCEM. 2011 Jul; 96(7):1911-30.   CBC     Status: None   Collection Time: 06/14/19  8:27 AM  Result Value Ref Range   WBC 7.6 3.4 - 10.8 x10E3/uL   RBC 4.78 4.14 - 5.80 x10E6/uL   Hemoglobin 14.9 13.0 - 17.7 g/dL   Hematocrit 43.3 37.5 - 51.0 %   MCV 91 79 - 97 fL   MCH 31.2 26.6 - 33.0 pg   MCHC 34.4 31.5 - 35.7 g/dL   RDW 12.6  11.6 - 15.4 %   Platelets 184 150 - 450 x10E3/uL  TSH     Status: None   Collection Time: 06/14/19  8:27 AM  Result Value Ref Range   TSH 2.170 0.450 - 4.500 uIU/mL  PSA     Status: None   Collection Time: 06/14/19  8:27 AM  Result Value Ref Range   Prostate Specific Ag, Serum 0.6 0.0 - 4.0 ng/mL    Comment: Roche ECLIA methodology. According to the American Urological Association, Serum PSA should decrease and remain at undetectable levels after radical prostatectomy. The AUA defines biochemical recurrence as an initial PSA value 0.2 ng/mL or greater followed by a subsequent confirmatory PSA value 0.2 ng/mL or greater. Values obtained with different assay methods or kits cannot be used interchangeably. Results cannot be interpreted as absolute evidence of the presence or absence of malignant disease.   Comprehensive metabolic panel     Status: Abnormal   Collection Time: 06/14/19  8:27 AM  Result Value Ref Range   Glucose 169 (H) 65 - 99 mg/dL   BUN 23 8 - 27 mg/dL   Creatinine, Ser 1.13 0.76 - 1.27 mg/dL   GFR calc non Af Amer 69 >59 mL/min/1.73   GFR calc Af Amer 80 >59 mL/min/1.73   BUN/Creatinine Ratio 20 10 - 24   Sodium 139 134 - 144 mmol/L   Potassium 3.8 3.5 - 5.2 mmol/L   Chloride 100 96 - 106 mmol/L   CO2 21 20 - 29 mmol/L   Calcium 9.4 8.6 - 10.2 mg/dL   Total Protein 6.4 6.0 - 8.5 g/dL   Albumin 4.5 3.8 - 4.8 g/dL   Globulin, Total 1.9 1.5 - 4.5 g/dL   Albumin/Globulin Ratio 2.4 (H) 1.2 - 2.2   Bilirubin Total 0.5 0.0 - 1.2 mg/dL   Alkaline Phosphatase 70 39 - 117 IU/L   AST 30 0 - 40 IU/L   ALT 43 0 - 44 IU/L  Hemoglobin A1c     Status: Abnormal   Collection Time: 06/14/19  8:27 AM  Result Value Ref Range   Hgb A1c MFr Bld 8.8 (H)  4.8 - 5.6 %    Comment:          Prediabetes: 5.7 - 6.4          Diabetes: >6.4          Glycemic control for adults with diabetes: <7.0    Est. average glucose Bld gHb Est-mCnc 206 mg/dL  Microalbumin / creatinine urine  ratio     Status: None   Collection Time: 06/14/19  9:13 AM  Result Value Ref Range   Creatinine, Urine 71.8 Not Estab. mg/dL   Microalbumin, Urine 3.5 Not Estab. ug/mL   Microalb/Creat Ratio 5 0 - 29 mg/g creat    Comment:                        Normal:                0 -  29                        Moderately increased: 30 - 300                        Severely increased:       >300     Radiology No results found.  Assessment/Plan  Essential hypertension blood pressure control important in reducing the progression of atherosclerotic disease. On appropriate oral medications.   Hypercholesteremia lipid control important in reducing the progression of atherosclerotic disease.  Previously on Repatha, but apparently not taking anymore   Pain in limb ABIs today were normal with triphasic waveforms and ABIs of 1.2 on each side.  Digit pressures and waveforms were normal.  No lower extremity arterial insufficiency is present.  Headache Unclear the etiology.  Not coming from his vascular disease.  He asked about potentially checking this further with his continued visual symptoms and headaches, and I will defer to his primary care provider or his ophthalmologist at this point.  Carotid stenosis Carotid duplex today reveals mild carotid artery disease on each side with minimal plaque on the right and some intimal thickening on the left with stenosis on the low end of the 1 to 39% range bilaterally.  With this minimal disease, this can be checked every 2 to 3 years with duplex.  No role for intervention at this point.  Consider baby aspirin daily.    Leotis Pain, MD  07/27/2019 4:12 PM    This note was created with Dragon medical transcription system.  Any errors from dictation are purely unintentional

## 2019-07-28 ENCOUNTER — Other Ambulatory Visit (INDEPENDENT_AMBULATORY_CARE_PROVIDER_SITE_OTHER): Payer: Self-pay | Admitting: Nurse Practitioner

## 2019-07-28 DIAGNOSIS — R519 Headache, unspecified: Secondary | ICD-10-CM

## 2019-07-28 DIAGNOSIS — G8929 Other chronic pain: Secondary | ICD-10-CM

## 2019-07-29 ENCOUNTER — Telehealth: Payer: Self-pay

## 2019-07-29 MED ORDER — PRALUENT 150 MG/ML ~~LOC~~ SOAJ: 150.0000 mg | SUBCUTANEOUS | 11 refills | Status: DC

## 2019-07-29 NOTE — Telephone Encounter (Signed)
Called and spoke w/pt regarding the approval of praluent instead of the repatha. copay card emailed and rx sent

## 2019-08-20 ENCOUNTER — Other Ambulatory Visit: Payer: Self-pay

## 2019-08-23 ENCOUNTER — Other Ambulatory Visit: Payer: Self-pay | Admitting: Cardiovascular Disease

## 2019-09-01 LAB — HM DIABETES EYE EXAM

## 2019-09-08 ENCOUNTER — Other Ambulatory Visit: Payer: Self-pay | Admitting: Family Medicine

## 2019-09-08 DIAGNOSIS — E1165 Type 2 diabetes mellitus with hyperglycemia: Secondary | ICD-10-CM

## 2019-09-16 ENCOUNTER — Ambulatory Visit: Payer: BC Managed Care – PPO | Admitting: Family Medicine

## 2019-09-16 ENCOUNTER — Encounter: Payer: Self-pay | Admitting: Family Medicine

## 2019-09-16 ENCOUNTER — Ambulatory Visit (INDEPENDENT_AMBULATORY_CARE_PROVIDER_SITE_OTHER): Payer: BC Managed Care – PPO

## 2019-09-16 ENCOUNTER — Other Ambulatory Visit: Payer: Self-pay

## 2019-09-16 VITALS — BP 137/90 | HR 87 | Temp 97.1°F | Ht 65.0 in | Wt 205.0 lb

## 2019-09-16 DIAGNOSIS — E1165 Type 2 diabetes mellitus with hyperglycemia: Secondary | ICD-10-CM | POA: Diagnosis not present

## 2019-09-16 DIAGNOSIS — Z7189 Other specified counseling: Secondary | ICD-10-CM

## 2019-09-16 DIAGNOSIS — Z87891 Personal history of nicotine dependence: Secondary | ICD-10-CM

## 2019-09-16 DIAGNOSIS — B3749 Other urogenital candidiasis: Secondary | ICD-10-CM

## 2019-09-16 DIAGNOSIS — E785 Hyperlipidemia, unspecified: Secondary | ICD-10-CM

## 2019-09-16 DIAGNOSIS — E1140 Type 2 diabetes mellitus with diabetic neuropathy, unspecified: Secondary | ICD-10-CM | POA: Diagnosis not present

## 2019-09-16 DIAGNOSIS — I10 Essential (primary) hypertension: Secondary | ICD-10-CM

## 2019-09-16 DIAGNOSIS — Z7185 Encounter for immunization safety counseling: Secondary | ICD-10-CM

## 2019-09-16 LAB — COMPREHENSIVE METABOLIC PANEL
ALT: 54 IU/L — ABNORMAL HIGH (ref 0–44)
AST: 38 IU/L (ref 0–40)
Albumin/Globulin Ratio: 2.3 — ABNORMAL HIGH (ref 1.2–2.2)
Albumin: 5 g/dL — ABNORMAL HIGH (ref 3.8–4.8)
Alkaline Phosphatase: 70 IU/L (ref 48–121)
BUN/Creatinine Ratio: 11 (ref 10–24)
BUN: 11 mg/dL (ref 8–27)
Bilirubin Total: 0.6 mg/dL (ref 0.0–1.2)
CO2: 24 mmol/L (ref 20–29)
Calcium: 9.9 mg/dL (ref 8.6–10.2)
Chloride: 98 mmol/L (ref 96–106)
Creatinine, Ser: 0.96 mg/dL (ref 0.76–1.27)
GFR calc Af Amer: 97 mL/min/{1.73_m2} (ref >59)
GFR calc non Af Amer: 84 mL/min/{1.73_m2} (ref >59)
Globulin, Total: 2.2 g/dL (ref 1.5–4.5)
Glucose: 183 mg/dL — ABNORMAL HIGH (ref 65–99)
Potassium: 3.9 mmol/L (ref 3.5–5.2)
Sodium: 139 mmol/L (ref 134–144)
Total Protein: 7.2 g/dL (ref 6.0–8.5)

## 2019-09-16 LAB — LIPID PANEL
Chol/HDL Ratio: 2.7 ratio (ref 0.0–5.0)
Cholesterol, Total: 142 mg/dL (ref 100–199)
HDL: 52 mg/dL (ref >39)
LDL Chol Calc (NIH): 66 mg/dL (ref 0–99)
Triglycerides: 135 mg/dL (ref 0–149)
VLDL Cholesterol Cal: 24 mg/dL (ref 5–40)

## 2019-09-16 LAB — POCT GLYCOSYLATED HEMOGLOBIN (HGB A1C): Hemoglobin A1C: 8.5 % — AB (ref 4.0–5.6)

## 2019-09-16 LAB — GLUCOSE, POCT (MANUAL RESULT ENTRY): POC Glucose: 185 mg/dl — AB (ref 70–99)

## 2019-09-16 MED ORDER — BASAGLAR KWIKPEN 100 UNIT/ML ~~LOC~~ SOPN: 18.0000 [IU] | PEN_INJECTOR | Freq: Every day | SUBCUTANEOUS | 1 refills | Status: DC

## 2019-09-16 MED ORDER — TRULICITY 3 MG/0.5ML ~~LOC~~ SOAJ: 3.0000 mg | SUBCUTANEOUS | 1 refills | Status: DC

## 2019-09-16 MED ORDER — CLOTRIMAZOLE 1 % EX CREA: 1.0000 "application " | TOPICAL_CREAM | Freq: Two times a day (BID) | CUTANEOUS | 1 refills | Status: AC

## 2019-09-16 MED ORDER — LOSARTAN POTASSIUM-HCTZ 100-25 MG PO TABS: 1.0000 | ORAL_TABLET | Freq: Every day | ORAL | 1 refills | Status: DC

## 2019-09-16 NOTE — Patient Instructions (Addendum)
  As we discussed I am not sure how antibody test would help at this time as I would still recommend covid vaccine even if exposed prior. Please let me know if you have questions about the vaccine, but I do strongly recommend that vaccine for you.   I will check cholesterol levels, but follow-up with the lipid clinic to decide on any medication changes.  No change in blood pressure medicine at this time.  If he noticed readings remain over 140 on the upper number or above 90 on the lower number at home, let me know as we may need to adjust medication.  Unfortunately diabetes is still uncontrolled.  Increase Basaglar to 18 units/day, send me a record of your fasting and postprandial blood sugars in the next 2 weeks. Follow-up office visit in 6 weeks for a fructosamine level where we can fine-tune your regimen further.  Thanks for coming in today.   Clotrimazole if needed for any signs of fungal infection in groin. If any swelling - be seen right away. Improved blood sugar may also help limit these flairs.   If you have lab work done today you will be contacted with your lab results within the next 2 weeks.  If you have not heard from [place] then please contact us. The fastest way to get your results is to register for My Chart.   IF you received an x-ray today, you will receive an invoice from [facility]. Please contact [facility] at [phone] with questions or concerns regarding your invoice.   IF you received labwork today, you will receive an invoice from [place]. Please contact [facility] at [phone] with questions or concerns regarding your invoice.   Our billing staff will not be able to assist you with questions regarding bills from these companies.  You will be contacted with the lab results as soon as they are available. The fastest way to get your results is to activate your My Chart account. Instructions are located on the last page of this paperwork. If you have  not heard from [place] regarding the results in 2 weeks, please contact this office.

## 2019-09-16 NOTE — Progress Notes (Signed)
Subjective:  Patient ID: Francis Cross, male    DOB: 08-09-1955  Age: 64 y.o. MRN: [id number]  CC:  Chief Complaint  Patient presents with  . Follow-up    on diabetes, hyperlipidemia, and tyroid. Pt reports no issues with his diabetes since last OV. pt checks his BS 1-2x daily. pt states his BS has been runing 160-180mg /dl in the morning fasting. pt reports no physical symptoms of hyperlipidemia or his thyroid condition.    HPI Francis Cross presents for   Diabetes: Complicated by hyperglycemia, diabetic neuropathy.  Previously on Basaglar 16 units daily daily, Trulicity 1.5 mg weekly at April visit.  A1c elevated at 8.8.  Trulicity recommended to increase to 3 mg. Using higher dose of trulicity at 3mg  per week.  Thought number were coming down so he decreased insulin to half 8 units for few weeks, numbers increased - now back on 16 units per day.   Microalbumin: Normal ratio 06/14/2019 Optho, foot exam, pneumovax: Up-to-date Fastings 160-180. No symptomatic lows.  2-hour postprandials: 200-250.  Has candy or sugar if needed.   Occasional fungal infection around penis -1-2 times per month. Usually when hot outside Uncircumcised.  Not current flair.  No difficulty with foreskin retraction/replacement.  Used antifungal cream in past - needs refill.   He is on Praluent for cholesterol, ARB with losartan. Due for lipid clinic follow up, but fasting today.  Lyrica 50 mg 3 times daily for peripheral neuropathy in past. Felt like it made him depressed so stopped taking  Few months and no difference in symptoms. Followed by new back specialist.  [facility]. [facility].   Lab Results  Component Value Date   HGBA1C 8.5 (A) 09/16/2019   HGBA1C 8.8 (H) 06/14/2019   HGBA1C 7.7 (H) 01/15/2019   Lab Results  Component Value Date   MICROALBUR 0.5 09/21/2015   LDLCALC 48 06/14/2019   CREATININE 1.13 06/14/2019   Hypertension: Losartan HCTZ 100/25 mg daily.  BP Readings from Last 3  Encounters:  09/16/19 137/90  07/27/19 (!) 133/97  06/23/19 131/82   Lab Results  Component Value Date   CREATININE 1.13 06/14/2019   covid vaccine discussed. He declines. Asked about antibody test - discussed limitations on interpretation and still would recommend vaccine given his medical history.  Exposure to Covid new years this year. No symptoms and negative testing.   FH of lung cancer, father. Smoker.  Smoked off and on in teenage years - few years only in [place] school. No recent use. Some second hand smoke exposure prior. Daughter requested updated xray. No new cough or shortness of breath.  CXR in 2015:FINDINGS:  Lungs are clear. Heart size is normal. No pneumothorax or pleural  effusion. The patient is status post cervical fusion.   IMPRESSION:  No acute disease.     History Patient Active Problem List   Diagnosis Date Noted  . Pain in limb 07/27/2019  . Headache 07/27/2019  . Carotid stenosis 07/27/2019  . Special screening for malignant neoplasms, colon   . Difficult intubation   . Prostatitis 06/03/2018  . Paresthesia 03/11/2018  . Cough 03/04/2018  . Basal cell carcinoma of neck 12/02/2017  . Basal cell carcinoma of temple region 12/02/2017  . Herniated nucleus pulposus, L4-5 left 09/23/2017  . Degeneration of lumbar intervertebral disc 06/26/2017  . Hx of carpal tunnel repair 12/22/2015  . Testosterone deficiency 05/23/2014  . Pain in the chest 10/01/2013  . GOUT, UNSPECIFIED 01/29/2010  . Obesity 12/06/2008  . IRON  OVERLOAD 08/11/2007  . HIATAL HERNIA WITH REFLUX 07/08/2006  . Hypercholesteremia 06/12/2006  . Essential hypertension 06/12/2006  . 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 Acid reflux   . Allergy   . Arthritis    hands  . Cancer ([place])    skin cancer- basal cell on nose  . Cataract   . Chronic kidney disease    Kidney stones  . Diabetes mellitus without complication ([place])   . Difficult  intubation   . Erectile dysfunction   . Fatty liver   . Gout   . Hemochromatosis   . Hiatal hernia   . History of kidney stones   . Hyperlipidemia   . Hypertension   . Iron overload   . Obesity   . Sciatica    bilateral, intermittent   Past Surgical History:  Procedure Laterality Date  . APPENDECTOMY    . BACK SURGERY  09/09/2017   lumbar discectomy   . CARDIAC CATHETERIZATION     [facility]  . CATARACT EXTRACTION W/PHACO Right 12/15/2018   Procedure: CATARACT EXTRACTION PHACO AND INTRAOCULAR LENS PLACEMENT (IOC) RIGHT DIABETIC 00:35.7  16.3%  5.81;  Surgeon: Birder Robson, MD;  Location: [place];  Service: Ophthalmology;  Laterality: Right;  diabetic - insulin  . CATARACT EXTRACTION W/PHACO Left 01/05/2019   Procedure: CATARACT EXTRACTION PHACO AND INTRAOCULAR LENS PLACEMENT ([place]) LEFT DIABETIC;  Surgeon: Birder Robson, MD;  Location: [facility];  Service: Ophthalmology;  Laterality: Left;  0:31 16.4% 5.20  . CERVICAL FUSION    . COLONOSCOPY WITH PROPOFOL N/A 12/22/2018   Procedure: COLONOSCOPY WITH PROPOFOL;  Surgeon: Doran Stabler, MD;  Location: [facility];  Service: Gastroenterology;  Laterality: N/A;  . KIDNEY STONE SURGERY  03/07/2017   laser blast  . LUMBAR LAMINECTOMY/DECOMPRESSION MICRODISCECTOMY N/A 09/23/2017   Procedure: Lumbar Four-Five Redo Discectomy for epidural hematoma.;  Surgeon: Kristeen Miss, MD;  Location: [facility];  Service: Neurosurgery;  Laterality: N/A;  . TONSILLECTOMY     Allergies  Allergen Reactions  . Keflex [Cephalexin] Rash  . Penicillins Itching, Rash and Other (See Comments)    Has patient had a PCN reaction causing immediate rash, facial/tongue/throat swelling, SOB or lightheadedness with hypotension: No Has patient had a PCN reaction causing severe rash involving mucus membranes or skin necrosis: No Has patient had a PCN reaction that required hospitalization: No Has patient had a PCN reaction occurring within the  last 10 years: No If all of the above answers are "NO", then may proceed with Cephalosporin use.    Prior to Admission medications   Medication Sig Start Date End Date Taking? Authorizing Provider  acetaminophen (TYLENOL) 325 MG tablet Take 650 mg by mouth every 6 (six) hours as needed for mild pain or moderate pain.   Yes [provider]  Alirocumab (PRALUENT) 150 MG/ML SOAJ Inject 150 mg into the skin every 14 (fourteen) days. 07/29/19  Yes Wellington Hampshire, MD  BD PEN NEEDLE NANO 2ND GEN 32G X 4 MM MISC FOR USE WITH BASAGLAR PEN 09/08/19  Yes [name], MD  Dulaglutide (TRULICITY) 3 TD/4.2AJ SOPN Inject 3 mg as directed once a week. 06/24/19  Yes [name], MD  Insulin Glargine [facility]) 100 UNIT/ML INJECT 16 UNITS INTO THE SKIN AT BEDTIME. 05/15/19  Yes [name], MD  ketoconazole (NIZORAL) 2 % shampoo APPLY TOPICALLY 2 (TWO) TIMES A WEEK. Patient taking differently: Apply 1 application topically 2 (two)  times a week.  04/11/16  Yes Shawnee Knapp, MD  ketorolac (ACULAR) 0.5 % ophthalmic solution Place 1 drop into both eyes 2 (two) times daily as needed. 04/11/16  Yes Shawnee Knapp, MD  losartan-hydrochlorothiazide (HYZAAR) 100-25 MG tablet Take 1 tablet by mouth daily. 05/04/19  Yes Wellington Hampshire, MD  omeprazole (PRILOSEC) 20 MG capsule Take 1 capsule (20 mg total) by mouth daily. Patient taking differently: Take 20 mg by mouth as needed (Heartburn).  04/24/16  Yes Danis, Kirke Corin, MD  tamsulosin (FLOMAX) 0.4 MG CAPS capsule Take 0.4 mg by mouth as needed (Kidney stone).    Yes [provider]  celecoxib (CELEBREX) 200 MG capsule Take 200 mg by mouth at bedtime.  Patient not taking: Reported on 09/16/2019 11/18/18   [provider]  pregabalin (LYRICA) 50 MG capsule Take 1 capsule (50 mg total) by mouth 3 (three) times daily. Patient not taking: Reported on 07/27/2019 06/07/19   [name], MD   Social History   Socioeconomic History    . Marital status: Married    Spouse name: Not on file  . Number of children: 3  . Years of education: Not on file  . Highest education level: Not on file  Occupational History  . Occupation: retired  Tobacco Use  . Smoking status: Former Research scientist (life sciences)  . Smokeless tobacco: Former Systems developer  . Tobacco comment: socially as teenager  Vaping Use  . Vaping Use: Never used  Substance and Sexual Activity  . Alcohol use: Not Currently  . Drug use: No  . Sexual activity: Not on file  Other Topics Concern  . Not on file  Social History Narrative  . Not on file   Social Determinants of Health   Financial Resource Strain:   . Difficulty of Paying Living Expenses:   Food Insecurity:   . Worried About Charity fundraiser in the Last Year:   . Arboriculturist in the Last Year:   Transportation Needs:   . Film/video editor (Medical):   Marland Kitchen Lack of Transportation (Non-Medical):   Physical Activity:   . Days of Exercise per Week:   . Minutes of Exercise per Session:   Stress:   . Feeling of Stress :   Social Connections:   . Frequency of Communication with Friends and Family:   . Frequency of Social Gatherings with Friends and Family:   . Attends Religious Services:   . Active Member of Clubs or Organizations:   . Attends Archivist Meetings:   Marland Kitchen Marital Status:   Intimate Partner Violence:   . Fear of Current or Ex-Partner:   . Emotionally Abused:   Marland Kitchen Physically Abused:   . Sexually Abused:     Review of Systems  Constitutional: Negative for fatigue and unexpected weight change.  Eyes: Negative for visual disturbance.  Respiratory: Negative for cough, chest tightness and shortness of breath.   Cardiovascular: Negative for chest pain, palpitations and leg swelling.  Gastrointestinal: Negative for abdominal pain and blood in stool.  Musculoskeletal: Positive for back pain (chronic as above ).  Neurological: Negative for dizziness, light-headedness and headaches.      Objective:   Vitals:   09/16/19 0956  BP: 137/90  Pulse: 87  Temp: (!) 97.1 F (36.2 C)  TempSrc: Temporal  SpO2: 97%  Weight: 205 lb (93 kg)  Height: 5\' 5"  (1.651 m)     Physical Exam Vitals reviewed.  Constitutional:  General: He is not in acute distress.    Appearance: He is well-developed. He is obese.  HENT:     Head: Normocephalic and atraumatic.  Eyes:     Pupils: Pupils are equal, round, and reactive to light.  Neck:     Vascular: No carotid bruit or JVD.  Cardiovascular:     Rate and Rhythm: Normal rate and regular rhythm.     Heart sounds: Normal heart sounds. No murmur heard.   Pulmonary:     Effort: Pulmonary effort is normal. No respiratory distress.     Breath sounds: Normal breath sounds. No wheezing, rhonchi or rales.  Skin:    General: Skin is warm and dry.  Neurological:     Mental Status: He is alert and oriented to person, place, and time.     DG Chest 2 View  Result Date: 09/16/2019 CLINICAL DATA:  Personal history of tobacco use EXAM: CHEST - 2 VIEW COMPARISON:  09/20/2013 FINDINGS: Normal heart size and mediastinal contours. No acute infiltrate or edema. No effusion or pneumothorax. ACDF hardware. No acute osseous findings. IMPRESSION: Negative chest. Electronically Signed   By: Monte Fantasia M.D.   On: 09/16/2019 11:24   Results for orders placed or performed in visit on 09/16/19  POCT glycosylated hemoglobin (Hb A1C)  Result Value Ref Range   Hemoglobin A1C 8.5 (A) 4.0 - 5.6 %   HbA1c POC (<> result, manual entry)     HbA1c, POC (prediabetic range)     HbA1c, POC (controlled diabetic range)    POCT glucose (manual entry)  Result Value Ref Range   POC Glucose 185 (A) 70 - 99 mg/dl      Assessment & Plan:  Francis Cross is a 64 y.o. male . Type 2 diabetes mellitus with hyperglycemia, without long-term current use of insulin (HCC) - Plan: Dulaglutide (TRULICITY) 3 JM/4.2AS SOPN, POCT glycosylated hemoglobin (Hb A1C), POCT  glucose (manual entry), Insulin Glargine (BASAGLAR KWIKPEN) 100 UNIT/ML Type 2 diabetes mellitus with diabetic neuropathy, without long-term current use of insulin (HCC) - Plan: Dulaglutide (TRULICITY) 3 TM/1.9QQ SOPN, POCT glycosylated hemoglobin (Hb A1C)  -Still uncontrolled, unfortunately decreased his dose of Basaglar after last visit, will require higher dosing at this point.  Increase by 2 units, continue same dose of Trulicity for now, recheck 6 weeks with fructosamine.  Did recommend sending me his fasting and postprandials over the next few weeks to decide on possible changes sooner  Hyperlipidemia, unspecified hyperlipidemia type - Plan: Lipid panel, Comprehensive metabolic panel  - check labs, follow up with lipid specialist.   Essential hypertension - Plan: losartan-hydrochlorothiazide (HYZAAR) 100-25 MG tablet  -   Stable, tolerating current regimen. Medications refilled. Labs pending as above.   Personal history of tobacco use - Plan: DG Chest 2 View  - xray reassuring.   Vaccine counseling  - recommended covid vaccine. Declined at present. Antibody test and limitations discussed - decided against testing at this time.   Candida infection of genital region - Plan: clotrimazole (LOTRIMIN) 1 % cream  - intermittent. Discussed glycemic control, phimosis precautions. Lotrimin rx if needed.   Meds ordered this encounter  Medications  . Dulaglutide (TRULICITY) 3 IW/9.7LG SOPN    Sig: Inject 0.5 mLs (3 mg total) as directed once a week.    Dispense:  6 mL    Refill:  1  . losartan-hydrochlorothiazide (HYZAAR) 100-25 MG tablet    Sig: Take 1 tablet by mouth daily.    Dispense:  90 tablet  Refill:  1  . Insulin Glargine (BASAGLAR KWIKPEN) 100 UNIT/ML    Sig: Inject 0.18 mLs (18 Units total) into the skin daily.    Dispense:  15 pen    Refill:  1    DX Code Needed  .  . clotrimazole (LOTRIMIN) 1 % cream    Sig: Apply 1 application topically 2 (two) times daily. As needed to   affected area.    Dispense:  30 g    Refill:  1   Patient Instructions    As we discussed I am not sure how antibody test would help at this time as I would still recommend covid vaccine even if exposed prior. Please let me know if you have questions about the vaccine, but I do strongly recommend that vaccine for you.   I will check cholesterol levels, but follow-up with the lipid clinic to decide on any medication changes.  No change in blood pressure medicine at this time.  If he noticed readings remain over 140 on the upper number or above 90 on the lower number at home, let me know as we may need to adjust medication.  Unfortunately diabetes is still uncontrolled.  Increase Basaglar to 18 units/day, send me a record of your fasting and postprandial blood sugars in the next 2 weeks. Follow-up office visit in 6 weeks for a fructosamine level where we can fine-tune your regimen further.  Thanks for coming in today.   Clotrimazole if needed for any signs of fungal infection in groin. If any swelling - be seen right away. Improved blood sugar may also help limit these flairs.   If you have lab work done today you will be contacted with your lab results within the next 2 weeks.  If you have not heard from [place] then please contact us. The fastest way to get your results is to register for My Chart.   IF you received an x-ray today, you will receive an invoice from [facility]. Please contact [facility] at [phone] with questions or concerns regarding your invoice.   IF you received labwork today, you will receive an invoice from [place]. Please contact [facility] at [phone] with questions or concerns regarding your invoice.   Our billing staff will not be able to assist you with questions regarding bills from these companies.  You will be contacted with the lab results as soon as they are available. The fastest way to get your results is to activate your My Chart  account. Instructions are located on the last page of this paperwork. If you have not heard from [place] regarding the results in 2 weeks, please contact this office.         Signed, [name], MD [facility]

## 2019-09-21 NOTE — Progress Notes (Signed)
[facility]    Provider:  Dr Jaynee Eagles Requesting Provider: Eulogio Ditch, NP Primary Care Provider:  [name], MD  CC:  "funny feeling in the head"  HPI:  Francis Cross is a 64 y.o. male here as requested by [name], MD for headache.  Patient has a past medical history of diabetes with diabetic neuropathy, hyperlipidemia, sciatica, obesity, hypertension, history of kidney stones, gout, fatty liver, erectile dysfunction, chronic kidney disease, cataract, arthritis, history of smoking, he was referred by a nurse practitioner in vascular and vein for headaches I do not see where he recently spoke to his primary care about this at last appointment.  He presented to vascular and vein for multiple issues however he did report vision disturbances or TIA-like symptoms and he had an evaluation including carotid Dopplers.  He also reported headache at one point, no significant documentation that I found on his headache history.  Several months ago he started feeling "funny in the head" would last several hours, dry mouth in the morning, no headache, he has had eye problems, has floaters and cataract surgery end of last year and after that he couldn't see up close without bifocals after eye surgery. He has headaches in the front of the head and wakes up with headaches, once a week-2x a week, he denies napping, maybe foggy headed, lasts for "a while", several hours, gets better during the day, 1-2x a night he wakes to urinate. It has gotten better. Nothing makes it worse or better. His eyes are better now and has floaters due to problems in the globe per ophthalmology. He also has retinal thinning and problems there and the flashes and floaters are due to that and all those issues are attributed to problems in the eyes. No fluid in the ears, some hearing loss but nothing acute, no falls, he reports some memory issues but possibly not moreso than for age. No other focal neurologic  deficits, associated symptoms, inciting events or modifiable factors.  Reviewed notes, labs and imaging from outside physicians, which showed: see above  CMP with elevated glucose, BUN 11 and creatinine 0.96, ldl 66, hgba1c 8.8, TSH    Review of Systems: Patient complains of symptoms per HPI as well as the following symptoms:visionchanges, headache . Pertinent negatives and positives per HPI. All others negative.   Social History   Socioeconomic History  . Marital status: Married    Spouse name: Not on file  . Number of children: 3  . Years of education: Not on file  . Highest education level: Not on file  Occupational History  . Occupation: retired  Tobacco Use  . Smoking status: Former Research scientist (life sciences)  . Smokeless tobacco: Former Systems developer  . Tobacco comment: socially as teenager  Vaping Use  . Vaping Use: Never used  Substance and Sexual Activity  . Alcohol use: Not Currently  . Drug use: No  . Sexual activity: Not on file  Other Topics Concern  . Not on file  Social History Narrative  . Not on file   Social Determinants of Health   Financial Resource Strain:   . Difficulty of Paying Living Expenses:   Food Insecurity:   . Worried About Charity fundraiser in the Last Year:   . Arboriculturist in the Last Year:   Transportation Needs:   . Film/video editor (Medical):   Marland Kitchen Lack of Transportation (Non-Medical):   Physical Activity:   . Days of Exercise per Week:   .  Minutes of Exercise per Session:   Stress:   . Feeling of Stress :   Social Connections:   . Frequency of Communication with Friends and Family:   . Frequency of Social Gatherings with Friends and Family:   . Attends Religious Services:   . Active Member of Clubs or Organizations:   . Attends Archivist Meetings:   Marland Kitchen Marital Status:   Intimate Partner Violence:   . Fear of Current or Ex-Partner:   . Emotionally Abused:   Marland Kitchen Physically Abused:   . Sexually Abused:     Family History   Problem Relation Age of Onset  . Lung cancer Father   . Melanoma Mother   . Hyperlipidemia Sister   . Hypertension Sister   . Colon cancer Neg Hx   . Stomach cancer Neg Hx   . Rectal cancer Neg Hx   . Esophageal cancer Neg Hx   . Liver cancer Neg Hx     Past Medical History:  Diagnosis Date  . Acid reflux   . Allergy   . Arthritis    hands  . Cancer ([place])    skin cancer- basal cell on nose  . Cataract   . Chronic kidney disease    Kidney stones  . Diabetes mellitus without complication ([place])   . Difficult intubation   . Erectile dysfunction   . Fatty liver   . Gout   . Hemochromatosis   . Hiatal hernia   . History of kidney stones   . Hyperlipidemia   . Hypertension   . Iron overload   . Obesity   . Sciatica    bilateral, intermittent    Patient Active Problem List   Diagnosis Date Noted  . Pain in limb 07/27/2019  . Headache 07/27/2019  . Carotid stenosis 07/27/2019  . Special screening for malignant neoplasms, colon   . Difficult intubation   . Prostatitis 06/03/2018  . Paresthesia 03/11/2018  . Cough 03/04/2018  . Basal cell carcinoma of neck 12/02/2017  . Basal cell carcinoma of temple region 12/02/2017  . Herniated nucleus pulposus, L4-5 left 09/23/2017  . Degeneration of lumbar intervertebral disc 06/26/2017  . Hx of carpal tunnel repair 12/22/2015  . Testosterone deficiency 05/23/2014  . Pain in the chest 10/01/2013  . GOUT, UNSPECIFIED 01/29/2010  . Obesity 12/06/2008  . IRON OVERLOAD 08/11/2007  . HIATAL HERNIA WITH REFLUX 07/08/2006  . Hypercholesteremia 06/12/2006  . Essential hypertension 06/12/2006  . ERECTILE DYSFUNCTION, 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    Past Surgical History:  Procedure Laterality Date  . APPENDECTOMY    . BACK SURGERY  09/09/2017   lumbar discectomy   . CARDIAC CATHETERIZATION     [facility]  . CATARACT EXTRACTION W/PHACO Right 12/15/2018   Procedure: CATARACT EXTRACTION PHACO AND INTRAOCULAR LENS PLACEMENT ([facility]) RIGHT DIABETIC  00:35.7  16.3%  5.81;  Surgeon: Birder Robson, MD;  Location: [facility];  Service: Ophthalmology;  Laterality: Right;  diabetic - insulin  . CATARACT EXTRACTION W/PHACO Left 01/05/2019   Procedure: CATARACT EXTRACTION PHACO AND INTRAOCULAR LENS PLACEMENT ([place]) LEFT DIABETIC;  Surgeon: Birder Robson, MD;  Location: [facility];  Service: Ophthalmology;  Laterality: Left;  0:31 16.4% 5.20  . CERVICAL FUSION    . COLONOSCOPY WITH PROPOFOL N/A 12/22/2018   Procedure: COLONOSCOPY WITH PROPOFOL;  Surgeon: Doran Stabler, MD;  Location: [facility];  Service: Gastroenterology;  Laterality: N/A;  . KIDNEY STONE SURGERY  03/07/2017   laser blast  . LUMBAR LAMINECTOMY/DECOMPRESSION MICRODISCECTOMY  N/A 09/23/2017   Procedure: Lumbar Four-Five Redo Discectomy for epidural hematoma.;  Surgeon: Kristeen Miss, MD;  Location: [facility];  Service: Neurosurgery;  Laterality: N/A;  . TONSILLECTOMY      Current Outpatient Medications  Medication Sig Dispense Refill  . acetaminophen (TYLENOL) 325 MG tablet Take 650 mg by mouth every 6 (six) hours as needed for mild pain or moderate pain.    . Alirocumab (PRALUENT) 150 MG/ML SOAJ Inject 150 mg into the skin every 14 (fourteen) days. 2 pen 11  . BD PEN NEEDLE NANO 2ND GEN 32G X 4 MM MISC FOR USE WITH BASAGLAR PEN 100 each 3  . clotrimazole (LOTRIMIN) 1 % cream Apply 1 application topically 2 (two) times daily. As needed to  affected area. 30 g 1  . Dulaglutide (TRULICITY) 3 ZO/1.0RU SOPN Inject 0.5 mLs (3 mg total) as directed once a week. 6 mL 1  . Insulin Glargine (BASAGLAR KWIKPEN) 100 UNIT/ML Inject 0.18 mLs (18 Units total) into the skin daily. 15 pen 1  . ketoconazole (NIZORAL) 2 % shampoo APPLY TOPICALLY 2 (TWO) TIMES A WEEK. (Patient taking differently: Apply 1 application topically 2 (two) times a week. ) 120 mL 1  . ketorolac (ACULAR) 0.5 % ophthalmic solution Place 1 drop into both eyes 2 (two) times daily as needed. 5 mL 1  .  losartan-hydrochlorothiazide (HYZAAR) 100-25 MG tablet Take 1 tablet by mouth daily. 90 tablet 1  . omeprazole (PRILOSEC) 20 MG capsule Take 1 capsule (20 mg total) by mouth daily. (Patient taking differently: Take 20 mg by mouth as needed (Heartburn). ) 20 capsule 0  . tamsulosin (FLOMAX) 0.4 MG CAPS capsule Take 0.4 mg by mouth as needed (Kidney stone).      No current facility-administered medications for this visit.    Allergies as of 09/22/2019 - Review Complete 09/22/2019  Allergen Reaction Noted  . Keflex [cephalexin] Rash   . Penicillins Itching, Rash, and Other (See Comments)     Vitals: BP (!) 153/89   Pulse 65   Ht 5\' 5"  (1.651 m)   Wt 207 lb (93.9 kg)   BMI 34.45 kg/m  Last Weight:  Wt Readings from Last 1 Encounters:  09/22/19 207 lb (93.9 kg)   Last Height:   Ht Readings from Last 1 Encounters:  09/22/19 5\' 5"  (1.651 m)     Physical exam: Exam: Gen: NAD, conversant, well nourised, obese, well groomed                     CV: RRR, no MRG. No Carotid Bruits. No peripheral edema, warm, nontender Eyes: Conjunctivae clear without exudates or hemorrhage  Neuro: Detailed Neurologic Exam  Speech:    Speech is normal; fluent and spontaneous with normal comprehension.  Cognition:    The patient is oriented to person, place, and time;     recent and remote memory intact;     language fluent;     normal attention, concentration,     fund of knowledge Cranial Nerves:    The pupils are equal, round, and reactive to light. The fundi are normal and spontaneous venous pulsations are present. Visual fields are full to finger confrontation. Extraocular movements are intact. Trigeminal sensation is intact and the muscles of mastication are normal. The face is symmetric. The palate elevates in the midline. Hearing intact. Voice is normal. Shoulder shrug is normal. The tongue has normal motion without fasciculations.   Coordination:    Normal finger to nose and  heel to shin.  Normal rapid alternating movements.   Gait:    Heel-toe and tandem gait are normal.   Motor Observation:    No asymmetry, no atrophy, and no involuntary movements noted. Tone:    Normal muscle tone.    Posture:    Posture is normal. normal erect    Strength:    Strength is V/V in the upper and lower limbs.      Sensation: intact to LT     Reflex Exam:  DTR's:    Deep tendon reflexes in the upper and lower extremities are normal bilaterally.   Toes:    The toes are downgoing bilaterally.   Clonus:    Clonus is absent.    Assessment/Plan: 64 y.o. male here as requested by [name], MD for headache.  Patient has a past medical history of diabetes with diabetic neuropathy, hyperlipidemia, sciatica, obesity, hypertension, history of kidney stones, gout, fatty liver, erectile dysfunction, chronic kidney disease, cataract, arthritis, history of smoking, his hemoglobin A1c recently had worsened at 8.8 and we discussed close management of vascular risk factors and risks of stroke.  I recommend sleep evaluation: Patient seemed to be indecisive about getting a sleep evaluation, he does not think he snores but I asked him to discuss that with his wife, but I am concerned about sleep apnea considering his symptoms happened in the morning, when he wakes up he feels foggy, he has dry mouth in the mornings, he can have headaches upon waking in the morning, he is obese, he has a very large neck, he reports having more memory issues although he denies taking naps during the day falling asleep during day.  Still I think it would be prudent if he had a sleep study to rule that out.  We will also order an MRI of the brain as patient describes as funny feeling of confusion, with headaches, vision changes (they appear to be mostly due to issues within the eye however given symptoms we can order with and without contrast to ensure there is no other lesions or optic nerve involvement), evaluate for  stroke or other.   Orders Placed This Encounter  Procedures  . MR BRAIN W WO CONTRAST  . Ambulatory referral to Sleep Studies    Cc: [name], MD,   Eulogio Ditch, NP   Sarina Ill, MD  [facility] [address] [place], [place] [address]  Phone [phone] Fax [phone]

## 2019-09-22 ENCOUNTER — Ambulatory Visit: Payer: BC Managed Care – PPO | Admitting: Neurology

## 2019-09-22 ENCOUNTER — Encounter: Payer: Self-pay | Admitting: Neurology

## 2019-09-22 VITALS — BP 153/89 | HR 65 | Ht 65.0 in | Wt 207.0 lb

## 2019-09-22 DIAGNOSIS — R410 Disorientation, unspecified: Secondary | ICD-10-CM

## 2019-09-22 DIAGNOSIS — H539 Unspecified visual disturbance: Secondary | ICD-10-CM | POA: Diagnosis not present

## 2019-09-22 DIAGNOSIS — R51 Headache with orthostatic component, not elsewhere classified: Secondary | ICD-10-CM

## 2019-09-22 DIAGNOSIS — R519 Headache, unspecified: Secondary | ICD-10-CM | POA: Diagnosis not present

## 2019-09-22 NOTE — Patient Instructions (Signed)
MRI of the brain Sleep evaluation   Sleep Apnea Sleep apnea is a condition in which breathing pauses or becomes shallow during sleep. Episodes of sleep apnea usually last 10 seconds or longer, and they may occur as many as 20 times an hour. Sleep apnea disrupts your sleep and keeps your body from getting the rest that it needs. This condition can increase your risk of certain health problems, including:  Heart attack.  Stroke.  Obesity.  Diabetes.  Heart failure.  Irregular heartbeat. What are the causes? There are three kinds of sleep apnea:  Obstructive sleep apnea. This kind is caused by a blocked or collapsed airway.  Central sleep apnea. This kind happens when the part of the brain that controls breathing does not send the correct signals to the muscles that control breathing.  Mixed sleep apnea. This is a combination of obstructive and central sleep apnea. The most common cause of this condition is a collapsed or blocked airway. An airway can collapse or become blocked if:  Your throat muscles are abnormally relaxed.  Your tongue and tonsils are larger than normal.  You are overweight.  Your airway is smaller than normal. What increases the risk? You are more likely to develop this condition if you:  Are overweight.  Smoke.  Have a smaller than normal airway.  Are elderly.  Are male.  Drink alcohol.  Take sedatives or tranquilizers.  Have a family history of sleep apnea. What are the signs or symptoms? Symptoms of this condition include:  Trouble staying asleep.  Daytime sleepiness and tiredness.  Irritability.  Loud snoring.  Morning headaches.  Trouble concentrating.  Forgetfulness.  Decreased interest in sex.  Unexplained sleepiness.  Mood swings.  Personality changes.  Feelings of depression.  Waking up often during the night to urinate.  Dry mouth.  Sore throat. How is this diagnosed? This condition may be diagnosed  with:  A medical history.  A physical exam.  A series of tests that are done while you are sleeping (sleep study). These tests are usually done in a sleep lab, but they may also be done at home. How is this treated? Treatment for this condition aims to restore normal breathing and to ease symptoms during sleep. It may involve managing health issues that can affect breathing, such as high blood pressure or obesity. Treatment may include:  Sleeping on your side.  Using a decongestant if you have nasal congestion.  Avoiding the use of depressants, including alcohol, sedatives, and narcotics.  Losing weight if you are overweight.  Making changes to your diet.  Quitting smoking.  Using a device to open your airway while you sleep, such as: ? An oral appliance. This is a custom-made mouthpiece that shifts your lower jaw forward. ? A continuous positive airway pressure (CPAP) device. This device blows air through a mask when you breathe out (exhale). ? A nasal expiratory positive airway pressure (EPAP) device. This device has valves that you put into each nostril. ? A bi-level positive airway pressure (BPAP) device. This device blows air through a mask when you breathe in (inhale) and breathe out (exhale).  Having surgery if other treatments do not work. During surgery, excess tissue is removed to create a wider airway. It is important to get treatment for sleep apnea. Without treatment, this condition can lead to:  High blood pressure.  Coronary artery disease.  In men, an inability to achieve or maintain an erection (impotence).  Reduced thinking abilities. Follow these instructions  at home: Lifestyle  Make any lifestyle changes that your health care provider recommends.  Eat a healthy, well-balanced diet.  Take steps to lose weight if you are overweight.  Avoid using depressants, including alcohol, sedatives, and narcotics.  Do not use any products that contain nicotine  or tobacco, such as cigarettes, e-cigarettes, and chewing tobacco. If you need help quitting, ask your health care provider. General instructions  Take over-the-counter and prescription medicines only as told by your health care provider.  If you were given a device to open your airway while you sleep, use it only as told by your health care provider.  If you are having surgery, make sure to tell your health care provider you have sleep apnea. You may need to bring your device with you.  Keep all follow-up visits as told by your health care provider. This is important. Contact a health care provider if:  The device that you received to open your airway during sleep is uncomfortable or does not seem to be working.  Your symptoms do not improve.  Your symptoms get worse. Get help right away if:  You develop: ? Chest pain. ? Shortness of breath. ? Discomfort in your back, arms, or stomach.  You have: ? Trouble speaking. ? Weakness on one side of your body. ? Drooping in your face. These symptoms may represent a serious problem that is an emergency. Do not wait to see if the symptoms will go away. Get medical help right away. Call your local emergency services (911 in the [place] Do not drive yourself to the hospital. Summary  Sleep apnea is a condition in which breathing pauses or becomes shallow during sleep.  The most common cause is a collapsed or blocked airway.  The goal of treatment is to restore normal breathing and to ease symptoms during sleep. This information is not intended to replace advice given to you by your health care provider. Make sure you discuss any questions you have with your health care provider. Document Revised: 08/05/2018 Document Reviewed: 10/14/2017 Elsevier Patient Education  [place].

## 2019-09-27 ENCOUNTER — Telehealth: Payer: Self-pay | Admitting: Neurology

## 2019-09-27 NOTE — Telephone Encounter (Signed)
patient has [facility] order faxed to [facility] they will reach out to the patient to schedule.

## 2019-10-08 ENCOUNTER — Other Ambulatory Visit: Payer: Self-pay | Admitting: *Deleted

## 2019-10-08 DIAGNOSIS — R0989 Other specified symptoms and signs involving the circulatory and respiratory systems: Secondary | ICD-10-CM

## 2019-10-11 ENCOUNTER — Other Ambulatory Visit: Payer: Self-pay | Admitting: Neurological Surgery

## 2019-10-13 ENCOUNTER — Encounter: Payer: Self-pay | Admitting: Family Medicine

## 2019-10-13 ENCOUNTER — Other Ambulatory Visit: Payer: Self-pay

## 2019-10-13 ENCOUNTER — Ambulatory Visit: Payer: BC Managed Care – PPO | Admitting: Family Medicine

## 2019-10-13 VITALS — BP 127/84 | HR 75 | Temp 98.1°F | Ht 65.0 in | Wt 205.0 lb

## 2019-10-13 DIAGNOSIS — E1140 Type 2 diabetes mellitus with diabetic neuropathy, unspecified: Secondary | ICD-10-CM

## 2019-10-13 DIAGNOSIS — E1165 Type 2 diabetes mellitus with hyperglycemia: Secondary | ICD-10-CM

## 2019-10-13 NOTE — Patient Instructions (Addendum)
I do still recommend Covid vaccine. Please let me know if there are any specific questions or concerns I can address.   Will try to improve diabetes control, especially with upcoming surgery. Continue trulicity same dose. Increase basaglar to 20 units per day for next 3 days, then if persistent readings in the 200's, increase to 22 units. Send me you readings over the next 2 weeks. (once per day at minimum, and vary between fasting and 2 hour after meals). Do not skip meals, and watch for low blood sugars as we tighten the control. See precautions below.     Hypoglycemia Hypoglycemia is when the sugar (glucose) level in your blood is too low. Signs of low blood sugar may include:  Feeling: ? Hungry. ? Worried or nervous (anxious). ? Sweaty and clammy. ? Confused. ? Dizzy. ? Sleepy. ? Sick to your stomach (nauseous).  Having: ? A fast heartbeat. ? A headache. ? A change in your vision. ? Tingling or no feeling (numbness) around your mouth, lips, or tongue. ? Jerky movements that you cannot control (seizure).  Having trouble with: ? Moving (coordination). ? Sleeping. ? Passing out (fainting). ? Getting upset easily (irritability). Low blood sugar can happen to people who have diabetes and people who do not have diabetes. Low blood sugar can happen quickly, and it can be an emergency. Treating low blood sugar Low blood sugar is often treated by eating or drinking something sugary right away, such as:  Fruit juice, 4-6 oz (120-150 mL).  Regular soda (not diet soda), 4-6 oz (120-150 mL).  Low-fat milk, 4 oz (120 mL).  Several pieces of hard candy.  Sugar or honey, 1 Tbsp (15 mL). Treating low blood sugar if you have diabetes If you can think clearly and swallow safely, follow the 15:15 rule:  Take 15 grams of a fast-acting carb (carbohydrate). Talk with your doctor about how much you should take.  Always keep a source of fast-acting carb with you, such as: ? Sugar tablets  (glucose pills). Take 3-4 pills. ? 6-8 pieces of hard candy. ? 4-6 oz (120-150 mL) of fruit juice. ? 4-6 oz (120-150 mL) of regular (not diet) soda. ? 1 Tbsp (15 mL) honey or sugar.  Check your blood sugar 15 minutes after you take the carb.  If your blood sugar is still at or below 70 mg/dL (3.9 mmol/L), take 15 grams of a carb again.  If your blood sugar does not go above 70 mg/dL (3.9 mmol/L) after 3 tries, get help right away.  After your blood sugar goes back to normal, eat a meal or a snack within 1 hour.  Treating very low blood sugar If your blood sugar is at or below 54 mg/dL (3 mmol/L), you have very low blood sugar (severe hypoglycemia). This may also cause:  Passing out.  Jerky movements you cannot control (seizure).  Losing consciousness (coma). This is an emergency. Do not wait to see if the symptoms will go away. Get medical help right away. Call your local emergency services (911 in the [place] Do not drive yourself to the hospital. If you have very low blood sugar and you cannot eat or drink, you may need a glucagon shot (injection). A family member or friend should learn how to check your blood sugar and how to give you a glucagon shot. Ask your doctor if you need to have a glucagon shot kit at home. Follow these instructions at home: General instructions  Take over-the-counter and prescription  medicines only as told by your doctor.  Stay aware of your blood sugar as told by your doctor.  Limit alcohol intake to no more than 1 drink a day for nonpregnant women and 2 drinks a day for men. One drink equals 12 oz of beer (355 mL), 5 oz of wine (148 mL), or 1 oz of hard liquor (44 mL).  Keep all follow-up visits as told by your doctor. This is important. If you have diabetes:   Follow your diabetes care plan as told by your doctor. Make sure you: ? Know the signs of low blood sugar. ? Take your medicines as told. ? Follow your exercise and meal plan. ? Eat on  time. Do not skip meals. ? Check your blood sugar as often as told by your doctor. Always check it before and after exercise. ? Follow your sick day plan when you cannot eat or drink normally. Make this plan ahead of time with your doctor.  Share your diabetes care plan with: ? Your work or school. ? People you live with.  Check your pee (urine) for ketones: ? When you are sick. ? As told by your doctor.  Carry a card or wear jewelry that says you have diabetes. Contact a doctor if:  You have trouble keeping your blood sugar in your target range.  You have low blood sugar often. Get help right away if:  You still have symptoms after you eat or drink something sugary.  Your blood sugar is at or below 54 mg/dL (3 mmol/L).  You have jerky movements that you cannot control.  You pass out. These symptoms may be an emergency. Do not wait to see if the symptoms will go away. Get medical help right away. Call your local emergency services (911 in the [place] Do not drive yourself to the hospital. Summary  Hypoglycemia happens when the level of sugar (glucose) in your blood is too low.  Low blood sugar can happen to people who have diabetes and people who do not have diabetes. Low blood sugar can happen quickly, and it can be an emergency.  Make sure you know the signs of low blood sugar and know how to treat it.  Always keep a source of sugar (fast-acting carb) with you to treat low blood sugar. This information is not intended to replace advice given to you by your health care provider. Make sure you discuss any questions you have with your health care provider. Document Revised: 06/11/2018 Document Reviewed: 03/24/2015 Elsevier Patient Education  [employer].    If you have lab work done today you will be contacted with your lab results within the next 2 weeks.  If you have not heard from [place] then please contact us. The fastest way to get your results is to register for My  Chart.   IF you received an x-ray today, you will receive an invoice from [facility]. Please contact [facility] at [phone] with questions or concerns regarding your invoice.   IF you received labwork today, you will receive an invoice from [place]. Please contact [facility] at [phone] with questions or concerns regarding your invoice.   Our billing staff will not be able to assist you with questions regarding bills from these companies.  You will be contacted with the lab results as soon as they are available. The fastest way to get your results is to activate your My Chart account. Instructions are located on the last page of this paperwork.  If you have not heard from [place] regarding the results in 2 weeks, please contact this office.

## 2019-10-13 NOTE — Progress Notes (Signed)
Subjective:  Patient ID: Francis Cross, male    DOB: December 15, 1955  Age: 64 y.o. MRN: [id number]  CC:  Chief Complaint  Patient presents with  . Follow-up    on diabetes, hyperlipidemia, and hypertenstion. Pt reports his BS numbers aren't where he would like them to be. Todys BS reading was 223 mg/dl. pt states it ranges from 180-234m/dl.Other than that pt reports no issues with these conditions sincer last OV. Pt is fasting. Pt reports he is having back surgery in september and he wanted to give [place].    HPI Francis Cross for   Diabetes: Complicated by hyperglycemia, diabetic neuropathy.  Uncontrolled last visit in July.  Had decreased his dose of Basaglar on his own temporarily to 8 units/day, and returned to 16 units/day at his July 15 visit. Based on persistent elevations, Basaglar was increased to 18 units, continued Trulicity at 3 mg weekly. He is on Praluent for cholesterol, ARB with losartan, up-to-date on microalbumin, Ortho, foot exam, Pneumovax.  Previously took Lyrica, but felt like that caused depression so he had stopped that medicine without significant difference in his symptoms at his July visit. Has not had Covid vaccine, recommended previously. Still declines at this time.   Home readings ranging from 180-200, fasting 223 today.  No symptomatic lows. Lowest 130-140 in past, but not recent.  Fasting: 180-200 Postprandial: 220-150  Some congestion, runny nose after last visit, better past 3-4 days. Had negative covid test.   Recent note reviewed from his neurosurgeon, and due to compressive symptoms will be undergoing surgery in September for a fusion. 11/09/19, but on cancellation list.   Lab Results  Component Value Date   HGBA1C 8.5 (A) 09/16/2019   HGBA1C 8.8 (H) 06/14/2019   HGBA1C 7.7 (H) 01/15/2019   Lab Results  Component Value Date   MICROALBUR 0.5 09/21/2015   LDLCALC 66 09/16/2019   CREATININE 0.96 09/16/2019      History Patient  Active Problem List   Diagnosis Date Noted  . Pain in limb 07/27/2019  . Headache 07/27/2019  . Carotid stenosis 07/27/2019  . Special screening for malignant neoplasms, colon   . Difficult intubation   . Prostatitis 06/03/2018  . Paresthesia 03/11/2018  . Cough 03/04/2018  . Basal cell carcinoma of neck 12/02/2017  . Basal cell carcinoma of temple region 12/02/2017  . Herniated nucleus pulposus, L4-5 left 09/23/2017  . Degeneration of lumbar intervertebral disc 06/26/2017  . Hx of carpal tunnel repair 12/22/2015  . Testosterone deficiency 05/23/2014  . Pain in the chest 10/01/2013  . GOUT, UNSPECIFIED 01/29/2010  . Obesity 12/06/2008  . IRON OVERLOAD 08/11/2007  . HIATAL HERNIA WITH REFLUX 07/08/2006  . Hypercholesteremia 06/12/2006  . Essential hypertension 06/12/2006  . 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 Acid reflux   . Allergy   . Arthritis    hands  . Cancer ([place]    skin cancer- basal cell on nose  . Cataract   . Chronic kidney disease    Kidney stones  . Diabetes mellitus without complication ([place]   . Difficult intubation   . Erectile dysfunction   . Fatty liver   . Gout   . Hemochromatosis   . Hiatal hernia   . History of kidney stones   . Hyperlipidemia   . Hypertension   . Iron overload   . Obesity   . Sciatica    bilateral, intermittent   Past Surgical History:  Procedure Laterality Date  . APPENDECTOMY    . BACK SURGERY  09/09/2017   lumbar discectomy   . CARDIAC CATHETERIZATION     [facility]  . CATARACT EXTRACTION W/PHACO Right 12/15/2018   Procedure: CATARACT EXTRACTION PHACO AND INTRAOCULAR LENS PLACEMENT (I[facility]) RIGHT DIABETIC 00:35.7  16.3%  5.81;  Surgeon: Birder Robson, MD;  Location: [facility];  Service: Ophthalmology;  Laterality: Right;  diabetic - insulin  . CATARACT EXTRACTION W/PHACO Left 01/05/2019   Procedure: CATARACT EXTRACTION PHACO AND INTRAOCULAR LENS PLACEMENT ([place]) LEFT  DIABETIC;  Surgeon: Birder Robson, MD;  Location: [facility];  Service: Ophthalmology;  Laterality: Left;  0:31 16.4% 5.20  . CERVICAL FUSION    . COLONOSCOPY WITH PROPOFOL N/A 12/22/2018   Procedure: COLONOSCOPY WITH PROPOFOL;  Surgeon: Doran Stabler, MD;  Location: [facility];  Service: Gastroenterology;  Laterality: N/A;  . KIDNEY STONE SURGERY  03/07/2017   laser blast  . LUMBAR LAMINECTOMY/DECOMPRESSION MICRODISCECTOMY N/A 09/23/2017   Procedure: Lumbar Four-Five Redo Discectomy for epidural hematoma.;  Surgeon: Kristeen Miss, MD;  Location: [facility];  Service: Neurosurgery;  Laterality: N/A;  . TONSILLECTOMY     Allergies  Allergen Reactions  . Keflex [Cephalexin] Rash  . Penicillins Itching, Rash and Other (See Comments)    Has patient had a PCN reaction causing immediate rash, facial/tongue/throat swelling, SOB or lightheadedness with hypotension: No Has patient had a PCN reaction causing severe rash involving mucus membranes or skin necrosis: No Has patient had a PCN reaction that required hospitalization: No Has patient had a PCN reaction occurring within the last 10 years: No If all of the above answers are "NO", then may proceed with Cephalosporin use.    Prior to Admission medications   Medication Sig Start Date End Date Taking? Authorizing Provider  acetaminophen (TYLENOL) 325 MG tablet Take 650 mg by mouth every 6 (six) hours as needed for mild pain or moderate pain.   Yes [provider]  Alirocumab (PRALUENT) 150 MG/ML SOAJ Inject 150 mg into the skin every 14 (fourteen) days. 07/29/19  Yes Wellington Hampshire, MD  BD PEN NEEDLE NANO 2ND GEN 32G X 4 MM MISC FOR USE WITH BASAGLAR PEN 09/08/19  Yes [name], MD  clotrimazole (LOTRIMIN) 1 % cream Apply 1 application topically 2 (two) times daily. As needed to  affected area. 09/16/19  Yes [name], MD  Dulaglutide (TRULICITY) 3 PN/3.6RW SOPN Inject 0.5 mLs (3 mg total) as directed once a  week. 09/16/19  Yes [name], MD  Insulin Glargine [facility] KWIKPEN) 100 UNIT/ML Inject 0.18 mLs (18 Units total) into the skin daily. 09/16/19  Yes [name], MD  ketoconazole (NIZORAL) 2 % shampoo APPLY TOPICALLY 2 (TWO) TIMES A WEEK. Patient taking differently: Apply 1 application topically 2 (two) times a week.  04/11/16  Yes Shawnee Knapp, MD  ketorolac (ACULAR) 0.5 % ophthalmic solution Place 1 drop into both eyes 2 (two) times daily as needed. 04/11/16  Yes Shawnee Knapp, MD  losartan-hydrochlorothiazide (HYZAAR) 100-25 MG tablet Take 1 tablet by mouth daily. 09/16/19  Yes [name], MD  omeprazole (PRILOSEC) 20 MG capsule Take 1 capsule (20 mg total) by mouth daily. Patient taking differently: Take 20 mg by mouth as needed (Heartburn).  04/24/16  Yes Danis, Kirke Corin, MD  tamsulosin (FLOMAX) 0.4 MG CAPS capsule Take 0.4 mg by mouth as needed (Kidney stone).    Yes [provider]   Social  History   Socioeconomic History  . Marital status: Married    Spouse name: Not on file  . Number of children: 3  . Years of education: Not on file  . Highest education level: Not on file  Occupational History  . Occupation: retired  Tobacco Use  . Smoking status: Former Research scientist (life sciences)  . Smokeless tobacco: Former Systems developer  . Tobacco comment: socially as teenager  Vaping Use  . Vaping Use: Never used  Substance and Sexual Activity  . Alcohol use: Not Currently  . Drug use: No  . Sexual activity: Not on file  Other Topics Concern  . Not on file  Social History Narrative  . Not on file   Social Determinants of Health   Financial Resource Strain:   . Difficulty of Paying Living Expenses:   Food Insecurity:   . Worried About Charity fundraiser in the Last Year:   . Arboriculturist in the Last Year:   Transportation Needs:   . Film/video editor (Medical):   Marland Kitchen Lack of Transportation (Non-Medical):   Physical Activity:   . Days of Exercise per Week:   . Minutes of  Exercise per Session:   Stress:   . Feeling of Stress :   Social Connections:   . Frequency of Communication with Friends and Family:   . Frequency of Social Gatherings with Friends and Family:   . Attends Religious Services:   . Active Member of Clubs or Organizations:   . Attends Archivist Meetings:   Marland Kitchen Marital Status:   Intimate Partner Violence:   . Fear of Current or Ex-Partner:   . Emotionally Abused:   Marland Kitchen Physically Abused:   . Sexually Abused:     Review of Systems Per HPI.  Objective:   Vitals:   10/13/19 1027  BP: 127/84  Pulse: 75  Temp: 98.1 F (36.7 C)  TempSrc: Temporal  SpO2: 95%  Weight: 205 lb (93 kg)  Height: 5' 5" (1.651 m)     Physical Exam Vitals reviewed.  Constitutional:      Appearance: He is well-developed.  HENT:     Head: Normocephalic and atraumatic.  Eyes:     Pupils: Pupils are equal, round, and reactive to light.  Neck:     Vascular: No JVD.  Cardiovascular:     Rate and Rhythm: Normal rate and regular rhythm.     Heart sounds: Normal heart sounds. No murmur heard.   Pulmonary:     Effort: Pulmonary effort is normal.     Breath sounds: Normal breath sounds. No rales.  Skin:    General: Skin is warm and dry.  Neurological:     General: No focal deficit present.     Mental Status: He is alert and oriented to person, place, and time.        Assessment & Plan:  Francis Cross is a 64 y.o. male . Type 2 diabetes mellitus with diabetic neuropathy, without long-term current use of insulin (HCC)  Type 2 diabetes mellitus with hyperglycemia, without long-term current use of insulin (HCC)  Persistent elevations on 18 units Basaglar.  Will increase to 20 units then up to 22 units in 3 days if persistent readings in the 200s.  Hypoglycemia precautions given as we improved control, but with upcoming surgery we will try to maximize diabetic control.  Continue Trulicity same dose for now, update by MyChart in 2 weeks,  84-monthoffice visit for A1c  COVID-19 vaccine recommended,  declined at this time.  No orders of the defined types were placed in this encounter.  Patient Instructions    I do still recommend Covid vaccine. Please let me know if there are any specific questions or concerns I can address.   Will try to improve diabetes control, especially with upcoming surgery. Continue trulicity same dose. Increase basaglar to 20 units per day for next 3 days, then if persistent readings in the 200's, increase to 22 units. Send me you readings over the next 2 weeks. (once per day at minimum, and vary between fasting and 2 hour after meals). Do not skip meals, and watch for low blood sugars as we tighten the control. See precautions below.     Hypoglycemia Hypoglycemia is when the sugar (glucose) level in your blood is too low. Signs of low blood sugar may include:  Feeling: ? Hungry. ? Worried or nervous (anxious). ? Sweaty and clammy. ? Confused. ? Dizzy. ? Sleepy. ? Sick to your stomach (nauseous).  Having: ? A fast heartbeat. ? A headache. ? A change in your vision. ? Tingling or no feeling (numbness) around your mouth, lips, or tongue. ? Jerky movements that you cannot control (seizure).  Having trouble with: ? Moving (coordination). ? Sleeping. ? Passing out (fainting). ? Getting upset easily (irritability). Low blood sugar can happen to people who have diabetes and people who do not have diabetes. Low blood sugar can happen quickly, and it can be an emergency. Treating low blood sugar Low blood sugar is often treated by eating or drinking something sugary right away, such as:  Fruit juice, 4-6 oz (120-150 mL).  Regular soda (not diet soda), 4-6 oz (120-150 mL).  Low-fat milk, 4 oz (120 mL).  Several pieces of hard candy.  Sugar or honey, 1 Tbsp (15 mL). Treating low blood sugar if you have diabetes If you can think clearly and swallow safely, follow the 15:15 rule:  Take 15  grams of a fast-acting carb (carbohydrate). Talk with your doctor about how much you should take.  Always keep a source of fast-acting carb with you, such as: ? Sugar tablets (glucose pills). Take 3-4 pills. ? 6-8 pieces of hard candy. ? 4-6 oz (120-150 mL) of fruit juice. ? 4-6 oz (120-150 mL) of regular (not diet) soda. ? 1 Tbsp (15 mL) honey or sugar.  Check your blood sugar 15 minutes after you take the carb.  If your blood sugar is still at or below 70 mg/dL (3.9 mmol/L), take 15 grams of a carb again.  If your blood sugar does not go above 70 mg/dL (3.9 mmol/L) after 3 tries, get help right away.  After your blood sugar goes back to normal, eat a meal or a snack within 1 hour.  Treating very low blood sugar If your blood sugar is at or below 54 mg/dL (3 mmol/L), you have very low blood sugar (severe hypoglycemia). This may also cause:  Passing out.  Jerky movements you cannot control (seizure).  Losing consciousness (coma). This is an emergency. Do not wait to see if the symptoms will go away. Get medical help right away. Call your local emergency services (911 in the [place] Do not drive yourself to the hospital. If you have very low blood sugar and you cannot eat or drink, you may need a glucagon shot (injection). A family member or friend should learn how to check your blood sugar and how to give you a glucagon shot. Ask your doctor if  you need to have a glucagon shot kit at home. Follow these instructions at home: General instructions  Take over-the-counter and prescription medicines only as told by your doctor.  Stay aware of your blood sugar as told by your doctor.  Limit alcohol intake to no more than 1 drink a day for nonpregnant women and 2 drinks a day for men. One drink equals 12 oz of beer (355 mL), 5 oz of wine (148 mL), or 1 oz of hard liquor (44 mL).  Keep all follow-up visits as told by your doctor. This is important. If you have diabetes:   Follow your  diabetes care plan as told by your doctor. Make sure you: ? Know the signs of low blood sugar. ? Take your medicines as told. ? Follow your exercise and meal plan. ? Eat on time. Do not skip meals. ? Check your blood sugar as often as told by your doctor. Always check it before and after exercise. ? Follow your sick day plan when you cannot eat or drink normally. Make this plan ahead of time with your doctor.  Share your diabetes care plan with: ? Your work or school. ? People you live with.  Check your pee (urine) for ketones: ? When you are sick. ? As told by your doctor.  Carry a card or wear jewelry that says you have diabetes. Contact a doctor if:  You have trouble keeping your blood sugar in your target range.  You have low blood sugar often. Get help right away if:  You still have symptoms after you eat or drink something sugary.  Your blood sugar is at or below 54 mg/dL (3 mmol/L).  You have jerky movements that you cannot control.  You pass out. These symptoms may be an emergency. Do not wait to see if the symptoms will go away. Get medical help right away. Call your local emergency services (911 in the [place] Do not drive yourself to the hospital. Summary  Hypoglycemia happens when the level of sugar (glucose) in your blood is too low.  Low blood sugar can happen to people who have diabetes and people who do not have diabetes. Low blood sugar can happen quickly, and it can be an emergency.  Make sure you know the signs of low blood sugar and know how to treat it.  Always keep a source of sugar (fast-acting carb) with you to treat low blood sugar. This information is not intended to replace advice given to you by your health care provider. Make sure you discuss any questions you have with your health care provider. Document Revised: 06/11/2018 Document Reviewed: 03/24/2015 Elsevier Patient Education  [employer].    If you have lab work done today you  will be contacted with your lab results within the next 2 weeks.  If you have not heard from [place] then please contact us. The fastest way to get your results is to register for My Chart.   IF you received an x-ray today, you will receive an invoice from [facility]. Please contact [facility] at [phone] with questions or concerns regarding your invoice.   IF you received labwork today, you will receive an invoice from [facility]. Please contact [facility] at [phone] with questions or concerns regarding your invoice.   Our billing staff will not be able to assist you with questions regarding bills from these companies.  You will be contacted with the lab results as soon as they are available. The fastest  way to get your results is to activate your My Chart account. Instructions are located on the last page of this paperwork. If you have not heard from [place] regarding the results in 2 weeks, please contact this office.         Signed, [name], MD [facility]

## 2019-10-14 ENCOUNTER — Telehealth: Payer: Self-pay | Admitting: Neurology

## 2019-10-14 NOTE — Telephone Encounter (Signed)
I called the pt and LVM asking for call back. When he calls back, please let him know that his recent MRI brain completed October 06, 2019 was normal for age, no concerns.

## 2019-10-14 NOTE — Telephone Encounter (Signed)
Noted thanks °

## 2019-10-14 NOTE — Telephone Encounter (Signed)
Pt called and message from [facility] was relayed to him. Pt has not requested a call back

## 2019-10-14 NOTE — Telephone Encounter (Signed)
MRI of the brain with and without contrast completed October 06, 2019 at [facility] was normal for age.

## 2019-10-18 ENCOUNTER — Telehealth: Payer: Self-pay | Admitting: Cardiovascular Disease

## 2019-10-18 NOTE — Telephone Encounter (Signed)
Left message for patient to call to discuss scheduling with ENT (Dr. Clyde Canterbury) per Dr. Tyrell Antonio request

## 2019-11-02 ENCOUNTER — Other Ambulatory Visit: Payer: Self-pay

## 2019-11-02 ENCOUNTER — Ambulatory Visit (INDEPENDENT_AMBULATORY_CARE_PROVIDER_SITE_OTHER): Payer: BC Managed Care – PPO | Admitting: Cardiovascular Disease

## 2019-11-02 ENCOUNTER — Encounter: Payer: Self-pay | Admitting: Cardiovascular Disease

## 2019-11-02 VITALS — BP 158/100 | HR 77 | Ht 65.0 in | Wt 207.0 lb

## 2019-11-02 DIAGNOSIS — E785 Hyperlipidemia, unspecified: Secondary | ICD-10-CM | POA: Diagnosis not present

## 2019-11-02 DIAGNOSIS — I2510 Atherosclerotic heart disease of native coronary artery without angina pectoris: Secondary | ICD-10-CM

## 2019-11-02 DIAGNOSIS — I10 Essential (primary) hypertension: Secondary | ICD-10-CM

## 2019-11-02 NOTE — Patient Instructions (Signed)
Medication Instructions:  Your physician recommends that you continue on your current medications as directed. Please refer to the Current Medication list given to you today.  *If you need a refill on your cardiac medications before your next appointment, please call your pharmacy*  Lab Work: NONE   Testing/Procedures: NONE  Follow-Up: At [facility], you and your health needs are our priority.  As part of our continuing mission to provide you with exceptional heart care, we have created designated Provider Care Teams.  These Care Teams include your primary Cardiologist (physician) and Advanced Practice Providers (APPs -  Physician Assistants and Nurse Practitioners) who all work together to provide you with the care you need, when you need it.  We recommend signing up for the patient portal called "MyChart".  Sign up information is provided on this After Visit Summary.  MyChart is used to connect with patients for Virtual Visits (Telemedicine).  Patients are able to view lab/test results, encounter notes, upcoming appointments, etc.  Non-urgent messages can be sent to your provider as well.   To learn more about what you can do with MyChart, go to [email or web address].    Your next appointment:   6 month(s)  The format for your next appointment:   In Person  Provider:   You may see Kathlyn Sacramento, MD or one of the following Advanced Practice Providers on your designated Care Team:    Kerin Ransom, PA-C  [place], [place]  Coletta Memos, [place]

## 2019-11-02 NOTE — Progress Notes (Addendum)
Cardiology Office Note   Date:  11/02/2019   ID:  Francis Cross, Francis Cross [birth year], MRN [id number]  PCP:  [name], MD  Cardiologist:   Kathlyn Sacramento, MD   No chief complaint on file.     History of Present Illness: Francis Cross is a 64 y.o. male who is here today for regarding severe hyperlipidemia and mild LAD plaque noted on cardiac CTA done in 2019. He has known history of hypertension, hyperlipidemia and type 2 diabetes.  Previous nuclear stress test in 2015 was normal.  He has known history of intermittent atypical left-sided chest pains thought to be musculoskeletal. He has known history of severe hyperlipidemia with intolerance to statins and Zetia.    CTA of the coronary arteries in August 2019 showed a calcium score of 3 with mild soft plaque in the proximal LAD.  His blood pressure was not controlled during last visit, I increased the dose of losartan-hydrochlorothiazide.  Renal artery duplex was done which showed no evidence of renal artery stenosis. He also complained of increased fatigue and shortness of breath.  Thus, an echocardiogram was done which showed normal LV systolic function, grade 1 diastolic dysfunction and no significant valvular abnormalities. He had an ABI done which was normal and carotid Doppler showed mild nonobstructive disease. His blood pressure was controlled up until he started having severe low back pain and was diagnosed with herniated disc.  He is scheduled to have surgery done next week.  He is taking ibuprofen 800 mg as needed for pain.    Past Medical History:  Diagnosis Date   Acid reflux    Allergy    Arthritis    hands   Cancer ([place])    skin cancer- basal cell on nose   Cataract    Chronic kidney disease    Kidney stones   Diabetes mellitus without complication ([place])    Difficult intubation    Erectile dysfunction    Fatty liver    Gout    Hemochromatosis    Hiatal hernia    History of kidney stones     Hyperlipidemia    Hypertension    Iron overload    Obesity    Sciatica    bilateral, intermittent    Past Surgical History:  Procedure Laterality Date   APPENDECTOMY     BACK SURGERY  09/09/2017   lumbar discectomy    CARDIAC CATHETERIZATION     [facility]   CATARACT EXTRACTION W/PHACO Right 12/15/2018   Procedure: CATARACT EXTRACTION PHACO AND INTRAOCULAR LENS PLACEMENT ([facility]) RIGHT DIABETIC 00:35.7  16.3%  5.81;  Surgeon: Birder Robson, MD;  Location: [place];  Service: Ophthalmology;  Laterality: Right;  diabetic - insulin   CATARACT EXTRACTION W/PHACO Left 01/05/2019   Procedure: CATARACT EXTRACTION PHACO AND INTRAOCULAR LENS PLACEMENT ([place]) LEFT DIABETIC;  Surgeon: Birder Robson, MD;  Location: [place];  Service: Ophthalmology;  Laterality: Left;  0:31 16.4% 5.20   CERVICAL FUSION     COLONOSCOPY WITH PROPOFOL N/A 12/22/2018   Procedure: COLONOSCOPY WITH PROPOFOL;  Surgeon: Doran Stabler, MD;  Location: [facility];  Service: Gastroenterology;  Laterality: N/A;   KIDNEY STONE SURGERY  03/07/2017   laser blast   LUMBAR LAMINECTOMY/DECOMPRESSION MICRODISCECTOMY N/A 09/23/2017   Procedure: Lumbar Four-Five Redo Discectomy for epidural hematoma.;  Surgeon: Kristeen Miss, MD;  Location: [place];  Service: Neurosurgery;  Laterality: N/A;   TONSILLECTOMY       Current Outpatient Medications  Medication Sig  Dispense Refill   acetaminophen (TYLENOL) 325 MG tablet Take 650 mg by mouth every 6 (six) hours as needed for mild pain or moderate pain.      Alirocumab (PRALUENT) 150 MG/ML SOAJ Inject 150 mg into the skin every 14 (fourteen) days. 2 pen 11   BD PEN NEEDLE NANO 2ND GEN 32G X 4 MM MISC FOR USE WITH BASAGLAR PEN 100 each 3   clotrimazole (LOTRIMIN) 1 % cream Apply 1 application topically 2 (two) times daily. As needed to  affected area. 30 g 1   Dulaglutide (TRULICITY) 3 HD/6.2IW SOPN Inject 0.5 mLs (3 mg total) as directed once a  week. 6 mL 1   ibuprofen (ADVIL) 800 MG tablet Take 800 mg by mouth 3 (three) times daily as needed for moderate pain.      Insulin Glargine (BASAGLAR KWIKPEN) 100 UNIT/ML Inject 0.18 mLs (18 Units total) into the skin daily. (Patient taking differently: Inject 20 Units into the skin daily. ) 15 pen 1   ketoconazole (NIZORAL) 2 % shampoo APPLY TOPICALLY 2 (TWO) TIMES A WEEK. 120 mL 1   ketorolac (ACULAR) 0.5 % ophthalmic solution Place 1 drop into both eyes 2 (two) times daily as needed. 5 mL 1   losartan-hydrochlorothiazide (HYZAAR) 100-25 MG tablet Take 1 tablet by mouth daily. 90 tablet 1   omeprazole (PRILOSEC) 20 MG capsule Take 1 capsule (20 mg total) by mouth daily. 20 capsule 0   omeprazole (PRILOSEC) 40 MG capsule Take 40 mg by mouth daily.     tamsulosin (FLOMAX) 0.4 MG CAPS capsule Take 0.4 mg by mouth daily.      No current facility-administered medications for this visit.    Allergies:   Keflex [cephalexin] and Penicillins    Social History:  The patient  reports that he has quit smoking. He has quit using smokeless tobacco. He reports previous alcohol use. He reports that he does not use drugs.   Family History:  The patient's family history includes Hyperlipidemia in his sister; Hypertension in his sister; Lung cancer in his father; Melanoma in his mother.    ROS:  Please see the history of present illness.   Otherwise, review of systems are positive for none.   All other systems are reviewed and negative.    PHYSICAL EXAM: VS:  BP (!) 158/100    Pulse 77    Ht 5\' 5"  (1.651 m)    Wt 207 lb (93.9 kg)    BMI 34.45 kg/m  , BMI Body mass index is 34.45 kg/m. GEN: Well nourished, well developed, in no acute distress  HEENT: normal  Neck: no JVD, carotid bruits, or masses Cardiac: RRR; no murmurs, rubs, or gallops,no edema  Respiratory:  clear to auscultation bilaterally, normal work of breathing GI: soft, nontender, nondistended, + BS MS: no deformity or atrophy   Skin: warm and dry, no rash Neuro:  Strength and sensation are intact Psych: euthymic mood, full affect   EKG:  EKG is ordered today. EKG showed normal sinus rhythm with a PVC.  No significant ST or T wave changes.   Recent Labs: 06/14/2019: Hemoglobin 14.9; Platelets 184; TSH 2.170 09/16/2019: ALT 54; BUN 11; Creatinine, Ser 0.96; Potassium 3.9; Sodium 139    Lipid Panel    Component Value Date/Time   CHOL 142 09/16/2019 1123   TRIG 135 09/16/2019 1123   HDL 52 09/16/2019 1123   CHOLHDL 2.7 09/16/2019 1123   CHOLHDL 2.6 11/28/2016 0839   VLDL 29 07/16/2016 1034  LDLCALC 66 09/16/2019 1123   LDLCALC 52 11/28/2016 0839   LDLDIRECT 152.1 10/12/2008 0726      Wt Readings from Last 3 Encounters:  11/02/19 207 lb (93.9 kg)  10/13/19 205 lb (93 kg)  09/22/19 207 lb (93.9 kg)       No flowsheet data found.    ASSESSMENT AND PLAN:  1. Mild coronary atherosclerosis: Noted on previous CTA with no obstructive disease.  Continue treatment of risk factors.  Echocardiogram showed normal LV systolic function.  2. Severe hyperlipidemia: He was switched from [place] to [employer] reasons.  Most recent lipid profile showed an LDL of 66.  3. Essential hypertension: I repeated his blood pressure and it was 142/88.  Suspect that his blood pressure is elevated due to low back pain and the use of high-dose ibuprofen.  He is scheduled for surgery next week.  I made no changes in his medications.  4.  Preop cardiovascular evaluation for back surgery, patient has no anginal symptoms and good functional capacity.  Previous cardiac work-up was unremarkable.  He does not require further ischemic cardiac evaluation and should be considered at low risk from a cardiovascular standpoint.  He is currently not on any antiplatelet or anticoagulant medication and has been instructed to stop using ibuprofen.   Disposition:   FU with me in 6 months.  Signed,  Kathlyn Sacramento, MD  11/02/2019  8:50 AM    [place]

## 2019-11-04 NOTE — Pre-Procedure Instructions (Signed)
Your procedure is scheduled on Tuesday, September 7th.  Report to [facility] Main Entrance "A" at 5:30 A.M., and check in at the Admitting office.  Call this number if you have problems the morning of surgery:  [phone]  Call [phone] if you have any questions prior to your surgery date Monday-Friday 8am-4pm    Remember:  Do not eat after midnight the night before your surgery    Take these medicines the morning of surgery with A SIP OF WATER  omeprazole (PRILOSEC) tamsulosin (FLOMAX)   As needed: acetaminophen (TYLENOL) Eye drops as needed  As of today, STOP taking any Aspirin (unless otherwise instructed by your surgeon) Aleve, Naproxen, Ibuprofen, Motrin, Advil, Goody's, BC's, all herbal medications, fish oil, and all vitamins.   WHAT DO I DO ABOUT MY DIABETES MEDICATION?   Marland Kitchen Do not take oral diabetes medicines (pills) the morning of surgery.  . THE MORNING OF SURGERY, take         10          units of Insulin Glargine (BASAGLAR KWIKPEN) insulin.  . The day of surgery, do not take Trulicity (dulaglutide).  . If your CBG is greater than 220 mg/dL, you may take  of your sliding scale (correction) dose of insulin.   HOW TO MANAGE YOUR DIABETES BEFORE AND AFTER SURGERY  Why is it important to control my blood sugar before and after surgery? . Improving blood sugar levels before and after surgery helps healing and can limit problems. . A way of improving blood sugar control is eating a healthy diet by: o  Eating less sugar and carbohydrates o  Increasing activity/exercise o  Talking with your doctor about reaching your blood sugar goals . High blood sugars (greater than 180 mg/dL) can raise your risk of infections and slow your recovery, so you will need to focus on controlling your diabetes during the weeks before surgery. . Make sure that the doctor who takes care of your diabetes knows about your planned surgery including the date and location.  How do I  manage my blood sugar before surgery? . Check your blood sugar at least 4 times a day, starting 2 days before surgery, to make sure that the level is not too high or low. . Check your blood sugar the morning of your surgery when you wake up and every 2 hours until you get to the Short Stay unit. o If your blood sugar is less than 70 mg/dL, you will need to treat for low blood sugar: - Do not take insulin. - Treat a low blood sugar (less than 70 mg/dL) with  cup of clear juice (cranberry or apple), 4 glucose tablets, OR glucose gel. - Recheck blood sugar in 15 minutes after treatment (to make sure it is greater than 70 mg/dL). If your blood sugar is not greater than 70 mg/dL on recheck, call [phone] for further instructions. . Report your blood sugar to the short stay nurse when you get to Short Stay.  . If you are admitted to the hospital after surgery: o Your blood sugar will be checked by the staff and you will probably be given insulin after surgery (instead of oral diabetes medicines) to make sure you have good blood sugar levels. o The goal for blood sugar control after surgery is 80-180 mg/dL.              Do not wear jewelry.            Do  not wear lotions, powders, colognes, or deodorant.            Men may shave face and neck.            Do not bring valuables to the hospital.            [facility] is not responsible for any belongings or valuables.  Do NOT Smoke (Tobacco/Vaping) or drink Alcohol 24 hours prior to your procedure If you use a CPAP at night, you may bring all equipment for your overnight stay.   Contacts, glasses, dentures or bridgework may not be worn into surgery.      For patients admitted to the hospital, discharge time will be determined by your treatment team.   Patients discharged the day of surgery will not be allowed to drive home, and someone needs to stay with them for 24 hours.    Special instructions:   [place]- Preparing For  Surgery  Before surgery, you can play an important role. Because skin is not sterile, your skin needs to be as free of germs as possible. You can reduce the number of germs on your skin by washing with CHG (chlorahexidine gluconate) Soap before surgery.  CHG is an antiseptic cleaner which kills germs and bonds with the skin to continue killing germs even after washing.    Oral Hygiene is also important to reduce your risk of infection.  Remember - BRUSH YOUR TEETH THE MORNING OF SURGERY WITH YOUR REGULAR TOOTHPASTE  Please do not use if you have an allergy to CHG or antibacterial soaps. If your skin becomes reddened/irritated stop using the CHG.  Do not shave (including legs and underarms) for at least 48 hours prior to first CHG shower. It is OK to shave your face.  Please follow these instructions carefully.   1. Shower the NIGHT BEFORE SURGERY and the MORNING OF SURGERY with CHG Soap.   2. If you chose to wash your hair, wash your hair first as usual with your normal shampoo.  3. After you shampoo, rinse your hair and body thoroughly to remove the shampoo.  4. Use CHG as you would any other liquid soap. You can apply CHG directly to the skin and wash gently with a scrungie or a clean washcloth.   5. Apply the CHG Soap to your body ONLY FROM THE NECK DOWN.  Do not use on open wounds or open sores. Avoid contact with your eyes, ears, mouth and genitals (private parts). Wash Face and genitals (private parts)  with your normal soap.   6. Wash thoroughly, paying special attention to the area where your surgery will be performed.  7. Thoroughly rinse your body with warm water from the neck down.  8. DO NOT shower/wash with your normal soap after using and rinsing off the CHG Soap.  9. Pat yourself dry with a CLEAN TOWEL.  10. Wear CLEAN PAJAMAS to bed the night before surgery  11. Place CLEAN SHEETS on your bed the night of your first shower and DO NOT SLEEP WITH PETS.   Day of  Surgery: Wear Clean/Comfortable clothing the morning of surgery Do not apply any deodorants/lotions.   Remember to brush your teeth WITH YOUR REGULAR TOOTHPASTE.   Please read over the following fact sheets that you were given.

## 2019-11-05 ENCOUNTER — Other Ambulatory Visit (HOSPITAL_COMMUNITY)
Admission: RE | Admit: 2019-11-05 | Discharge: 2019-11-05 | Disposition: A | Discharge status: Home / Self Care | Payer: BC Managed Care – PPO | Source: Ambulatory Visit | Attending: Neurological Surgery | Admitting: Neurological Surgery

## 2019-11-05 ENCOUNTER — Encounter (HOSPITAL_COMMUNITY)
Admission: RE | Admit: 2019-11-05 | Discharge: 2019-11-05 | Disposition: A | Discharge status: Home / Self Care | Payer: BC Managed Care – PPO | Source: Ambulatory Visit | Attending: Neurological Surgery | Admitting: Neurological Surgery

## 2019-11-05 ENCOUNTER — Other Ambulatory Visit: Payer: Self-pay

## 2019-11-05 ENCOUNTER — Encounter (HOSPITAL_COMMUNITY): Payer: Self-pay

## 2019-11-05 DIAGNOSIS — Z01812 Encounter for preprocedural laboratory examination: Secondary | ICD-10-CM | POA: Insufficient documentation

## 2019-11-05 DIAGNOSIS — Z20822 Contact with and (suspected) exposure to covid-19: Secondary | ICD-10-CM | POA: Insufficient documentation

## 2019-11-05 DIAGNOSIS — E785 Hyperlipidemia, unspecified: Secondary | ICD-10-CM | POA: Diagnosis not present

## 2019-11-05 LAB — SURGICAL PCR SCREEN
MRSA, PCR: NEGATIVE
Staphylococcus aureus: NEGATIVE

## 2019-11-05 LAB — BASIC METABOLIC PANEL WITH GFR
Anion gap: 12 (ref 5–15)
BUN: 13 mg/dL (ref 8–23)
CO2: 25 mmol/L (ref 22–32)
Calcium: 9.6 mg/dL (ref 8.9–10.3)
Chloride: 99 mmol/L (ref 98–111)
Creatinine, Ser: 0.93 mg/dL (ref 0.61–1.24)
GFR calc Af Amer: >60 mL/min
GFR calc non Af Amer: >60 mL/min
Glucose, Bld: 143 mg/dL — ABNORMAL HIGH (ref 70–99)
Potassium: 3.3 mmol/L — ABNORMAL LOW (ref 3.5–5.1)
Sodium: 136 mmol/L (ref 135–145)

## 2019-11-05 LAB — CBC
HCT: 46.3 % (ref 39.0–52.0)
Hemoglobin: 15.6 g/dL (ref 13.0–17.0)
MCH: 30.6 pg (ref 26.0–34.0)
MCHC: 33.7 g/dL (ref 30.0–36.0)
MCV: 91 fL (ref 80.0–100.0)
Platelets: 194 10*3/uL (ref 150–400)
RBC: 5.09 MIL/uL (ref 4.22–5.81)
RDW: 12.3 % (ref 11.5–15.5)
WBC: 6.9 10*3/uL (ref 4.0–10.5)
nRBC: 0 % (ref 0.0–0.2)

## 2019-11-05 LAB — TYPE AND SCREEN
ABO/RH(D): A POS
Antibody Screen: NEGATIVE

## 2019-11-05 LAB — SARS CORONAVIRUS 2 (TAT 6-24 HRS): SARS Coronavirus 2: NEGATIVE

## 2019-11-05 LAB — GLUCOSE, CAPILLARY: Glucose-Capillary: 159 mg/dL — ABNORMAL HIGH (ref 70–99)

## 2019-11-05 NOTE — Progress Notes (Signed)
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  Elective video laryngoscope used for lumbar surgery 09/23/2017, anesthesia notes in epic.  Patient states he was recently evaluated by ENT and no abnormalities were noted.  Follows with cardiology for history of severe hyperlipidemia and mild LAD plaque noted on cardiac CTA done in 2019.  Echo 05/17/2019 showed EF 60-65%, grade 1 DD, no significant valvular normalities.  Patient was cleared for surgery by Dr. Fletcher Anon on 11/02/2019, "Preop cardiovascular evaluation for back surgery, patient has no anginal symptoms and good functional capacity.  Previous cardiac work-up was unremarkable.  He does not require further ischemic cardiac evaluation and should be considered at low risk from a cardiovascular standpoint.  He is currently not on any antiplatelet or anticoagulant medication and has been instructed to stop using ibuprofen."  IDDM 2, last A1c 8.5 on 09/16/2019.  Preop labs reviewed, unremarkable.  EKG 11/02/2019: Sinus rhythm with occasional premature ventricular complexes.  Rate 77.  TTE 05/17/2019: 1. Left ventricular ejection fraction, by estimation, is 60 to 65%. The  left ventricle has normal function. The left ventricle has no regional  wall motion abnormalities. Left ventricular diastolic parameters are  consistent with Grade I diastolic  dysfunction (impaired relaxation).  2. Right ventricular systolic function is normal. The right ventricular  size is normal.  3. The mitral valve is normal in structure. Trivial mitral valve  regurgitation.  4. The aortic valve is grossly normal. Aortic valve regurgitation is not  visualized.  5. The inferior vena cava is normal in size with greater than 50%  respiratory variability, suggesting right atrial pressure of 3 mmHg.   CT coronary morphology 10/31/2017: IMPRESSION: 1. Coronary artery calcium score 3 Agatston units. This places the patient in the  31st percentile for age and gender, suggesting low risk for future cardiac events.  2.  Possible soft plaque proximal LAD with mild stenosis.   Wynonia Musty [facility] Short Stay Center/Anesthesiology Phone [phone] 11/05/2019 2:24 PM

## 2019-11-05 NOTE — Anesthesia Preprocedure Evaluation (Addendum)
Anesthesia Evaluation  Patient identified by MRN, date of birth, ID band Patient awake    Reviewed: Allergy & Precautions, H&P , NPO status , Patient's Chart, lab work & pertinent test results  History of Anesthesia Complications (+) DIFFICULT AIRWAY and history of anesthetic complications  Airway Mallampati: III   Neck ROM: full    Dental  (+) Caps, Teeth Intact, Dental Advisory Given   Pulmonary former smoker,    breath sounds clear to auscultation       Cardiovascular hypertension,  Rhythm:regular Rate:Normal     Neuro/Psych  Headaches,  Neuromuscular disease    GI/Hepatic hiatal hernia, GERD  ,  Endo/Other  diabetes, Type 2  Renal/GU stones     Musculoskeletal  (+) Arthritis ,   Abdominal   Peds  Hematology   Anesthesia Other Findings   Reproductive/Obstetrics                           Anesthesia Physical Anesthesia Plan  ASA: II  Anesthesia Plan: General   Post-op Pain Management:    Induction: Intravenous  PONV Risk Score and Plan: 2 and Ondansetron, Dexamethasone, Midazolam and Treatment may vary due to age or medical condition  Airway Management Planned: Oral ETT and Video Laryngoscope Planned  Additional Equipment:   Intra-op Plan:   Post-operative Plan: Extubation in OR  Informed Consent: I have reviewed the patients History and Physical, chart, labs and discussed the procedure including the risks, benefits and alternatives for the proposed anesthesia with the patient or authorized representative who has indicated his/her understanding and acceptance.       Plan Discussed with: CRNA, Anesthesiologist and Surgeon  Anesthesia Plan Comments: (PAT note by Karoline Caldwell, PA-C: Documented history of difficult airway due to anterior larynx.  Elective video laryngoscope used for lumbar surgery 09/23/2017, anesthesia notes in epic.  Patient states he was recently evaluated  by ENT and no abnormalities were noted.  Follows with cardiology for history of severe hyperlipidemia and mild LAD plaque noted on cardiac CTA done in 2019.  Echo 05/17/2019 showed EF 60-65%, grade 1 DD, no significant valvular normalities.  Patient was cleared for surgery by Dr. Fletcher Anon on 11/02/2019, "Preop cardiovascular evaluation for back surgery, patient has no anginal symptoms and good functional capacity.  Previous cardiac work-up was unremarkable.  He does not require further ischemic cardiac evaluation and should be considered at low risk from a cardiovascular standpoint.  He is currently not on any antiplatelet or anticoagulant medication and has been instructed to stop using ibuprofen."  IDDM 2, last A1c 8.5 on 09/16/2019.  Preop labs reviewed, unremarkable.  EKG 11/02/2019: Sinus rhythm with occasional premature ventricular complexes.  Rate 77.  TTE 05/17/2019: 1. Left ventricular ejection fraction, by estimation, is 60 to 65%. The  left ventricle has normal function. The left ventricle has no regional  wall motion abnormalities. Left ventricular diastolic parameters are  consistent with Grade I diastolic  dysfunction (impaired relaxation).  2. Right ventricular systolic function is normal. The right ventricular  size is normal.  3. The mitral valve is normal in structure. Trivial mitral valve  regurgitation.  4. The aortic valve is grossly normal. Aortic valve regurgitation is not  visualized.  5. The inferior vena cava is normal in size with greater than 50%  respiratory variability, suggesting right atrial pressure of 3 mmHg.   CT coronary morphology 10/31/2017: IMPRESSION: 1. Coronary artery calcium score 3 Agatston units. This places the patient  in the 31st percentile for age and gender, suggesting low risk for future cardiac events.  2.  Possible soft plaque proximal LAD with mild stenosis.  )       Anesthesia Quick Evaluation

## 2019-11-05 NOTE — Progress Notes (Addendum)
PCP - Dr. Corliss Parish Cardiologist - Dr. Fletcher Anon  Chest x-ray - 09/16/19 EKG - 11/02/19 Stress Test - 2015 ECHO - 05/17/19 Cardiac Cath - 2004-negative  Sleep Study - denies CPAP - denies  Fasting Blood Sugar - 150-170 Checks Blood Sugar __3___ times a day CBG at PAT-159 Last A1C on 09/16/19-8.5  Blood Thinner Instructions:n/a Aspirin Instructions:n/a  ERAS Protcol -n/a PRE-SURGERY Ensure or G2- n/a  COVID TEST- 11/05/19 after PAT appointment   Anesthesia review: Yes, hx of difficult intubation-James Burns, PA-C aware. Pt stated that he was seen by [facility] ENT last week and was told that no blockages or obstructions were found. Requested last office note from [facility] ENT, Dr. Richardson Landry. Cardiac clearance received on 11/02/19.   Patient denies shortness of breath, fever, cough and chest pain at PAT appointment   All instructions explained to the patient, with a verbal understanding of the material. Patient agrees to go over the instructions while at home for a better understanding. Patient also instructed to self quarantine after being tested for COVID-19. The opportunity to ask questions was provided.   Coronavirus Screening  Have you experienced the following symptoms:  Cough yes/no: No Fever (>100.7F)  yes/no: No Runny nose yes/no: No Sore throat yes/no: No Difficulty breathing/shortness of breath  yes/no: No  Have you or a family member traveled in the last 14 days and where? yes/no: No   If the patient indicates "YES" to the above questions, their PAT will be rescheduled to limit the exposure to others and, the surgeon will be notified. THE PATIENT WILL NEED TO BE ASYMPTOMATIC FOR 14 DAYS.   If the patient is not experiencing any of these symptoms, the PAT nurse will instruct them to NOT bring anyone with them to their appointment since they may have these symptoms or traveled as well.   Please remind your patients and families that hospital visitation restrictions are  in effect and the importance of the restrictions.

## 2019-11-09 ENCOUNTER — Inpatient Hospital Stay (HOSPITAL_COMMUNITY): Payer: BC Managed Care – PPO | Admitting: Certified Registered"

## 2019-11-09 ENCOUNTER — Encounter (HOSPITAL_COMMUNITY): Payer: Self-pay | Admitting: Neurological Surgery

## 2019-11-09 ENCOUNTER — Other Ambulatory Visit: Payer: Self-pay

## 2019-11-09 ENCOUNTER — Inpatient Hospital Stay (HOSPITAL_COMMUNITY): Payer: BC Managed Care – PPO

## 2019-11-09 ENCOUNTER — Inpatient Hospital Stay (HOSPITAL_COMMUNITY): Payer: BC Managed Care – PPO | Admitting: Vascular Surgery

## 2019-11-09 ENCOUNTER — Inpatient Hospital Stay (HOSPITAL_COMMUNITY)
Admission: RE | Admit: 2019-11-09 | Discharge: 2019-11-10 | DRG: 455 | Disposition: A | Discharge status: Home / Self Care | Payer: BC Managed Care – PPO | Attending: Neurological Surgery | Admitting: Neurological Surgery

## 2019-11-09 ENCOUNTER — Encounter (HOSPITAL_COMMUNITY)
Admission: RE | Disposition: A | Discharge status: Home / Self Care | Payer: Self-pay | Source: Home / Self Care | Attending: Neurological Surgery

## 2019-11-09 DIAGNOSIS — I10 Essential (primary) hypertension: Secondary | ICD-10-CM | POA: Diagnosis present

## 2019-11-09 DIAGNOSIS — M4316 Spondylolisthesis, lumbar region: Secondary | ICD-10-CM | POA: Diagnosis present

## 2019-11-09 DIAGNOSIS — Z79899 Other long term (current) drug therapy: Secondary | ICD-10-CM | POA: Diagnosis not present

## 2019-11-09 DIAGNOSIS — Z794 Long term (current) use of insulin: Secondary | ICD-10-CM | POA: Diagnosis not present

## 2019-11-09 DIAGNOSIS — Z881 Allergy status to other antibiotic agents status: Secondary | ICD-10-CM

## 2019-11-09 DIAGNOSIS — Z8249 Family history of ischemic heart disease and other diseases of the circulatory system: Secondary | ICD-10-CM

## 2019-11-09 DIAGNOSIS — E119 Type 2 diabetes mellitus without complications: Secondary | ICD-10-CM | POA: Diagnosis present

## 2019-11-09 DIAGNOSIS — M4310 Spondylolisthesis, site unspecified: Secondary | ICD-10-CM

## 2019-11-09 DIAGNOSIS — Z83438 Family history of other disorder of lipoprotein metabolism and other lipidemia: Secondary | ICD-10-CM | POA: Diagnosis not present

## 2019-11-09 DIAGNOSIS — K219 Gastro-esophageal reflux disease without esophagitis: Secondary | ICD-10-CM | POA: Diagnosis present

## 2019-11-09 DIAGNOSIS — Z87891 Personal history of nicotine dependence: Secondary | ICD-10-CM

## 2019-11-09 DIAGNOSIS — M5116 Intervertebral disc disorders with radiculopathy, lumbar region: Principal | ICD-10-CM | POA: Diagnosis present

## 2019-11-09 DIAGNOSIS — Z85828 Personal history of other malignant neoplasm of skin: Secondary | ICD-10-CM | POA: Diagnosis not present

## 2019-11-09 DIAGNOSIS — Z419 Encounter for procedure for purposes other than remedying health state, unspecified: Secondary | ICD-10-CM

## 2019-11-09 DIAGNOSIS — E785 Hyperlipidemia, unspecified: Secondary | ICD-10-CM | POA: Diagnosis present

## 2019-11-09 DIAGNOSIS — Z808 Family history of malignant neoplasm of other organs or systems: Secondary | ICD-10-CM | POA: Diagnosis not present

## 2019-11-09 DIAGNOSIS — Z880 Allergy status to penicillin: Secondary | ICD-10-CM

## 2019-11-09 LAB — GLUCOSE, CAPILLARY
Glucose-Capillary: 120 mg/dL — ABNORMAL HIGH (ref 70–99)
Glucose-Capillary: 154 mg/dL — ABNORMAL HIGH (ref 70–99)
Glucose-Capillary: 160 mg/dL — ABNORMAL HIGH (ref 70–99)
Glucose-Capillary: 216 mg/dL — ABNORMAL HIGH (ref 70–99)
Glucose-Capillary: 272 mg/dL — ABNORMAL HIGH (ref 70–99)
Glucose-Capillary: 328 mg/dL — ABNORMAL HIGH (ref 70–99)

## 2019-11-09 LAB — ABO/RH: ABO/RH(D): A POS

## 2019-11-09 SURGERY — POSTERIOR LUMBAR FUSION 1 LEVEL
Anesthesia: General | Site: Back

## 2019-11-09 MED ORDER — PANTOPRAZOLE SODIUM 40 MG PO TBEC
80.0000 mg | DELAYED_RELEASE_TABLET | Freq: Every day | ORAL | Status: DC
  Administered 2019-11-10: 80 mg via ORAL
  Filled 2019-11-09: qty 2

## 2019-11-09 MED ORDER — FENTANYL CITRATE (PF) 100 MCG/2ML IJ SOLN
25.0000 ug | INTRAMUSCULAR | Status: DC | PRN
  Administered 2019-11-09 (×2): 50 ug via INTRAVENOUS

## 2019-11-09 MED ORDER — EPHEDRINE 5 MG/ML INJ
INTRAVENOUS | Status: AC
  Filled 2019-11-09: qty 10

## 2019-11-09 MED ORDER — SODIUM CHLORIDE 0.9 % IV SOLN: 250.0000 mL | INTRAVENOUS | Status: DC

## 2019-11-09 MED ORDER — INSULIN ASPART 100 UNIT/ML ~~LOC~~ SOLN
0.0000 [IU] | Freq: Every day | SUBCUTANEOUS | Status: DC
  Administered 2019-11-09: 4 [IU] via SUBCUTANEOUS

## 2019-11-09 MED ORDER — INSULIN ASPART 100 UNIT/ML ~~LOC~~ SOLN: 0.0000 [IU] | Freq: Three times a day (TID) | SUBCUTANEOUS | Status: DC

## 2019-11-09 MED ORDER — 0.9 % SODIUM CHLORIDE (POUR BTL) OPTIME
TOPICAL | Status: DC | PRN
  Administered 2019-11-09: 1000 mL

## 2019-11-09 MED ORDER — GLYCOPYRROLATE PF 0.2 MG/ML IJ SOSY
PREFILLED_SYRINGE | INTRAMUSCULAR | Status: DC | PRN
  Administered 2019-11-09: .1 mg via INTRAVENOUS

## 2019-11-09 MED ORDER — MIDAZOLAM HCL 5 MG/5ML IJ SOLN
INTRAMUSCULAR | Status: DC | PRN
  Administered 2019-11-09: 2 mg via INTRAVENOUS

## 2019-11-09 MED ORDER — LACTATED RINGERS IV SOLN: INTRAVENOUS | Status: DC

## 2019-11-09 MED ORDER — DEXMEDETOMIDINE (PRECEDEX) IN NS 20 MCG/5ML (4 MCG/ML) IV SYRINGE
PREFILLED_SYRINGE | INTRAVENOUS | Status: AC
  Filled 2019-11-09: qty 10

## 2019-11-09 MED ORDER — BUPIVACAINE HCL (PF) 0.5 % IJ SOLN
INTRAMUSCULAR | Status: AC
  Filled 2019-11-09: qty 30

## 2019-11-09 MED ORDER — SUGAMMADEX SODIUM 200 MG/2ML IV SOLN
INTRAVENOUS | Status: DC | PRN
  Administered 2019-11-09: 350 mg via INTRAVENOUS

## 2019-11-09 MED ORDER — DEXMEDETOMIDINE (PRECEDEX) IN NS 20 MCG/5ML (4 MCG/ML) IV SYRINGE
PREFILLED_SYRINGE | INTRAVENOUS | Status: DC | PRN
  Administered 2019-11-09: 20 ug via INTRAVENOUS

## 2019-11-09 MED ORDER — LIDOCAINE-EPINEPHRINE 1 %-1:100000 IJ SOLN
INTRAMUSCULAR | Status: AC
  Filled 2019-11-09: qty 1

## 2019-11-09 MED ORDER — PHENOL 1.4 % MT LIQD
1.0000 | OROMUCOSAL | Status: DC | PRN
  Filled 2019-11-09: qty 177

## 2019-11-09 MED ORDER — LOSARTAN POTASSIUM-HCTZ 100-25 MG PO TABS: 1.0000 | ORAL_TABLET | Freq: Every day | ORAL | Status: DC

## 2019-11-09 MED ORDER — LIDOCAINE-EPINEPHRINE 1 %-1:100000 IJ SOLN
INTRAMUSCULAR | Status: DC | PRN
  Administered 2019-11-09: 5 mL
  Administered 2019-11-09: 4 mL

## 2019-11-09 MED ORDER — INSULIN GLARGINE 100 UNIT/ML ~~LOC~~ SOLN
18.0000 [IU] | Freq: Every day | SUBCUTANEOUS | Status: DC
  Administered 2019-11-10: 18 [IU] via SUBCUTANEOUS
  Filled 2019-11-09: qty 0.18

## 2019-11-09 MED ORDER — DULAGLUTIDE 3 MG/0.5ML ~~LOC~~ SOAJ: 3.0000 mg | SUBCUTANEOUS | Status: DC

## 2019-11-09 MED ORDER — PROPOFOL 10 MG/ML IV BOLUS
INTRAVENOUS | Status: AC
  Filled 2019-11-09: qty 20

## 2019-11-09 MED ORDER — OXYCODONE HCL 5 MG PO TABS
ORAL_TABLET | ORAL | Status: AC
  Filled 2019-11-09: qty 1

## 2019-11-09 MED ORDER — CHLORHEXIDINE GLUCONATE 0.12 % MT SOLN
15.0000 mL | Freq: Once | OROMUCOSAL | Status: AC
  Administered 2019-11-09: 15 mL via OROMUCOSAL
  Filled 2019-11-09: qty 15

## 2019-11-09 MED ORDER — BUPIVACAINE HCL (PF) 0.5 % IJ SOLN
INTRAMUSCULAR | Status: DC | PRN
  Administered 2019-11-09: 5 mL
  Administered 2019-11-09: 40 mL

## 2019-11-09 MED ORDER — ONDANSETRON HCL 4 MG/2ML IJ SOLN: 4.0000 mg | Freq: Four times a day (QID) | INTRAMUSCULAR | Status: DC | PRN

## 2019-11-09 MED ORDER — ONDANSETRON HCL 4 MG/2ML IJ SOLN
INTRAMUSCULAR | Status: DC | PRN
  Administered 2019-11-09: 4 mg via INTRAVENOUS

## 2019-11-09 MED ORDER — MORPHINE SULFATE (PF) 2 MG/ML IV SOLN
1.0000 mg | INTRAVENOUS | Status: DC | PRN
  Administered 2019-11-09 (×2): 2 mg via INTRAVENOUS
  Filled 2019-11-09 (×2): qty 1

## 2019-11-09 MED ORDER — ROCURONIUM BROMIDE 10 MG/ML (PF) SYRINGE
PREFILLED_SYRINGE | INTRAVENOUS | Status: AC
  Filled 2019-11-09: qty 10

## 2019-11-09 MED ORDER — FENTANYL CITRATE (PF) 100 MCG/2ML IJ SOLN
INTRAMUSCULAR | Status: AC
  Filled 2019-11-09: qty 2

## 2019-11-09 MED ORDER — FENTANYL CITRATE (PF) 100 MCG/2ML IJ SOLN
INTRAMUSCULAR | Status: DC | PRN
Start: 2019-11-09 — End: 2019-11-09
  Administered 2019-11-09 (×2): 50 ug via INTRAVENOUS
  Administered 2019-11-09: 100 ug via INTRAVENOUS
  Administered 2019-11-09: 50 ug via INTRAVENOUS

## 2019-11-09 MED ORDER — ALBUMIN HUMAN 5 % IV SOLN: INTRAVENOUS | Status: DC | PRN

## 2019-11-09 MED ORDER — HYDROCHLOROTHIAZIDE 25 MG PO TABS
25.0000 mg | ORAL_TABLET | Freq: Every day | ORAL | Status: DC
  Filled 2019-11-09: qty 1

## 2019-11-09 MED ORDER — INSULIN ASPART 100 UNIT/ML ~~LOC~~ SOLN
0.0000 [IU] | SUBCUTANEOUS | Status: DC
  Administered 2019-11-09: 11 [IU] via SUBCUTANEOUS

## 2019-11-09 MED ORDER — THROMBIN 20000 UNITS EX SOLR
CUTANEOUS | Status: AC
  Filled 2019-11-09: qty 20000

## 2019-11-09 MED ORDER — ROCURONIUM BROMIDE 10 MG/ML (PF) SYRINGE
PREFILLED_SYRINGE | INTRAVENOUS | Status: DC | PRN
  Administered 2019-11-09: 60 mg via INTRAVENOUS
  Administered 2019-11-09 (×3): 10 mg via INTRAVENOUS

## 2019-11-09 MED ORDER — PHENYLEPHRINE 40 MCG/ML (10ML) SYRINGE FOR IV PUSH (FOR BLOOD PRESSURE SUPPORT)
PREFILLED_SYRINGE | INTRAVENOUS | Status: AC
  Filled 2019-11-09: qty 10

## 2019-11-09 MED ORDER — OXYCODONE HCL 5 MG PO TABS
5.0000 mg | ORAL_TABLET | Freq: Once | ORAL | Status: AC | PRN
  Administered 2019-11-09: 5 mg via ORAL

## 2019-11-09 MED ORDER — SODIUM CHLORIDE 0.9 % IV SOLN
INTRAVENOUS | Status: DC | PRN
  Administered 2019-11-09: 500 mL

## 2019-11-09 MED ORDER — THROMBIN 20000 UNITS EX SOLR
CUTANEOUS | Status: DC | PRN
  Administered 2019-11-09: 20 mL via TOPICAL

## 2019-11-09 MED ORDER — MENTHOL 3 MG MT LOZG
1.0000 | LOZENGE | OROMUCOSAL | Status: DC | PRN
  Administered 2019-11-10: 3 mg via ORAL
  Filled 2019-11-09 (×2): qty 9

## 2019-11-09 MED ORDER — MIDAZOLAM HCL 2 MG/2ML IJ SOLN
INTRAMUSCULAR | Status: AC
  Filled 2019-11-09: qty 2

## 2019-11-09 MED ORDER — GLYCOPYRROLATE PF 0.2 MG/ML IJ SOSY
PREFILLED_SYRINGE | INTRAMUSCULAR | Status: AC
  Filled 2019-11-09: qty 1

## 2019-11-09 MED ORDER — LIDOCAINE 2% (20 MG/ML) 5 ML SYRINGE
INTRAMUSCULAR | Status: DC | PRN
  Administered 2019-11-09: 60 mg via INTRAVENOUS

## 2019-11-09 MED ORDER — KETOROLAC TROMETHAMINE 15 MG/ML IJ SOLN
7.5000 mg | Freq: Four times a day (QID) | INTRAMUSCULAR | Status: AC
  Administered 2019-11-09 – 2019-11-10 (×4): 7.5 mg via INTRAVENOUS
  Filled 2019-11-09 (×4): qty 1

## 2019-11-09 MED ORDER — KETOROLAC TROMETHAMINE 15 MG/ML IJ SOLN
INTRAMUSCULAR | Status: AC
  Filled 2019-11-09: qty 1

## 2019-11-09 MED ORDER — THROMBIN 5000 UNITS EX SOLR
OROMUCOSAL | Status: DC | PRN
  Administered 2019-11-09: 5 mL via TOPICAL

## 2019-11-09 MED ORDER — METHOCARBAMOL 500 MG PO TABS
ORAL_TABLET | ORAL | Status: AC
  Administered 2019-11-09: 500 mg
  Filled 2019-11-09: qty 1

## 2019-11-09 MED ORDER — VANCOMYCIN HCL IN DEXTROSE 1-5 GM/200ML-% IV SOLN
1000.0000 mg | INTRAVENOUS | Status: AC
  Administered 2019-11-09: 1000 mg via INTRAVENOUS
  Filled 2019-11-09: qty 200

## 2019-11-09 MED ORDER — PROPOFOL 10 MG/ML IV BOLUS
INTRAVENOUS | Status: DC | PRN
  Administered 2019-11-09: 160 mg via INTRAVENOUS

## 2019-11-09 MED ORDER — TAMSULOSIN HCL 0.4 MG PO CAPS
0.4000 mg | ORAL_CAPSULE | Freq: Every day | ORAL | Status: DC
  Administered 2019-11-10: 0.4 mg via ORAL
  Filled 2019-11-09: qty 1

## 2019-11-09 MED ORDER — OXYCODONE-ACETAMINOPHEN 5-325 MG PO TABS
1.0000 | ORAL_TABLET | ORAL | Status: DC | PRN
  Administered 2019-11-09: 2 via ORAL
  Administered 2019-11-09: 1 via ORAL
  Administered 2019-11-10 (×2): 2 via ORAL
  Filled 2019-11-09: qty 1
  Filled 2019-11-09 (×3): qty 2

## 2019-11-09 MED ORDER — CHLORHEXIDINE GLUCONATE CLOTH 2 % EX PADS: 6.0000 | MEDICATED_PAD | Freq: Once | CUTANEOUS | Status: DC

## 2019-11-09 MED ORDER — LOSARTAN POTASSIUM 50 MG PO TABS
100.0000 mg | ORAL_TABLET | Freq: Every day | ORAL | Status: DC
  Administered 2019-11-10: 100 mg via ORAL
  Filled 2019-11-09: qty 2

## 2019-11-09 MED ORDER — SODIUM CHLORIDE 0.9% FLUSH
3.0000 mL | Freq: Two times a day (BID) | INTRAVENOUS | Status: DC
  Administered 2019-11-09 (×2): 3 mL via INTRAVENOUS

## 2019-11-09 MED ORDER — EPHEDRINE SULFATE-NACL 50-0.9 MG/10ML-% IV SOSY
PREFILLED_SYRINGE | INTRAVENOUS | Status: DC | PRN
  Administered 2019-11-09 (×2): 5 mg via INTRAVENOUS

## 2019-11-09 MED ORDER — ONDANSETRON HCL 4 MG/2ML IJ SOLN
INTRAMUSCULAR | Status: AC
  Filled 2019-11-09: qty 2

## 2019-11-09 MED ORDER — ONDANSETRON HCL 4 MG PO TABS: 4.0000 mg | ORAL_TABLET | Freq: Four times a day (QID) | ORAL | Status: DC | PRN

## 2019-11-09 MED ORDER — THROMBIN 5000 UNITS EX SOLR
CUTANEOUS | Status: AC
  Filled 2019-11-09: qty 5000

## 2019-11-09 MED ORDER — METHOCARBAMOL 1000 MG/10ML IJ SOLN
500.0000 mg | Freq: Four times a day (QID) | INTRAVENOUS | Status: DC | PRN
  Filled 2019-11-09: qty 5

## 2019-11-09 MED ORDER — DEXAMETHASONE SODIUM PHOSPHATE 10 MG/ML IJ SOLN
INTRAMUSCULAR | Status: DC | PRN
  Administered 2019-11-09: 10 mg via INTRAVENOUS

## 2019-11-09 MED ORDER — FENTANYL CITRATE (PF) 250 MCG/5ML IJ SOLN
INTRAMUSCULAR | Status: AC
  Filled 2019-11-09: qty 5

## 2019-11-09 MED ORDER — METHOCARBAMOL 500 MG PO TABS
500.0000 mg | ORAL_TABLET | Freq: Four times a day (QID) | ORAL | Status: DC | PRN
  Administered 2019-11-09 – 2019-11-10 (×2): 500 mg via ORAL
  Filled 2019-11-09 (×2): qty 1

## 2019-11-09 MED ORDER — PHENYLEPHRINE 40 MCG/ML (10ML) SYRINGE FOR IV PUSH (FOR BLOOD PRESSURE SUPPORT)
PREFILLED_SYRINGE | INTRAVENOUS | Status: DC | PRN
  Administered 2019-11-09: 120 ug via INTRAVENOUS
  Administered 2019-11-09 (×3): 80 ug via INTRAVENOUS

## 2019-11-09 MED ORDER — INSULIN ASPART 100 UNIT/ML ~~LOC~~ SOLN
0.0000 [IU] | Freq: Three times a day (TID) | SUBCUTANEOUS | Status: DC
  Administered 2019-11-10: 7 [IU] via SUBCUTANEOUS

## 2019-11-09 MED ORDER — ORAL CARE MOUTH RINSE: 15.0000 mL | Freq: Once | OROMUCOSAL | Status: AC

## 2019-11-09 MED ORDER — LIDOCAINE 2% (20 MG/ML) 5 ML SYRINGE
INTRAMUSCULAR | Status: AC
  Filled 2019-11-09: qty 5

## 2019-11-09 MED ORDER — LACTATED RINGERS IV SOLN: INTRAVENOUS | Status: DC | PRN

## 2019-11-09 MED ORDER — OXYCODONE HCL 5 MG/5ML PO SOLN: 5.0000 mg | Freq: Once | ORAL | Status: AC | PRN

## 2019-11-09 MED ORDER — SODIUM CHLORIDE 0.9% FLUSH: 3.0000 mL | INTRAVENOUS | Status: DC | PRN

## 2019-11-09 MED ORDER — DEXAMETHASONE SODIUM PHOSPHATE 10 MG/ML IJ SOLN
INTRAMUSCULAR | Status: AC
  Filled 2019-11-09: qty 1

## 2019-11-09 MED ORDER — PHENYLEPHRINE HCL-NACL 10-0.9 MG/250ML-% IV SOLN
INTRAVENOUS | Status: DC | PRN
  Administered 2019-11-09: 50 ug/min via INTRAVENOUS

## 2019-11-09 SURGICAL SUPPLY — 74 items
ADH SKN CLS APL DERMABOND .7 (GAUZE/BANDAGES/DRESSINGS) ×1
APL SRG 60D 8 XTD TIP BNDBL (TIP)
BAG DECANTER FOR FLEXI CONT (MISCELLANEOUS) ×2 IMPLANT
BASKET BONE COLLECTION (BASKET) ×2 IMPLANT
BLADE CLIPPER SURG (BLADE) ×1 IMPLANT
BONE CANC CHIPS 40CC CAN1/2 (Bone Implant) ×2 IMPLANT
BUR MATCHSTICK NEURO 3.0 LAGG (BURR) ×2 IMPLANT
CAGE COROENT LRG 9X9X28-8 (Cage) ×2 IMPLANT
CANISTER SUCT 3000ML PPV (MISCELLANEOUS) ×2 IMPLANT
CHIPS CANC BONE 40CC CAN1/2 (Bone Implant) ×1 IMPLANT
CNTNR URN SCR LID CUP LEK RST (MISCELLANEOUS) ×1 IMPLANT
CONT SPEC 4OZ STRL OR WHT (MISCELLANEOUS) ×2
COVER BACK TABLE 60X90IN (DRAPES) ×2 IMPLANT
COVER WAND RF STERILE (DRAPES) ×2 IMPLANT
DECANTER SPIKE VIAL GLASS SM (MISCELLANEOUS) ×2 IMPLANT
DERMABOND ADVANCED (GAUZE/BANDAGES/DRESSINGS) ×1
DERMABOND ADVANCED .7 DNX12 (GAUZE/BANDAGES/DRESSINGS) ×1 IMPLANT
DEVICE DISSECT PLASMABLAD 3.0S (MISCELLANEOUS) ×1 IMPLANT
DRAPE C-ARM 42X72 X-RAY (DRAPES) ×4 IMPLANT
DRAPE HALF SHEET 40X57 (DRAPES) IMPLANT
DRAPE LAPAROTOMY 100X72X124 (DRAPES) ×2 IMPLANT
DURAPREP 26ML APPLICATOR (WOUND CARE) ×2 IMPLANT
DURASEAL APPLICATOR TIP (TIP) IMPLANT
DURASEAL SPINE SEALANT 3ML (MISCELLANEOUS) IMPLANT
ELECT REM PT RETURN 9FT ADLT (ELECTROSURGICAL) ×2
ELECTRODE REM PT RTRN 9FT ADLT (ELECTROSURGICAL) ×1 IMPLANT
GAUZE 4X4 16PLY RFD (DISPOSABLE) IMPLANT
GAUZE SPONGE 4X4 12PLY STRL (GAUZE/BANDAGES/DRESSINGS) ×2 IMPLANT
GLOVE BIO SURGEON STRL SZ 6.5 (GLOVE) ×2 IMPLANT
GLOVE BIOGEL PI IND STRL 6.5 (GLOVE) ×1 IMPLANT
GLOVE BIOGEL PI IND STRL 7.5 (GLOVE) IMPLANT
GLOVE BIOGEL PI IND STRL 8.5 (GLOVE) ×2 IMPLANT
GLOVE BIOGEL PI INDICATOR 6.5 (GLOVE) ×2
GLOVE BIOGEL PI INDICATOR 7.5 (GLOVE) ×1
GLOVE BIOGEL PI INDICATOR 8.5 (GLOVE) ×2
GLOVE ECLIPSE 8.5 STRL (GLOVE) ×4 IMPLANT
GLOVE SURG SS PI 6.5 STRL IVOR (GLOVE) ×1 IMPLANT
GOWN STRL REUS W/ TWL LRG LVL3 (GOWN DISPOSABLE) IMPLANT
GOWN STRL REUS W/ TWL XL LVL3 (GOWN DISPOSABLE) IMPLANT
GOWN STRL REUS W/TWL 2XL LVL3 (GOWN DISPOSABLE) ×4 IMPLANT
GOWN STRL REUS W/TWL LRG LVL3 (GOWN DISPOSABLE) ×2
GOWN STRL REUS W/TWL XL LVL3 (GOWN DISPOSABLE) ×4
GRAFT BNE CHIP CANC 1-8 40 (Bone Implant) IMPLANT
GUIDEWIRE NITINOL BEVEL TIP (WIRE) ×2 IMPLANT
HEMOSTAT POWDER KIT SURGIFOAM (HEMOSTASIS) ×1 IMPLANT
KIT BASIN OR (CUSTOM PROCEDURE TRAY) ×2 IMPLANT
KIT INFUSE SMALL (Orthopedic Implant) ×1 IMPLANT
KIT TURNOVER KIT B (KITS) ×2 IMPLANT
MILL MEDIUM DISP (BLADE) ×2 IMPLANT
NDL I-PASS III (NEEDLE) IMPLANT
NEEDLE HYPO 22GX1.5 SAFETY (NEEDLE) ×2 IMPLANT
NEEDLE I-PASS III (NEEDLE) ×2 IMPLANT
NS IRRIG 1000ML POUR BTL (IV SOLUTION) ×2 IMPLANT
PACK LAMINECTOMY NEURO (CUSTOM PROCEDURE TRAY) ×2 IMPLANT
PAD ARMBOARD 7.5X6 YLW CONV (MISCELLANEOUS) ×3 IMPLANT
PATTIES SURGICAL .5 X1 (DISPOSABLE) ×2 IMPLANT
PLASMABLADE 3.0S (MISCELLANEOUS) ×2
ROD RELINE LORDOTIC 5.5X45 (Rod) ×2 IMPLANT
SCREW LOCK RELINE 5.5 TULIP (Screw) ×4 IMPLANT
SCREW MAS RELINE 6.5X45 POLY (Screw) ×2 IMPLANT
SCREW RELINE-O POLY 6.5X45 (Screw) ×2 IMPLANT
SPONGE LAP 4X18 RFD (DISPOSABLE) IMPLANT
SPONGE SURGIFOAM ABS GEL 100 (HEMOSTASIS) ×2 IMPLANT
SUT PROLENE 6 0 BV (SUTURE) IMPLANT
SUT VIC AB 1 CT1 18XBRD ANBCTR (SUTURE) ×1 IMPLANT
SUT VIC AB 1 CT1 8-18 (SUTURE) ×2
SUT VIC AB 2-0 CP2 18 (SUTURE) ×2 IMPLANT
SUT VIC AB 3-0 SH 8-18 (SUTURE) ×2 IMPLANT
SUT VIC AB 4-0 RB1 18 (SUTURE) ×2 IMPLANT
SYR 3ML LL SCALE MARK (SYRINGE) ×8 IMPLANT
TOWEL GREEN STERILE (TOWEL DISPOSABLE) ×2 IMPLANT
TOWEL GREEN STERILE FF (TOWEL DISPOSABLE) ×2 IMPLANT
TRAY FOLEY MTR SLVR 16FR STAT (SET/KITS/TRAYS/PACK) ×2 IMPLANT
WATER STERILE IRR 1000ML POUR (IV SOLUTION) ×2 IMPLANT

## 2019-11-09 NOTE — Progress Notes (Addendum)
Inpatient Diabetes Program Recommendations  AACE/ADA: New Consensus Statement on Inpatient Glycemic Control (2015)  Target Ranges:  Prepandial:   less than 140 mg/dL      Peak postprandial:   less than 180 mg/dL (1-2 hours)      Critically ill patients:  140 - 180 mg/dL   Lab Results  Component Value Date   GLUCAP 216 (H) 11/09/2019   HGBA1C 8.5 (A) 09/16/2019    Review of Glycemic Control Results for Francis Cross, Francis Cross (MRN [id number]) as of 11/09/2019 16:05  Ref. Range 11/09/2019 09:11 11/09/2019 10:52 11/09/2019 13:39  Glucose-Capillary Latest Ref Range: 70 - 99 mg/dL 154 (H) 160 (H) 216 (H)   Diabetes history: DM 2 Outpatient Diabetes medications: Trulicity 0.5 mg weekly, Lantus 20 units Current orders for Inpatient glycemic control:  Lantus 20 units Novolog 0-20 units tid   Decadron 10 mg given during surgery  Consult Pt with Diabetes and surgery  Inpatient Diabetes Program Recommendations:    -   Consider changing Novolog Correction to Q4 hours overnight for extra coverage in the setting of Decadron 10 mg given during surgery.  Discussed with Dr. Ellene Route, orders received and changed.  Thanks,  Tama Headings RN, MSN, BC-ADM Inpatient Diabetes Coordinator Team Pager [phone] (8a-5p)

## 2019-11-09 NOTE — Progress Notes (Signed)
Pt was transferred to 3W34 via wheelchair accompanied by daughter with all his belongings; in no acute pain nor discomfort verbalized. Report given to receiving RN Barnett Applebaum.

## 2019-11-09 NOTE — Transfer of Care (Signed)
Immediate Anesthesia Transfer of Care Note  Patient: Francis Cross  Procedure(s) Performed: Lumbar four-five Posterior lumbar interbody fusion (N/A Back)  Patient Location: PACU  Anesthesia Type:General  Level of Consciousness: awake and patient cooperative  Airway & Oxygen Therapy: Patient Spontanous Breathing and Patient connected to face mask oxygen  Post-op Assessment: Report given to RN, Post -op Vital signs reviewed and stable and Patient moving all extremities X 4  Post vital signs: Reviewed and stable  Last Vitals:  Vitals Value Taken Time  BP 124/88 11/09/19 1337  Temp    Pulse 102 11/09/19 1341  Resp 15 11/09/19 1341  SpO2 99 % 11/09/19 1341  Vitals shown include unvalidated device data.  Last Pain:  Vitals:   11/09/19 0621  TempSrc:   PainSc: 0-No pain         Complications: No complications documented.

## 2019-11-09 NOTE — H&P (Signed)
Francis Cross is an 64 y.o. male.   Chief Complaint: Back and bilateral lower extremity pain HPI: Francis Cross is a 64 year old individual whose had previous herniated nucleus pulposus at the level of L4-L5 on the left side.  He had a persistent left lumbar radiculopathy and this gradually similar down.  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  He did have a transient response to an intradiscal steroid injection done a number of months ago but the pain started to recur.  He has an underlying history of diabetes and we have advised against repeated injections.  He is now being admitted to undergo surgical decompression and stabilization of the L4-L5 joint.  Past Medical History:  Diagnosis Date  . Acid reflux   . Allergy   . Arthritis    hands  . Cancer ([place])    skin cancer- basal cell on nose  . Cataract   . Chronic kidney disease    Kidney stones  . Diabetes mellitus without complication ([place])   . Difficult intubation   . Erectile dysfunction   . Fatty liver   . Gout   . Hemochromatosis   . Hiatal hernia   . History of kidney stones   . Hyperlipidemia   . Hypertension   . Iron overload   . Obesity   . Sciatica    bilateral, intermittent    Past Surgical History:  Procedure Laterality Date  . APPENDECTOMY    . BACK SURGERY  09/09/2017   lumbar discectomy   . CARDIAC CATHETERIZATION     [facility]  . CATARACT EXTRACTION W/PHACO Right 12/15/2018   Procedure: CATARACT EXTRACTION PHACO AND INTRAOCULAR LENS PLACEMENT ([facility]) RIGHT DIABETIC 00:35.7  16.3%  5.81;  Surgeon: Birder Robson, MD;  Location: [facility];  Service: Ophthalmology;  Laterality: Right;  diabetic - insulin  . CATARACT EXTRACTION W/PHACO Left 01/05/2019   Procedure: CATARACT EXTRACTION PHACO AND INTRAOCULAR LENS  PLACEMENT ([place]) LEFT DIABETIC;  Surgeon: Birder Robson, MD;  Location: [facility];  Service: Ophthalmology;  Laterality: Left;  0:31 16.4% 5.20  . CERVICAL FUSION    . COLONOSCOPY WITH PROPOFOL N/A 12/22/2018   Procedure: COLONOSCOPY WITH PROPOFOL;  Surgeon: Doran Stabler, MD;  Location: [facility];  Service: Gastroenterology;  Laterality: N/A;  . KIDNEY STONE SURGERY  03/07/2017   laser blast  . LUMBAR LAMINECTOMY/DECOMPRESSION MICRODISCECTOMY N/A 09/23/2017   Procedure: Lumbar Four-Five Redo Discectomy for epidural hematoma.;  Surgeon: Kristeen Miss, MD;  Location: [facility];  Service: Neurosurgery;  Laterality: N/A;  . TONSILLECTOMY      Family History  Problem Relation Age of Onset  . Lung cancer Father   . Melanoma Mother   . Hyperlipidemia Sister   . Hypertension Sister   . Colon cancer Neg Hx   . Stomach cancer Neg Hx   . Rectal cancer Neg Hx   . Esophageal cancer Neg Hx   . Liver cancer Neg Hx    Social History:  reports that he has quit smoking. He has quit using smokeless tobacco. He reports previous alcohol use. He reports that he does not use drugs.  Allergies:  Allergies  Allergen Reactions  . Keflex [Cephalexin] Rash  . Penicillins Itching, Rash and Other (See Comments)    Has patient had a PCN reaction causing  immediate rash, facial/tongue/throat swelling, SOB or lightheadedness with hypotension: No Has patient had a PCN reaction causing severe rash involving mucus membranes or skin necrosis: No Has patient had a PCN reaction that required hospitalization: No Has patient had a PCN reaction occurring within the last 10 years: No If all of the above answers are "NO", then may proceed with Cephalosporin use.     Medications Prior to Admission  Medication Sig Dispense Refill  . Alirocumab (PRALUENT) 150 MG/ML SOAJ Inject 150 mg into the skin every 14 (fourteen) days. 2 pen 11  . Dulaglutide (TRULICITY) 3 WC/5.8NI SOPN Inject 0.5 mLs (3 mg total) as  directed once a week. 6 mL 1  . ibuprofen (ADVIL) 800 MG tablet Take 800 mg by mouth 3 (three) times daily as needed for moderate pain.     . Insulin Glargine (BASAGLAR KWIKPEN) 100 UNIT/ML Inject 0.18 mLs (18 Units total) into the skin daily. (Patient taking differently: Inject 20 Units into the skin daily. ) 15 pen 1  . losartan-hydrochlorothiazide (HYZAAR) 100-25 MG tablet Take 1 tablet by mouth daily. 90 tablet 1  . omeprazole (PRILOSEC) 40 MG capsule Take 40 mg by mouth daily.    . tamsulosin (FLOMAX) 0.4 MG CAPS capsule Take 0.4 mg by mouth daily.     Marland Kitchen acetaminophen (TYLENOL) 325 MG tablet Take 650 mg by mouth every 6 (six) hours as needed for mild pain or moderate pain.     . BD PEN NEEDLE NANO 2ND GEN 32G X 4 MM MISC FOR USE WITH BASAGLAR PEN 100 each 3  . clotrimazole (LOTRIMIN) 1 % cream Apply 1 application topically 2 (two) times daily. As needed to  affected area. 30 g 1  . ketoconazole (NIZORAL) 2 % shampoo APPLY TOPICALLY 2 (TWO) TIMES A WEEK. 120 mL 1  . ketorolac (ACULAR) 0.5 % ophthalmic solution Place 1 drop into both eyes 2 (two) times daily as needed. 5 mL 1  . omeprazole (PRILOSEC) 20 MG capsule Take 1 capsule (20 mg total) by mouth daily. 20 capsule 0    Results for orders placed or performed during the hospital encounter of 11/09/19 (from the past 48 hour(s))  Glucose, capillary     Status: Abnormal   Collection Time: 11/09/19  6:01 AM  Result Value Ref Range   Glucose-Capillary 120 (H) 70 - 99 mg/dL    Comment: Glucose reference range applies only to samples taken after fasting for at least 8 hours.  ABO/Rh     Status: None   Collection Time: 11/09/19  6:24 AM  Result Value Ref Range   ABO/RH(D)      A POS Performed at [facility] [address] [place], [place] [address]   Glucose, capillary     Status: Abnormal   Collection Time: 11/09/19  9:11 AM  Result Value Ref Range   Glucose-Capillary 154 (H) 70 - 99 mg/dL    Comment: Glucose reference range  applies only to samples taken after fasting for at least 8 hours.   No results found.  Review of Systems  Constitutional: Negative.   HENT: Negative.   Eyes: Negative.   Respiratory: Negative.   Cardiovascular: Negative.   Gastrointestinal: Negative.   Endocrine: Negative.   Genitourinary: Negative.   Musculoskeletal: Positive for back pain.  Allergic/Immunologic: Negative.   Neurological: Positive for weakness and numbness.  Hematological: Negative.   Psychiatric/Behavioral: Negative.     Blood pressure (!) 143/74, pulse 65, temperature 97.7 F (36.5 C), temperature  source Temporal, resp. rate 20, height 5\' 5"  (1.651 m), weight 93 kg, SpO2 98 %. Physical Exam Constitutional:      Appearance: Normal appearance.  HENT:     Head: Normocephalic and atraumatic.     Nose: Nose normal.     Mouth/Throat:     Mouth: Mucous membranes are moist.  Eyes:     Extraocular Movements: Extraocular movements intact.     Pupils: Pupils are equal, round, and reactive to light.  Cardiovascular:     Rate and Rhythm: Regular rhythm.     Pulses: Normal pulses.     Heart sounds: Normal heart sounds.  Pulmonary:     Breath sounds: Normal breath sounds.  Abdominal:     General: Abdomen is flat.     Palpations: Abdomen is soft.  Musculoskeletal:     Cervical back: Normal range of motion and neck supple.     Comments: Positive straight leg raising both lower extremities 30 degrees Patrick's maneuver is negative bilaterally.  Tender to percussion in the lower lumbar spine at the lumbosacral junction.  Flexion forward is limited to 60 degrees.  Extension is normal.  Skin:    General: Skin is warm and dry.     Capillary Refill: Capillary refill takes less than 2 seconds.  Neurological:     Mental Status: He is alert.     Comments: Cranial nerve examination is normal.  Upper extremity strength is normal in the deltoids biceps triceps grips and intrinsics deep tendon reflexes are 1+ in the biceps and  triceps.  Lower extremities tibialis anterior on the right is weak to 4 out of 5 on the left is 4- out of 5 deep tendon reflexes are present in the patellae 2+ Achilles reflexes absent on the right 1+ on the left.  Psychiatric:        Mood and Affect: Mood normal.        Behavior: Behavior normal.      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  Lumbar radiculopathy.  Plan: Posterior decompression interbody arthrodesis L4-L5 pedicle screw fixation.  Earleen Newport, MD 11/09/2019, 9:22 AM

## 2019-11-09 NOTE — Anesthesia Procedure Notes (Signed)
Procedure Name: Intubation Date/Time: 11/09/2019 9:38 AM Performed by: Orlie Dakin, CRNA Pre-anesthesia Checklist: Emergency Drugs available, Suction available, Patient identified and Patient being monitored Patient Re-evaluated:Patient Re-evaluated prior to induction Oxygen Delivery Method: Circle system utilized Preoxygenation: Pre-oxygenation with 100% oxygen Induction Type: IV induction Ventilation: Mask ventilation without difficulty Laryngoscope Size: Glidescope and 4 Grade View: Grade I Tube type: Oral Tube size: 7.5 mm Number of attempts: 1 Airway Equipment and Method: Stylet and Video-laryngoscopy Placement Confirmation: ETT inserted through vocal cords under direct vision,  positive ETCO2 and breath sounds checked- equal and bilateral Secured at: 23 cm Tube secured with: Tape Dental Injury: Teeth and Oropharynx as per pre-operative assessment  Difficulty Due To: Difficulty was anticipated, Difficult Airway- due to anterior larynx, Difficult Airway- due to large tongue and Difficult Airway- due to reduced neck mobility Comments: Noted previous Glidescope used for intubation.  Intubation as noted above.  4x4s bite block used at end of proc.

## 2019-11-09 NOTE — Op Note (Signed)
Date of surgery: 11/09/2019 Preoperative diagnosis: Recurrent herniated nucleus pulposus L4-L5 with lumbar radiculopathy, spondylolisthesis L4-L5. Postoperative diagnosis: Same Procedure: Bilateral laminotomies decompression of L4 and L5 nerve roots with more work than required for simple interbody technique.  Posterior lumbar interbody arthrodesis using peek spacers local autograft allograft and infuse.  Posterior lateral arthrodesis with local autograft allograft and infuse.  Pedicle screw fixation L4-L5.  Fluoroscopic visualization. Surgeon: Kristeen Miss First Assistant: Osie Cheeks nurse practitioner. Anesthesia: General endotracheal Indications: Francis Cross is a 64 year old individual who several years ago her native nucleus pulposus at L4-L5.  Underwent surgical decompression.  He had chronic radicular pain afterwards.  He did improve but overall his function had been limited.  He had recurred pain in the last number of months and a repeat MRI demonstrates presence of a recurrent disc herniation with severe degenerative changes that have occurred in the interval.  Patient also has evidence of a spondylolisthesis that is forming.  Because of this he was advised regarding surgery not only to decompress the spinal canal but also deforming arthrodesis at the level of L4 and L5.  Procedure: The patient was brought to the operating room supine on the stretcher.  After the smooth induction of general endotracheal anesthesia, he was carefully turned prone.  The back was prepped with alcohol DuraPrep and draped in a sterile fashion.  A midline incision was created through his previous scar and dissection was carried down to the lumbodorsal fascia.  Fascia was opened on either side of midline and a localizing radiograph identified the spinous processes of L4 and L5.  Then by performing a subperiosteal dissection of L4-L5 the dissection was carried out over the facet joints and self-retaining retractors were  placed.  Lamina of L4 out to the facet joints between L4 and L5 was cleared.  The pars was identified on either side and the transverse process of L4 was cleared and packed off for later grafting for an arthrodesis between L4 and L5 and the transverse processes transverse process of L5 was cleared similarly.  Laminectomy was then created removing the inferior margin lamina of L4 out to and including the entirety of the facet at L4-5.  The bone was used for  autograft the L ligament was cleared on either side and on the left side particularly there is noted to be substantial thickening of the ligament with a considerable amount of epidural fibrosis.  In the lateral recess there is no to be significant portions of disc material free in the epidural space compressing the path the L5 nerve root.  The dissection was carefully performed to release the epidural fibrosis and mobilized the nerve medially.  The disc space was then entered and cleared of a substantial quantity of severely degenerated desiccated disc material.  Then by distracting the left side we worked on the right side and a similar process was carried out.  The disc space was entered and a substantial quantity of moderately degenerated disc material was encountered.  This was taken down carefully using a combination of curettes and rongeurs until a total discectomy was performed.  The endplates were cleared and rongeured with a toothed curette to remove all remnants of articular cartilage.  Once this was thoroughly completed both on the left and the right the interspace was sized for an appropriate size spacer.  It was felt that a 9 mm tall spacer 28 mm in length with 12 of lordosis would fit best into this interval.  To the spacers were  filled with autograft allograft and infuse and a total of 15 cc of bone graft was packed into the interspace at the level of L4-L5.  Once this was completely filled with bone and the 2 spacers attention was turned to  pedicle entry sites.  Pedicle entry sites were chosen at L4 and L5 but prior to doing this the intertransverse space was packed with a total of 9 cc of bone graft in either lateral gutter.  Pedicle entry sites were then chosen under fluoroscopic visualization and L4 a Jamshidi needle was used to facilitate placement of the pedicle screws widely because of the patient's muscular retraction.  6.5 x 45 mm pedicle screws were placed in L4 and 6.5 x 45 mm screws were placed in L5.  Once the screws were placed 45 mm precontoured rods were used to connect the screws in a neutral construct.  Final radiographs were obtained in AP and lateral projection identifying good maintenance of lordosis and good alignment in the coronal and sagittal planes.  With this all the retractors had been removed the wound was checked carefully for hemostasis a total of 30 cc of half percent Marcaine was injected into the paraspinous muscle and fascia and then the lumbodorsal fascia was closed with #1 Vicryl interrupted fashion 2-0 Vicryl was used in the subcutaneous tissues 3-0 Vicryl subcuticularly with 4-0 Vicryl also subcuticularly.  Dermabond was placed on the skin.  Blood loss for the procedure was estimated 300 cc.

## 2019-11-09 NOTE — Progress Notes (Signed)
Orthopedic Tech Progress Note Patient Details:  [name] 1955-08-15 [id number] Called in order to [facility] for an [place]. Patient ID: Francis Cross, male   DOB: 1955/11/13, 64 y.o.   MRN: [id number]   Janit Pagan 11/09/2019, 2:45 PM

## 2019-11-10 LAB — BASIC METABOLIC PANEL
Anion gap: 7 (ref 5–15)
BUN: 15 mg/dL (ref 8–23)
CO2: 28 mmol/L (ref 22–32)
Calcium: 9.2 mg/dL (ref 8.9–10.3)
Chloride: 102 mmol/L (ref 98–111)
Creatinine, Ser: 1.1 mg/dL (ref 0.61–1.24)
GFR calc Af Amer: >60 mL/min (ref >60)
GFR calc non Af Amer: >60 mL/min (ref >60)
Glucose, Bld: 189 mg/dL — ABNORMAL HIGH (ref 70–99)
Potassium: 4 mmol/L (ref 3.5–5.1)
Sodium: 137 mmol/L (ref 135–145)

## 2019-11-10 LAB — CBC
HCT: 38.3 % — ABNORMAL LOW (ref 39.0–52.0)
Hemoglobin: 12.6 g/dL — ABNORMAL LOW (ref 13.0–17.0)
MCH: 31.3 pg (ref 26.0–34.0)
MCHC: 32.9 g/dL (ref 30.0–36.0)
MCV: 95 fL (ref 80.0–100.0)
Platelets: 132 10*3/uL — ABNORMAL LOW (ref 150–400)
RBC: 4.03 MIL/uL — ABNORMAL LOW (ref 4.22–5.81)
RDW: 12.5 % (ref 11.5–15.5)
WBC: 14.2 10*3/uL — ABNORMAL HIGH (ref 4.0–10.5)
nRBC: 0 % (ref 0.0–0.2)

## 2019-11-10 LAB — GLUCOSE, CAPILLARY: Glucose-Capillary: 234 mg/dL — ABNORMAL HIGH (ref 70–99)

## 2019-11-10 MED ORDER — HYDROMORPHONE HCL 2 MG PO TABS: 2.0000 mg | ORAL_TABLET | ORAL | 0 refills | Status: DC

## 2019-11-10 MED ORDER — METHOCARBAMOL 500 MG PO TABS: 500.0000 mg | ORAL_TABLET | Freq: Four times a day (QID) | ORAL | 2 refills | Status: DC | PRN

## 2019-11-10 NOTE — Discharge Instructions (Signed)
Slowly increase your activity back to normal. No bending, no heavy lifting more than 10 pounds. Ambulate daily. Walk straight, sit straight and stand straight. No driving for a week. ° °Your incision is closed with dermabond (purple glue). This will naturally fall off over the next 1-2 weeks.  °Okay to shower for today. Use regular soap and water and try to be gentle when cleaning your incision. Pat the incision dry after shower. ° °Follow up with Dr. Elsner in 2 weeks after discharge. If you do not already have a discharge appointment, please call his office at [phone] to schedule a follow up appointment. If you have any concerns or questions, please call the office and let us know. °

## 2019-11-10 NOTE — TOC Transition Note (Addendum)
Transition of Care [facility]) - CM/SW Discharge Note   Patient Details  Name: Francis Cross MRN: [id number] Date of Birth: 1955-08-30  Transition of [facility]) CM/SW Contact:  Pollie Friar, RN Phone Number: 11/10/2019, 10:29 AM   Clinical Narrative:    Pt discharging home with self care. No needs per PT.  Pt has supervision at home per his spouse.  Pt has transportation home.    Final next level of care: Home/Self Care Barriers to Discharge: No Barriers Identified   Patient Goals and CMS Choice        Discharge Placement                       Discharge Plan and Services                                     Social Determinants of Health (SDOH) Interventions     Readmission Risk Interventions No flowsheet data found.

## 2019-11-10 NOTE — Discharge Summary (Signed)
Physician Discharge Summary  Patient ID: Francis Cross MRN: [id number] DOB/AGE: 10/18/1955 64 y.o.  Admit date: 11/09/2019 Discharge date: 11/10/2019  Admission Diagnoses: Recurrent herniated nucleus pulposus L4-L5 with lumbar radiculopathy, spondylolisthesis L4-L5.  Discharge Diagnoses:  Active Problems:   Spondylisthesis Recurrent herniated nucleus pulposus L4-L5 with lumbar radiculopathy, spondylolisthesis L4-L5.  Discharged Condition: good  Hospital Course: stable/uncomplicated  Consults: None  Significant Diagnostic Studies: n/a  Treatments: Bilateral laminotomies decompression of L4 and L5 nerve roots with more work than required for simple interbody technique.  Posterior lumbar interbody arthrodesis using peek spacers local autograft allograft and infuse.  Posterior lateral arthrodesis with local autograft allograft and infuse.  Pedicle screw fixation L4-L5.  Fluoroscopic visualization.  Discharge Exam: Blood pressure 119/67, pulse 74, temperature (!) 97.5 F (36.4 C), temperature source Oral, resp. rate 18, height 5\' 5"  (1.651 m), weight 93 kg, SpO2 98 %. Alert and oriented x 4 PERRLA CN II-XII grossly intact MAE, Strength and sensation intact Incision is covered with Honeycomb dressing; Dressing is clean, dry, and intact Pt is ambulating around the room and the hallway with Aspen Lumbar brace. VSS, NAD, ready for discharge  Disposition: Discharge disposition: 01-Home or Self Care       Discharge Instructions    Incentive spirometry RT   Complete by: As directed      Allergies as of 11/10/2019      Reactions   Keflex [cephalexin] Rash   Penicillins Itching, Rash, Other (See Comments)   Has patient had a PCN reaction causing immediate rash, facial/tongue/throat swelling, SOB or lightheadedness with hypotension: No Has patient had a PCN reaction causing severe rash involving mucus membranes or skin necrosis: No Has patient had a PCN reaction that required  hospitalization: No Has patient had a PCN reaction occurring within the last 10 years: No If all of the above answers are "NO", then may proceed with Cephalosporin use.      Medication List    STOP taking these medications   ibuprofen 800 MG tablet Commonly known as: ADVIL     TAKE these medications   acetaminophen 325 MG tablet Commonly known as: TYLENOL Take 650 mg by mouth every 6 (six) hours as needed for mild pain or moderate pain.   Basaglar KwikPen 100 UNIT/ML Inject 0.18 mLs (18 Units total) into the skin daily. What changed: how much to take   BD Pen Needle Nano 2nd Gen 32G X 4 MM Misc Generic drug: Insulin Pen Needle FOR USE WITH BASAGLAR PEN   clotrimazole 1 % cream Commonly known as: LOTRIMIN Apply 1 application topically 2 (two) times daily. As needed to  affected area.   HYDROmorphone 2 MG tablet Commonly known as: Dilaudid Take 1 tablet (2 mg total) by mouth every 3 (three) hours. Take 1-2 tablets by mouth every 3 hours as needed for pain   ketoconazole 2 % shampoo Commonly known as: NIZORAL APPLY TOPICALLY 2 (TWO) TIMES A WEEK.   ketorolac 0.5 % ophthalmic solution Commonly known as: Acular Place 1 drop into both eyes 2 (two) times daily as needed.   losartan-hydrochlorothiazide 100-25 MG tablet Commonly known as: Hyzaar Take 1 tablet by mouth daily.   methocarbamol 500 MG tablet Commonly known as: Robaxin Take 1 tablet (500 mg total) by mouth every 6 (six) hours as needed for muscle spasms.   omeprazole 20 MG capsule Commonly known as: PRILOSEC Take 1 capsule (20 mg total) by mouth daily.   omeprazole 40 MG capsule Commonly known as: PRILOSEC Take 40  mg by mouth daily.   Praluent 150 MG/ML Soaj Generic drug: Alirocumab Inject 150 mg into the skin every 14 (fourteen) days.   tamsulosin 0.4 MG Caps capsule Commonly known as: FLOMAX Take 0.4 mg by mouth daily.   Trulicity 3 VK/1.8MC Sopn Generic drug: Dulaglutide Inject 0.5 mLs (3 mg  total) as directed once a week.        Signed: Osie Cheeks, NP 11/10/2019, 9:22 AM

## 2019-11-10 NOTE — Anesthesia Postprocedure Evaluation (Signed)
Anesthesia Post Note  Patient: Francis Cross  Procedure(s) Performed: Lumbar four-five Posterior lumbar interbody fusion (N/A Back)     Patient location during evaluation: PACU Anesthesia Type: General Level of consciousness: awake and alert Pain management: pain level controlled Vital Signs Assessment: post-procedure vital signs reviewed and stable Respiratory status: spontaneous breathing, nonlabored ventilation, respiratory function stable and patient connected to nasal cannula oxygen Cardiovascular status: blood pressure returned to baseline and stable Postop Assessment: no apparent nausea or vomiting Anesthetic complications: no   No complications documented.  Last Vitals:  Vitals:   11/10/19 0302 11/10/19 0758  BP: 103/73 119/67  Pulse: 71 74  Resp: 17 18  Temp: 36.8 C (!) 36.4 C  SpO2: 98% 98%    Last Pain:  Vitals:   11/10/19 0800  TempSrc:   PainSc: 0-No pain                 Ari Engelbrecht S

## 2019-11-10 NOTE — Progress Notes (Signed)
NURSING PROGRESS NOTE  Francis Cross [id number] Discharge Data: 11/10/2019 11:43 AM Attending Provider: Kristeen Miss, MD XTG:GYIRSW, Ranell Patrick, MD     Francis Cross to be D/C'd to his private residence per MD order.  Discussed with the patient the After Visit Summary and all questions fully answered. All IV's discontinued with no bleeding noted. All belongings returned to patient for patient to take home.   Last Vital Signs:  Blood pressure 119/67, pulse 74, temperature (!) 97.5 F (36.4 C), temperature source Oral, resp. rate 18, height 5\' 5"  (1.651 m), weight 93 kg, SpO2 98 %.  Discharge Medication List Allergies as of 11/10/2019      Reactions   Keflex [cephalexin] Rash   Penicillins Itching, Rash, Other (See Comments)   Has patient had a PCN reaction causing immediate rash, facial/tongue/throat swelling, SOB or lightheadedness with hypotension: No Has patient had a PCN reaction causing severe rash involving mucus membranes or skin necrosis: No Has patient had a PCN reaction that required hospitalization: No Has patient had a PCN reaction occurring within the last 10 years: No If all of the above answers are "NO", then may proceed with Cephalosporin use.      Medication List    STOP taking these medications   ibuprofen 800 MG tablet Commonly known as: ADVIL     TAKE these medications   acetaminophen 325 MG tablet Commonly known as: TYLENOL Take 650 mg by mouth every 6 (six) hours as needed for mild pain or moderate pain.   Basaglar KwikPen 100 UNIT/ML Inject 0.18 mLs (18 Units total) into the skin daily. What changed: how much to take   BD Pen Needle Nano 2nd Gen 32G X 4 MM Misc Generic drug: Insulin Pen Needle FOR USE WITH BASAGLAR PEN   clotrimazole 1 % cream Commonly known as: LOTRIMIN Apply 1 application topically 2 (two) times daily. As needed to  affected area.   HYDROmorphone 2 MG tablet Commonly known as: Dilaudid Take 1 tablet (2 mg total) by mouth every 3  (three) hours. Take 1-2 tablets by mouth every 3 hours as needed for pain   ketoconazole 2 % shampoo Commonly known as: NIZORAL APPLY TOPICALLY 2 (TWO) TIMES A WEEK.   ketorolac 0.5 % ophthalmic solution Commonly known as: Acular Place 1 drop into both eyes 2 (two) times daily as needed.   losartan-hydrochlorothiazide 100-25 MG tablet Commonly known as: Hyzaar Take 1 tablet by mouth daily.   methocarbamol 500 MG tablet Commonly known as: Robaxin Take 1 tablet (500 mg total) by mouth every 6 (six) hours as needed for muscle spasms.   omeprazole 20 MG capsule Commonly known as: PRILOSEC Take 1 capsule (20 mg total) by mouth daily.   omeprazole 40 MG capsule Commonly known as: PRILOSEC Take 40 mg by mouth daily.   Praluent 150 MG/ML Soaj Generic drug: Alirocumab Inject 150 mg into the skin every 14 (fourteen) days.   tamsulosin 0.4 MG Caps capsule Commonly known as: FLOMAX Take 0.4 mg by mouth daily.   Trulicity 3 NI/6.2VO Sopn Generic drug: Dulaglutide Inject 0.5 mLs (3 mg total) as directed once a week.

## 2019-11-10 NOTE — Progress Notes (Signed)
Occupational Therapy Evaluation Patient Details Name: Francis Cross MRN: [id number] DOB: 23-Aug-1955 Today's Date: 11/10/2019    History of Present Illness 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  He had a persistent left lumbar radiculopathy and this gradually similar down.  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  S/P decompression L4-5   Clinical Impression   PTA patient independent. Admitted for above and limited by problem list below, including decreased functional reach/ROM with R LE, back precautions. Patient educated on back precautions, ADL compensatory techniques, recommendations, safety, and DME/AE.  He requires min assist for LB ADLs (able to figure 4 with L LE but not R LE), supervision to modified independent for transfers.  Min cueing for donning brace without twisting. He will have support at home as needed. Patient/ daughter with no further questions or concerns today.  No further OT needs identified, OT will sign off.     Follow Up Recommendations  No OT follow up;Supervision - Intermittent    Equipment Recommendations  3 in 1 bedside commode    Recommendations for Other Services       Precautions / Restrictions Precautions Precautions: Back Precaution Booklet Issued: Yes (comment) Precaution Comments: reviewed with patinet  Required Braces or Orthoses: Spinal Brace Spinal Brace: Applied in sitting position;Lumbar corset Restrictions Weight Bearing Restrictions: No      Mobility Bed Mobility Overal bed mobility: Modified Independent             General bed mobility comments: OOB upon entry   Transfers Overall transfer level: Needs assistance   Transfers: Sit to/from  Stand Sit to Stand: Modified independent (Device/Increase time);Supervision         General transfer comment: supervision for ADL transfers, overall basic transfers modified independent    Balance Overall balance assessment: Modified Independent                                         ADL either performed or assessed with clinical judgement   ADL Overall ADL's : Needs assistance/impaired     Grooming: Supervision/safety;Standing   Upper Body Bathing: Set up;Sitting   Lower Body Bathing: Minimal assistance;Sit to/from stand Lower Body Bathing Details (indicate cue type and reason): assist with R LE, supervision sit to stand  Upper Body Dressing : Sitting;Supervision/safety Upper Body Dressing Details (indicate cue type and reason): min cueing for brace mgmt and donning without twisting  Lower Body Dressing: Minimal assistance;Sit to/from stand;Cueing for compensatory techniques;Cueing for back precautions Lower Body Dressing Details (indicate cue type and reason): assist with R LE mgmt, reviewed compensatory techniques and safety  Toilet Transfer: Supervision/safety;Ambulation           Functional mobility during ADLs: Supervision/safety General ADL Comments: reviewed ADl compensatory techniques for back precautions, continues to require min assist for R LE mgmt      Vision   Vision Assessment?: No apparent visual deficits     Perception     Praxis      Pertinent Vitals/Pain Pain Assessment: Faces Faces Pain Scale: Hurts a little bit Pain Location: back  Pain Descriptors / Indicators: Discomfort Pain Intervention(s): Monitored  during session;Repositioned     Hand Dominance     Extremity/Trunk Assessment Upper Extremity Assessment Upper Extremity Assessment: Overall WFL for tasks assessed   Lower Extremity Assessment Lower Extremity Assessment: Defer to PT evaluation   Cervical / Trunk Assessment Cervical / Trunk Assessment: Other  exceptions Cervical / Trunk Exceptions: s/p back surgery    Communication Communication Communication: No difficulties   Cognition Arousal/Alertness: Awake/alert Behavior During Therapy: WFL for tasks assessed/performed Overall Cognitive Status: Within Functional Limits for tasks assessed                                     General Comments  patients daughter present and agreeable     Exercises     Shoulder Instructions      Home Living Family/patient expects to be discharged to:: Private residence Living Arrangements: Spouse/significant other Available Help at Discharge: Family;Available 24 hours/day Type of Home: House Home Access: Stairs to enter [employer] of Steps: 3 Entrance Stairs-Rails: Right Home Layout: Laundry or work area in basement     [employer] Shower/Tub: Occupational psychologist: [place]: Civil engineer, contracting - built in;Grab bars - toilet;Grab bars - tub/shower          Prior Functioning/Environment Level of Independence: Independent                 OT Problem List: Decreased knowledge of precautions;Decreased knowledge of use of DME or AE      OT Treatment/Interventions:      OT Goals(Current goals can be found in the care plan section) Acute Rehab OT Goals Patient Stated Goal: to return home OT Goal Formulation: With patient  OT Frequency:     Barriers to D/C:            Co-evaluation              AM-PAC OT "6 Clicks" Daily Activity     Outcome Measure Help from another person eating meals?: None Help from another person taking care of personal grooming?: None Help from another person toileting, which includes using toliet, bedpan, or urinal?: A Little Help from another person bathing (including washing, rinsing, drying)?: A Little Help from another person to put on and taking off regular upper body clothing?: None Help from another person to put on and taking off regular  lower body clothing?: A Little 6 Click Score: 21   End of Session Equipment Utilized During Treatment: Back brace Nurse Communication: Mobility status  Activity Tolerance: Patient tolerated treatment well Patient left: with call bell/phone within reach  OT Visit Diagnosis: Other abnormalities of gait and mobility (R26.89)                Time: 1245-8099 OT Time Calculation (min): 13 min Charges:  OT General Charges $OT Visit: 1 Visit OT Evaluation $OT Eval Low Complexity: 1 Low  Jolaine Artist, OT Acute Rehabilitation Services Pager [phone] Office [phone]   Delight Stare 11/10/2019, 1:13 PM

## 2019-11-10 NOTE — Evaluation (Signed)
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  He had a persistent left lumbar radiculopathy and this gradually similar down.  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  S/P decompression L4-5  Clinical Impression  Patient received sitting up on side of bed, dressed, back brace donned. He is instructed un use and wear of brace. Instructed in log rolling, with return demo. He is abl;e to transfer with supervision and ambulated 250 feet  And up down 6 steps with supervision. He is safe to go home with family assist at this time. No continued acute needs      Follow Up Recommendations No PT follow up    Equipment Recommendations  None recommended by PT    Recommendations for Other Services       Precautions / Restrictions Precautions Precautions: Back Required Braces or Orthoses: Spinal Brace Spinal Brace: Applied in sitting position;Lumbar corset Restrictions Weight Bearing Restrictions: No      Mobility  Bed Mobility Overal bed mobility: Modified Independent             General bed mobility comments: instructed in log rollng technique  Transfers Overall transfer level: Independent                  Ambulation/Gait Ambulation/Gait assistance: Supervision Gait Distance (Feet): 250 Feet Assistive device: None Gait Pattern/deviations: WFL(Within Functional Limits) Gait velocity: WNL   General Gait Details: no difficulty or lob with mobility  Stairs Stairs: Yes Stairs assistance: Supervision Stair Management: One rail Left Number of  Stairs: 6 General stair comments: safe on steps  Wheelchair Mobility    Modified Rankin (Stroke Patients Only)       Balance Overall balance assessment: Independent                                           Pertinent Vitals/Pain Pain Assessment: Faces Faces Pain Scale: Hurts a little bit Pain Location: right calf Pain Descriptors / Indicators: Aching;Sore Pain Intervention(s): Monitored during session    Home Living Family/patient expects to be discharged to:: Private residence Living Arrangements: Spouse/significant other Available Help at Discharge: Family;Available 24 hours/day Type of Home: House Home Access: Stairs to enter Entrance Stairs-Rails: Right Entrance Stairs-Number of Steps: 3 Home Layout: Laundry or work area in basement Home Equipment: None      Prior Function Level of Independence: Independent               Hand Dominance        Extremity/Trunk Assessment   Upper Extremity Assessment Upper Extremity Assessment: Overall WFL for tasks assessed    Lower Extremity Assessment Lower Extremity Assessment: Generalized weakness    Cervical / Trunk Assessment Cervical / Trunk Assessment: Normal  Communication   Communication: No difficulties  Cognition Arousal/Alertness: Awake/alert Behavior During Therapy: WFL for tasks assessed/performed Overall Cognitive Status: Within Functional Limits for tasks assessed                                          General Comments      Exercises     Assessment/Plan    PT Assessment All further PT needs can be met in the next venue of care  PT Problem List Decreased strength;Decreased mobility       PT Treatment Interventions      PT Goals (Current goals can be found in the Care Plan section)  Acute Rehab PT Goals Patient Stated Goal: to return home PT Goal Formulation: With patient/family Time For Goal Achievement: 11/11/19 Potential to Achieve Goals:  Good    Frequency     Barriers to discharge        Co-evaluation               AM-PAC PT "6 Clicks" Mobility  Outcome Measure Help needed turning from your back to your side while in a flat bed without using bedrails?: None Help needed moving from lying on your back to sitting on the side of a flat bed without using bedrails?: None Help needed moving to and from a bed to a chair (including a wheelchair)?: None Help needed standing up from a chair using your arms (e.g., wheelchair or bedside chair)?: None Help needed to walk in hospital room?: None Help needed climbing 3-5 steps with a railing? : None 6 Click Score: 24    End of Session Equipment Utilized During Treatment: Back brace Activity Tolerance: Patient tolerated treatment well Patient left: with nursing/sitter in room;Other (comment) (with OT present) Nurse Communication: Mobility status PT Visit Diagnosis: Muscle weakness (generalized) (M62.81)    Time: 1002-1013 PT Time Calculation (min) (ACUTE ONLY): 11 min   Charges:   PT Evaluation $PT Eval Low Complexity: 1 Low          Kristyn Conetta, PT, [facility] 11/10/19,10:27 AM   

## 2019-12-08 ENCOUNTER — Ambulatory Visit: Payer: BC Managed Care – PPO | Admitting: Family Medicine

## 2019-12-18 ENCOUNTER — Encounter: Payer: Self-pay | Admitting: Family Medicine

## 2019-12-18 DIAGNOSIS — E1165 Type 2 diabetes mellitus with hyperglycemia: Secondary | ICD-10-CM

## 2019-12-18 DIAGNOSIS — E1140 Type 2 diabetes mellitus with diabetic neuropathy, unspecified: Secondary | ICD-10-CM

## 2019-12-20 NOTE — Telephone Encounter (Signed)
Pt requesting a meter kit for testing as well as supplies no previous order also unsure how frequently should be testing.

## 2019-12-21 MED ORDER — BLOOD GLUCOSE METER KIT: PACK | 0 refills | Status: DC

## 2019-12-21 NOTE — Telephone Encounter (Signed)
Ordered but printed, will sign and have faxed tomorrow

## 2020-02-07 ENCOUNTER — Ambulatory Visit: Payer: BC Managed Care – PPO | Admitting: Family Medicine

## 2020-02-07 ENCOUNTER — Encounter: Payer: Self-pay | Admitting: Family Medicine

## 2020-02-07 ENCOUNTER — Other Ambulatory Visit: Payer: Self-pay

## 2020-02-07 VITALS — BP 133/87 | HR 87 | Temp 98.3°F | Ht 65.0 in | Wt 211.0 lb

## 2020-02-07 DIAGNOSIS — E1140 Type 2 diabetes mellitus with diabetic neuropathy, unspecified: Secondary | ICD-10-CM

## 2020-02-07 DIAGNOSIS — E1165 Type 2 diabetes mellitus with hyperglycemia: Secondary | ICD-10-CM | POA: Diagnosis not present

## 2020-02-07 LAB — COMPREHENSIVE METABOLIC PANEL
ALT: 66 IU/L — ABNORMAL HIGH (ref 0–44)
AST: 58 IU/L — ABNORMAL HIGH (ref 0–40)
Albumin/Globulin Ratio: 1.8 (ref 1.2–2.2)
Albumin: 4.8 g/dL (ref 3.8–4.8)
Alkaline Phosphatase: 122 IU/L — ABNORMAL HIGH (ref 44–121)
BUN/Creatinine Ratio: 16 (ref 10–24)
BUN: 16 mg/dL (ref 8–27)
Bilirubin Total: 0.4 mg/dL (ref 0.0–1.2)
CO2: 23 mmol/L (ref 20–29)
Calcium: 9.6 mg/dL (ref 8.6–10.2)
Chloride: 101 mmol/L (ref 96–106)
Creatinine, Ser: 0.97 mg/dL (ref 0.76–1.27)
GFR calc Af Amer: 96 mL/min/{1.73_m2} (ref >59)
GFR calc non Af Amer: 83 mL/min/{1.73_m2} (ref >59)
Globulin, Total: 2.6 g/dL (ref 1.5–4.5)
Glucose: 190 mg/dL — ABNORMAL HIGH (ref 65–99)
Potassium: 4 mmol/L (ref 3.5–5.2)
Sodium: 139 mmol/L (ref 134–144)
Total Protein: 7.4 g/dL (ref 6.0–8.5)

## 2020-02-07 LAB — POCT GLYCOSYLATED HEMOGLOBIN (HGB A1C): Hemoglobin A1C: 8.9 % — AB (ref 4.0–5.6)

## 2020-02-07 MED ORDER — BLOOD GLUCOSE METER KIT: PACK | 0 refills | Status: DC

## 2020-02-07 MED ORDER — BASAGLAR KWIKPEN 100 UNIT/ML ~~LOC~~ SOPN: 24.0000 [IU] | PEN_INJECTOR | Freq: Every day | SUBCUTANEOUS | 3 refills | Status: DC

## 2020-02-07 NOTE — Patient Instructions (Addendum)
Increase Basaglar to 24 units/day for this week.  If readings remain above 200 then increase again next week to 26 units/day.  If any low blood sugar readings with these increases, return to previous dose and let me know.  Give me an update in 2 weeks on her home readings and we can discuss some further changes.  We also have the option of adjusting Trulicity 1 more time, but will just adjust Basaglar for now.   If you have lab work done today you will be contacted with your lab results within the next 2 weeks.  If you have not heard from [place] then please contact us. The fastest way to get your results is to register for My Chart.   IF you received an x-ray today, you will receive an invoice from [facility]. Please contact [facility] at [phone] with questions or concerns regarding your invoice.   IF you received labwork today, you will receive an invoice from [facility]. Please contact [facility] at [phone] with questions or concerns regarding your invoice.   Our billing staff will not be able to assist you with questions regarding bills from these companies.  You will be contacted with the lab results as soon as they are available. The fastest way to get your results is to activate your My Chart account. Instructions are located on the last page of this paperwork. If you have not heard from [place] regarding the results in 2 weeks, please contact this office.

## 2020-02-07 NOTE — Progress Notes (Signed)
Subjective:  Patient ID: Francis Cross, male    DOB: 1955-10-28  Age: 64 y.o. MRN: [id number]  CC:  Chief Complaint  Patient presents with  . Follow-up    on diabetes. PT reports no issues with this condition since last OV. pt reports his home reading have been higher than he would like, but no other issues to report. PT reported he never got the Glucose meter kit we sent in for him.    HPI Francis Cross presents for  Diabetes: Complicated by hyperglycemia, diabetic neuropathy. Spine surgery in September. Doing ok - recovering well. Still occasional soreness with activity.  Treated with Basaglar -increased to 20 units, then 22 units based on his last visit.  Currently taking 22 units daily and Trulicity 77m once per week. Rare day late on trulicity, no missed doses. No new side effects of meds.  He is on Praluent for lipids, ARB with losartan. Microalbumin: normal ratio in 06/2019.  Optho, foot exam, pneumovax: up to date.  Flu vaccine - declines.  covid vaccine - declines. Had disease 12/05/19. Recovered. No hospitalization.   Walks for exercise.  Home readings:  Fasting: 160-190. Lowest 115 on occasion, usually in 160+ 2hr PP: 200's.  Aware of risk of hypoglycemia with med changes - info given last visit.  Needs new meter - did not receive.   Lab Results  Component Value Date   HGBA1C 8.5 (A) 09/16/2019   HGBA1C 8.8 (H) 06/14/2019   HGBA1C 7.7 (H) 01/15/2019   Lab Results  Component Value Date   MICROALBUR 0.5 09/21/2015   LDLCALC 66 09/16/2019   CREATININE 1.10 11/10/2019     History Patient Active Problem List   Diagnosis Date Noted  . Spondylisthesis 11/09/2019  . Pain in limb 07/27/2019  . Headache 07/27/2019  . Carotid stenosis 07/27/2019  . Special screening for malignant neoplasms, colon   . Difficult intubation   . Prostatitis 06/03/2018  . Paresthesia 03/11/2018  . Cough 03/04/2018  . Basal cell carcinoma of neck 12/02/2017  . Basal cell carcinoma of  temple region 12/02/2017  . Herniated nucleus pulposus, L4-5 left 09/23/2017  . Degeneration of lumbar intervertebral disc 06/26/2017  . Hx of carpal tunnel repair 12/22/2015  . Testosterone deficiency 05/23/2014  . Pain in the chest 10/01/2013  . GOUT, UNSPECIFIED 01/29/2010  . Obesity 12/06/2008  . IRON OVERLOAD 08/11/2007  . HIATAL HERNIA WITH REFLUX 07/08/2006  . Hypercholesteremia 06/12/2006  . Essential hypertension 06/12/2006  . 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 Acid reflux   . Allergy   . Arthritis    hands  . [place]    skin cancer- basal cell on nose  . Cataract   . Chronic kidney disease    Kidney stones  . Diabetes mellitus without complication ([place]   . Difficult intubation   . Erectile dysfunction   . Fatty liver   . Gout   . Hemochromatosis   . Hiatal hernia   . History of kidney stones   . Hyperlipidemia   . Hypertension   . Iron overload   . Obesity   . Sciatica    bilateral, intermittent   Past Surgical History:  Procedure Laterality Date  . APPENDECTOMY    . BACK SURGERY  09/09/2017   lumbar discectomy   . CARDIAC CATHETERIZATION     [facility]  . CATARACT EXTRACTION W/PHACO Right 12/15/2018   Procedure: CATARACT EXTRACTION PHACO AND INTRAOCULAR LENS PLACEMENT (IOC)  RIGHT DIABETIC 00:35.7  16.3%  5.81;  Surgeon: Birder Robson, MD;  Location: [facility];  Service: Ophthalmology;  Laterality: Right;  diabetic - insulin  . CATARACT EXTRACTION W/PHACO Left 01/05/2019   Procedure: CATARACT EXTRACTION PHACO AND INTRAOCULAR LENS PLACEMENT ([place]) LEFT DIABETIC;  Surgeon: Birder Robson, MD;  Location: [facility];  Service: Ophthalmology;  Laterality: Left;  0:31 16.4% 5.20  . CERVICAL FUSION    . COLONOSCOPY WITH PROPOFOL N/A 12/22/2018   Procedure: COLONOSCOPY WITH PROPOFOL;  Surgeon: Doran Stabler, MD;  [facility];  Service: Gastroenterology;  Laterality: N/A;  .  KIDNEY STONE SURGERY  03/07/2017   laser blast  . LUMBAR LAMINECTOMY/DECOMPRESSION MICRODISCECTOMY N/A 09/23/2017   Procedure: Lumbar Four-Five Redo Discectomy for epidural hematoma.;  Surgeon: Kristeen Miss, MD;  Location: [facility];  Service: Neurosurgery;  Laterality: N/A;  . TONSILLECTOMY     Allergies  Allergen Reactions  . Keflex [Cephalexin] Rash  . Penicillins Itching, Rash and Other (See Comments)    Has patient had a PCN reaction causing immediate rash, facial/tongue/throat swelling, SOB or lightheadedness with hypotension: No Has patient had a PCN reaction causing severe rash involving mucus membranes or skin necrosis: No Has patient had a PCN reaction that required hospitalization: No Has patient had a PCN reaction occurring within the last 10 years: No If all of the above answers are "NO", then may proceed with Cephalosporin use.    Prior to Admission medications   Medication Sig Start Date End Date Taking? Authorizing Provider  acetaminophen (TYLENOL) 325 MG tablet Take 650 mg by mouth every 6 (six) hours as needed for mild pain or moderate pain.    Yes [provider]  Alirocumab (PRALUENT) 150 MG/ML SOAJ Inject 150 mg into the skin every 14 (fourteen) days. 07/29/19  Yes Wellington Hampshire, MD  BD PEN NEEDLE NANO 2ND GEN 32G X 4 MM MISC FOR USE WITH BASAGLAR PEN 09/08/19  Yes [name], MD  blood glucose meter kit and supplies Dispense based on patient and insurance preference. Use up to 3 times daily as directed. Dispense with QS lancets, strips for 3 months, 3 refills. 12/21/19  Yes [name], MD  clotrimazole (LOTRIMIN) 1 % cream Apply 1 application topically 2 (two) times daily. As needed to  affected area. 09/16/19  Yes [name], MD  Dulaglutide (TRULICITY) 3 QI/6.9GE SOPN Inject 0.5 mLs (3 mg total) as directed once a week. 09/16/19  Yes [name], MD  Insulin Glargine [facility] KWIKPEN) 100 UNIT/ML Inject 0.18 mLs (18 Units total) into  the skin daily. Patient taking differently: Inject 20 Units into the skin daily.  09/16/19  Yes [name], MD  ketoconazole (NIZORAL) 2 % shampoo APPLY TOPICALLY 2 (TWO) TIMES A WEEK. 04/11/16  Yes Shawnee Knapp, MD  ketorolac (ACULAR) 0.5 % ophthalmic solution Place 1 drop into both eyes 2 (two) times daily as needed. 04/11/16  Yes Shawnee Knapp, MD  losartan-hydrochlorothiazide (HYZAAR) 100-25 MG tablet Take 1 tablet by mouth daily. 09/16/19  Yes [name], MD  methocarbamol (ROBAXIN) 500 MG tablet Take 1 tablet (500 mg total) by mouth every 6 (six) hours as needed for muscle spasms. 11/10/19  Yes Osie Cheeks, NP  omeprazole (PRILOSEC) 40 MG capsule Take 40 mg by mouth daily. 10/29/19  Yes [provider]  tamsulosin (FLOMAX) 0.4 MG CAPS capsule Take 0.4 mg by mouth daily.    Yes [provider]  HYDROmorphone (  DILAUDID) 2 MG tablet Take 1 tablet (2 mg total) by mouth every 3 (three) hours. Take 1-2 tablets by mouth every 3 hours as needed for pain Patient not taking: Reported on 02/07/2020 11/10/19   Osie Cheeks, NP  omeprazole (PRILOSEC) 20 MG capsule Take 1 capsule (20 mg total) by mouth daily. Patient not taking: Reported on 02/07/2020 04/24/16   Doran Stabler, MD   Social History   Socioeconomic History  . Marital status: Married    Spouse name: Not on file  . Number of children: 3  . Years of education: Not on file  . Highest education level: Not on file  Occupational History  . Occupation: retired  Tobacco Use  . Smoking status: Former Research scientist (life sciences)  . Smokeless tobacco: Former Systems developer  . Tobacco comment: socially as teenager  Vaping Use  . Vaping Use: Never used  Substance and Sexual Activity  . Alcohol use: Not Currently  . Drug use: No  . Sexual activity: Not on file  Other Topics Concern  . Not on file  Social History Narrative  . Not on file   Social Determinants of Health   Financial Resource Strain:   . Difficulty of Paying Living Expenses: Not on  file  Food Insecurity:   . Worried About Charity fundraiser in the Last Year: Not on file  . Ran Out of Food in the Last Year: Not on file  Transportation Needs:   . Lack of Transportation (Medical): Not on file  . Lack of Transportation (Non-Medical): Not on file  Physical Activity:   . Days of Exercise per Week: Not on file  . Minutes of Exercise per Session: Not on file  Stress:   . Feeling of Stress : Not on file  Social Connections:   . Frequency of Communication with Friends and Family: Not on file  . Frequency of Social Gatherings with Friends and Family: Not on file  . Attends Religious Services: Not on file  . Active Member of Clubs or Organizations: Not on file  . Attends Archivist Meetings: Not on file  . Marital Status: Not on file  Intimate Partner Violence:   . Fear of Current or Ex-Partner: Not on file  . Emotionally Abused: Not on file  . Physically Abused: Not on file  . Sexually Abused: Not on file    Review of Systems  Constitutional: Negative for fatigue and unexpected weight change.  Eyes: Negative for visual disturbance.  Respiratory: Negative for cough, chest tightness and shortness of breath.   Cardiovascular: Negative for chest pain, palpitations and leg swelling.  Gastrointestinal: Negative for abdominal pain and blood in stool.  Neurological: Negative for dizziness, light-headedness and headaches.     Objective:   Vitals:   02/07/20 1018  BP: 133/87  Pulse: 87  Temp: 98.3 F (36.8 C)  TempSrc: Temporal  SpO2: 97%  Weight: 211 lb (95.7 kg)  Height: '5\' 5"'  (1.651 m)     Physical Exam Vitals reviewed.  Constitutional:      Appearance: He is well-developed.  HENT:     Head: Normocephalic and atraumatic.  Eyes:     Pupils: Pupils are equal, round, and reactive to light.  Neck:     Vascular: No carotid bruit or JVD.  Cardiovascular:     Rate and Rhythm: Normal rate and regular rhythm.     Heart sounds: Normal heart sounds.  No murmur heard.   Pulmonary:     Effort: Pulmonary  effort is normal.     Breath sounds: Normal breath sounds. No rales.  Skin:    General: Skin is warm and dry.  Neurological:     Mental Status: He is alert and oriented to person, place, and time.      Results for orders placed or performed in visit on 02/07/20  POCT glycosylated hemoglobin (Hb A1C)  Result Value Ref Range   Hemoglobin A1C 8.9 (A) 4.0 - 5.6 %   HbA1c POC (<> result, manual entry)     HbA1c, POC (prediabetic range)     HbA1c, POC (controlled diabetic range)       Assessment & Plan:  Axyl Pagliarulo is a 64 y.o. male . Type 2 diabetes mellitus with hyperglycemia, without long-term current use of insulin (HCC) - Plan: Hemoglobin A1c  -Decreased control.  We will try increasing Basaglar to 24 units initially, then to 26 units after 1 week if persistent readings above 200s.  Hypoglycemia precautions discussed.  Update by MyChart in the next 2 weeks with home readings.  If persistent elevated postprandials, and closer to goal fasting, can change Trulicity to higher dose.  Recheck in 3 months.  New prescription for meter sent to pharmacy.  No orders of the defined types were placed in this encounter.  Patient Instructions       If you have lab work done today you will be contacted with your lab results within the next 2 weeks.  If you have not heard from [place] then please contact us. The fastest way to get your results is to register for My Chart.   IF you received an x-ray today, you will receive an invoice from [facility]. Please contact [facility] at [phone] with questions or concerns regarding your invoice.   IF you received labwork today, you will receive an invoice from [facility]. Please contact [facility] at [phone] with questions or concerns regarding your invoice.   Our billing staff will not be able to assist you with questions regarding bills from these companies.  You will be  contacted with the lab results as soon as they are available. The fastest way to get your results is to activate your My Chart account. Instructions are located on the last page of this paperwork. If you have not heard from [place] regarding the results in 2 weeks, please contact this office.         Signed, [name], MD [facility]

## 2020-02-13 ENCOUNTER — Other Ambulatory Visit: Payer: Self-pay | Admitting: Family Medicine

## 2020-02-13 DIAGNOSIS — R7989 Other specified abnormal findings of blood chemistry: Secondary | ICD-10-CM

## 2020-02-13 NOTE — Progress Notes (Signed)
lft

## 2020-02-18 ENCOUNTER — Ambulatory Visit: Payer: BC Managed Care – PPO | Admitting: Physician Assistant

## 2020-02-23 ENCOUNTER — Telehealth: Payer: Self-pay

## 2020-02-23 MED ORDER — REPATHA SURECLICK 140 MG/ML ~~LOC~~ SOAJ: 140.0000 mg | SUBCUTANEOUS | 11 refills | Status: DC

## 2020-02-23 NOTE — Telephone Encounter (Signed)
Called and lmomed the pt that they were approved for the repatha and rx sent. Instructed the pt to call us back if they have any issues

## 2020-02-24 ENCOUNTER — Other Ambulatory Visit: Payer: Self-pay | Admitting: Family Medicine

## 2020-02-24 DIAGNOSIS — B3749 Other urogenital candidiasis: Secondary | ICD-10-CM

## 2020-02-24 DIAGNOSIS — E1140 Type 2 diabetes mellitus with diabetic neuropathy, unspecified: Secondary | ICD-10-CM

## 2020-02-24 DIAGNOSIS — E1165 Type 2 diabetes mellitus with hyperglycemia: Secondary | ICD-10-CM

## 2020-02-24 NOTE — Telephone Encounter (Signed)
Requested medication (s) are due for refill today:  no  Requested medication (s) are on the active medication list: yes  Last refill:  10/13/2019  Future visit scheduled: no  Medication not assigned to a protocol, review manually  Requested Prescriptions  Pending Prescriptions Disp Refills   clotrimazole (LOTRIMIN) 1 % cream [Pharmacy Med Name: CLOTRIMAZOLE 1% TOPICAL CREAM] 30 g 1    Sig: APPLY 1 APPLICATION TOPICALLY 2 (TWO) TIMES DAILY. AS NEEDED TO AFFECTED AREA.      Off-Protocol Failed - 02/24/2020  1:38 PM      Failed - Medication not assigned to a protocol, review manually.      Passed - Valid encounter within last 12 months    Recent Outpatient Visits           2 weeks ago Type 2 diabetes mellitus with hyperglycemia, without long-term current use of insulin [facility])   [facility], Ranell Patrick, MD   4 months ago Type 2 diabetes mellitus with diabetic neuropathy, without long-term current use of insulin [facility])   [facility], Ranell Patrick, MD   5 months ago Type 2 diabetes mellitus with hyperglycemia, without long-term current use of insulin [facility])   [facility], Ranell Patrick, MD   8 months ago Hyperlipidemia, unspecified hyperlipidemia type   [facility], Ranell Patrick, MD   8 months ago Varicose veins of right lower extremity, unspecified whether complicated   [facility], Ranell Patrick, MD       Future Appointments             In 2 months [place] Anon, Mertie Clause, MD [facility]              Signed Prescriptions Disp Refills   TRULICITY 3 0000000 SOPN 6 mL 1    Sig: INJECT 0.5 MLS (3 MG TOTAL) AS DIRECTED ONCE A WEEK.      Endocrinology:  Diabetes - GLP-1 Receptor Agonists Failed - 02/24/2020  1:38 PM      Failed - HBA1C is between 0 and 7.9 and within 180 days    Hemoglobin A1C  Date Value Ref Range Status  02/07/2020 8.9 (A) 4.0 - 5.6 % Final   Hgb A1c MFr Bld  Date  Value Ref Range Status  06/14/2019 8.8 (H) 4.8 - 5.6 % Final    Comment:             Prediabetes: 5.7 - 6.4          Diabetes: >6.4          Glycemic control for adults with diabetes: <7.0           Passed - Valid encounter within last 6 months    Recent Outpatient Visits           2 weeks ago Type 2 diabetes mellitus with hyperglycemia, without long-term current use of insulin [facility])   [facility], Ranell Patrick, MD   4 months ago Type 2 diabetes mellitus with diabetic neuropathy, without long-term current use of insulin [facility])   [facility], Ranell Patrick, MD   5 months ago Type 2 diabetes mellitus with hyperglycemia, without long-term current use of insulin [facility])   [facility], Ranell Patrick, MD   8 months ago Hyperlipidemia, unspecified hyperlipidemia type   [facility], Ranell Patrick, MD   8 months  ago Varicose veins of right lower extremity, unspecified whether complicated   [facility], Ranell Patrick, MD       Future Appointments             In 2 months [place] Anon, Mertie Clause, MD [place], [facility]

## 2020-04-28 ENCOUNTER — Other Ambulatory Visit: Payer: Self-pay | Admitting: Family Medicine

## 2020-04-28 DIAGNOSIS — I10 Essential (primary) hypertension: Secondary | ICD-10-CM

## 2020-05-02 ENCOUNTER — Other Ambulatory Visit: Payer: Self-pay

## 2020-05-02 ENCOUNTER — Ambulatory Visit: Payer: BC Managed Care – PPO | Admitting: Cardiovascular Disease

## 2020-05-02 ENCOUNTER — Encounter: Payer: Self-pay | Admitting: Cardiovascular Disease

## 2020-05-02 VITALS — BP 134/82 | HR 77 | Ht 65.0 in | Wt 209.0 lb

## 2020-05-02 DIAGNOSIS — I10 Essential (primary) hypertension: Secondary | ICD-10-CM

## 2020-05-02 DIAGNOSIS — E785 Hyperlipidemia, unspecified: Secondary | ICD-10-CM

## 2020-05-02 DIAGNOSIS — I2510 Atherosclerotic heart disease of native coronary artery without angina pectoris: Secondary | ICD-10-CM | POA: Diagnosis not present

## 2020-05-02 NOTE — Patient Instructions (Signed)

## 2020-05-02 NOTE — Progress Notes (Signed)
Cardiology Office Note   Date:  05/02/2020   ID:  Francis Cross, DOB 12-02-1955, MRN [id number]  PCP:  [name], MD  Cardiologist:   Kathlyn Sacramento, MD   No chief complaint on file.     History of Present Illness: Francis Cross is a 65 y.o. male who is here today for regarding severe hyperlipidemia and mild LAD plaque noted on cardiac CTA done in 2019. He has known history of hypertension, hyperlipidemia and type 2 diabetes.  Previous nuclear stress test in 2015 was normal.  He has known history of intermittent atypical left-sided chest pains thought to be musculoskeletal. He has known history of severe hyperlipidemia with intolerance to statins and Zetia.    CTA of the coronary arteries in August 2019 showed a calcium score of 3 with mild soft plaque in the proximal LAD. Echocardiogram in March 2021 showed normal LV systolic function, grade 1 diastolic dysfunction and no significant valvular abnormalities. He had an ABI done in May 2021 which was normal and carotid Doppler showed mild nonobstructive disease.  He had back surgery last year with some improvement in his symptoms.  He was diagnosed with COVID-19 infection in October but has recovered from that.  He denies shortness of breath.  He has chronic intermittent left-sided chest pain which has not changed.   Past Medical History:  Diagnosis Date  . Acid reflux   . Allergy   . Arthritis    hands  . Cancer ([place])    skin cancer- basal cell on nose  . Cataract   . Chronic kidney disease    Kidney stones  . Diabetes mellitus without complication ([place])   . Difficult intubation   . Erectile dysfunction   . Fatty liver   . Gout   . Hemochromatosis   . Hiatal hernia   . History of kidney stones   . Hyperlipidemia   . Hypertension   . Iron overload   . Obesity   . Sciatica    bilateral, intermittent    Past Surgical History:  Procedure Laterality Date  . APPENDECTOMY    . BACK SURGERY  09/09/2017   lumbar  discectomy   . CARDIAC CATHETERIZATION     [facility]  . CATARACT EXTRACTION W/PHACO Right 12/15/2018   Procedure: CATARACT EXTRACTION PHACO AND INTRAOCULAR LENS PLACEMENT ([facility]) RIGHT DIABETIC 00:35.7  16.3%  5.81;  Surgeon: Birder Robson, MD;  Location: [place];  Service: Ophthalmology;  Laterality: Right;  diabetic - insulin  . CATARACT EXTRACTION W/PHACO Left 01/05/2019   Procedure: CATARACT EXTRACTION PHACO AND INTRAOCULAR LENS PLACEMENT ([place]) LEFT DIABETIC;  Surgeon: Birder Robson, MD;  Location: [place];  Service: Ophthalmology;  Laterality: Left;  0:31 16.4% 5.20  . CERVICAL FUSION    . COLONOSCOPY WITH PROPOFOL N/A 12/22/2018   Procedure: COLONOSCOPY WITH PROPOFOL;  Surgeon: Doran Stabler, MD;  [facility];  Service: Gastroenterology;  Laterality: N/A;  . KIDNEY STONE SURGERY  03/07/2017   laser blast  . LUMBAR LAMINECTOMY/DECOMPRESSION MICRODISCECTOMY N/A 09/23/2017   Procedure: Lumbar Four-Five Redo Discectomy for epidural hematoma.;  Surgeon: Kristeen Miss, MD;  Location: [place];  Service: Neurosurgery;  Laterality: N/A;  . TONSILLECTOMY       Current Outpatient Medications  Medication Sig Dispense Refill  . acetaminophen (TYLENOL) 325 MG tablet Take 650 mg by mouth every 6 (six) hours as needed for mild pain or moderate pain.     . BD PEN NEEDLE NANO 2ND GEN  32G X 4 MM MISC FOR USE WITH BASAGLAR PEN 100 each 3  . blood glucose meter kit and supplies Dispense based on patient and insurance preference. Use up to 3 times daily as directed. Dispense with QS lancets, strips for 3 months, 3 refills. 1 each 0  . clotrimazole (LOTRIMIN) 1 % cream Apply 1 application topically 2 (two) times daily. As needed to  affected area. 30 g 1  . Evolocumab (REPATHA SURECLICK) 409 MG/ML SOAJ Inject 140 mg into the skin every 14 (fourteen) days. 2 mL 11  . Insulin Glargine (BASAGLAR KWIKPEN) 100 UNIT/ML Inject 24 Units into the skin daily. With increases as  instructed by provider. 9 mL 3  . ketoconazole (NIZORAL) 2 % shampoo APPLY TOPICALLY 2 (TWO) TIMES A WEEK. 120 mL 1  . ketorolac (ACULAR) 0.5 % ophthalmic solution Place 1 drop into both eyes 2 (two) times daily as needed. 5 mL 1  . losartan-hydrochlorothiazide (HYZAAR) 100-25 MG tablet TAKE 1 TABLET BY MOUTH EVERY DAY 90 tablet 0  . omeprazole (PRILOSEC) 40 MG capsule Take 40 mg by mouth daily.    . tamsulosin (FLOMAX) 0.4 MG CAPS capsule Take 0.4 mg by mouth daily.    . TRULICITY 3 WJ/1.9JY SOPN INJECT 0.5 MLS (3 MG TOTAL) AS DIRECTED ONCE A WEEK. 6 mL 1   No current facility-administered medications for this visit.    Allergies:   Praluent [alirocumab], Keflex [cephalexin], and Penicillins    Social History:  The patient  reports that he has quit smoking. He has quit using smokeless tobacco. He reports previous alcohol use. He reports that he does not use drugs.   Family History:  The patient's family history includes Hyperlipidemia in his sister; Hypertension in his sister; Lung cancer in his father; Melanoma in his mother.    ROS:  Please see the history of present illness.   Otherwise, review of systems are positive for none.   All other systems are reviewed and negative.    PHYSICAL EXAM: VS:  BP 134/82   Pulse 77   Ht '5\' 5"'  (1.651 m)   Wt 209 lb (94.8 kg)   BMI 34.78 kg/m  , BMI Body mass index is 34.78 kg/m. GEN: Well nourished, well developed, in no acute distress  HEENT: normal  Neck: no JVD, carotid bruits, or masses Cardiac: RRR; no murmurs, rubs, or gallops,no edema  Respiratory:  clear to auscultation bilaterally, normal work of breathing GI: soft, nontender, nondistended, + BS MS: no deformity or atrophy  Skin: warm and dry, no rash Neuro:  Strength and sensation are intact Psych: euthymic mood, full affect   EKG:  EKG is ordered today. EKG showed normal sinus rhythm.  No significant ST or T wave changes.   Recent Labs: 06/14/2019: TSH 2.170 11/10/2019:  Hemoglobin 12.6; Platelets 132 02/07/2020: ALT 66; BUN 16; Creatinine, Ser 0.97; Potassium 4.0; Sodium 139    Lipid Panel    Component Value Date/Time   CHOL 142 09/16/2019 1123   TRIG 135 09/16/2019 1123   HDL 52 09/16/2019 1123   CHOLHDL 2.7 09/16/2019 1123   CHOLHDL 2.6 11/28/2016 0839   VLDL 29 07/16/2016 1034   LDLCALC 66 09/16/2019 1123   LDLCALC 52 11/28/2016 0839   LDLDIRECT 152.1 10/12/2008 0726      Wt Readings from Last 3 Encounters:  05/02/20 209 lb (94.8 kg)  02/07/20 211 lb (95.7 kg)  11/09/19 205 lb (93 kg)       No flowsheet data found.  ASSESSMENT AND PLAN:  1. Mild coronary atherosclerosis: Noted on previous CTA with no obstructive disease.  Continue treatment of risk factors.  Echocardiogram showed normal LV systolic function.  No anginal symptoms at the present time.  2. Severe hyperlipidemia: He is back on Repatha.  Most recent lipid profile showed an LDL of 66.  Known history of intolerance to statins.    3. Essential hypertension: Blood pressure is well controlled on current medication.  4.  Type 2 diabetes: Significant fluctuation in blood sugar and I asked him to follow-up with his primary care physician regarding that.   Disposition:   FU with me in 12 months.  Signed,  Kathlyn Sacramento, MD  05/02/2020 10:22 AM    [place]

## 2020-05-24 ENCOUNTER — Encounter: Payer: Self-pay | Admitting: Family Medicine

## 2020-05-24 DIAGNOSIS — R7989 Other specified abnormal findings of blood chemistry: Secondary | ICD-10-CM

## 2020-05-24 DIAGNOSIS — I10 Essential (primary) hypertension: Secondary | ICD-10-CM

## 2020-05-24 DIAGNOSIS — E1165 Type 2 diabetes mellitus with hyperglycemia: Secondary | ICD-10-CM

## 2020-05-29 ENCOUNTER — Other Ambulatory Visit: Payer: Self-pay | Admitting: Family Medicine

## 2020-05-29 DIAGNOSIS — B3749 Other urogenital candidiasis: Secondary | ICD-10-CM

## 2020-06-27 ENCOUNTER — Encounter: Payer: Self-pay | Admitting: Family Medicine

## 2020-07-05 ENCOUNTER — Other Ambulatory Visit: Payer: Self-pay

## 2020-07-05 DIAGNOSIS — E1140 Type 2 diabetes mellitus with diabetic neuropathy, unspecified: Secondary | ICD-10-CM

## 2020-07-05 DIAGNOSIS — E1165 Type 2 diabetes mellitus with hyperglycemia: Secondary | ICD-10-CM

## 2020-07-05 MED ORDER — TRULICITY 3 MG/0.5ML ~~LOC~~ SOAJ: 3.0000 mg | SUBCUTANEOUS | 1 refills | Status: DC

## 2020-07-05 NOTE — Telephone Encounter (Signed)
Pt called in asking for a refill on the Trulicity has an appt on 08/07/20 with Carlota Raspberry  Please advise

## 2020-07-10 ENCOUNTER — Telehealth: Payer: BC Managed Care – PPO | Admitting: Physician Assistant

## 2020-07-10 DIAGNOSIS — B9689 Other specified bacterial agents as the cause of diseases classified elsewhere: Secondary | ICD-10-CM

## 2020-07-10 DIAGNOSIS — J019 Acute sinusitis, unspecified: Secondary | ICD-10-CM | POA: Diagnosis not present

## 2020-07-11 MED ORDER — BENZONATATE 100 MG PO CAPS: 100.0000 mg | ORAL_CAPSULE | Freq: Three times a day (TID) | ORAL | 0 refills | Status: DC | PRN

## 2020-07-11 MED ORDER — AMOXICILLIN-POT CLAVULANATE 875-125 MG PO TABS: 1.0000 | ORAL_TABLET | Freq: Two times a day (BID) | ORAL | 0 refills | Status: DC

## 2020-07-11 NOTE — Progress Notes (Signed)
Message sent to patient requesting further input regarding current symptoms. Awaiting patient response.  

## 2020-07-11 NOTE — Progress Notes (Signed)
I have spent 5 minutes in review of e-visit questionnaire, review and updating patient chart, medical decision making and response to patient.   Marie Chow Cody Laine Fonner, PA-C    

## 2020-07-11 NOTE — Progress Notes (Signed)
We are sorry that you are not feeling well.  Here is how we plan to help!  Based on what you have shared with me it looks like you have sinusitis.  Sinusitis is inflammation and infection in the sinus cavities of the head.  Based on your presentation I believe you most likely have Acute Bacterial Sinusitis.  This is an infection caused by bacteria and is treated with antibiotics. I have prescribed Augmentin 875mg /125mg  one tablet twice daily with food, for 7 days. I have also sent in Tessalon [facility]) to help with cough. You may use an oral decongestant such as Mucinex D or if you have glaucoma or high blood pressure use plain Mucinex. Saline nasal spray help and can safely be used as often as needed for congestion.  If you develop worsening sinus pain, fever or notice severe headache and vision changes, or if symptoms are not better after completion of antibiotic, please schedule an appointment with a health care provider.    Sinus infections are not as easily transmitted as other respiratory infection, however we still recommend that you avoid close contact with loved ones, especially the very young and elderly.  Remember to wash your hands thoroughly throughout the day as this is the number one way to prevent the spread of infection!  Home Care:  Only take medications as instructed by your medical team.  Complete the entire course of an antibiotic.  Do not take these medications with alcohol.  A steam or ultrasonic humidifier can help congestion.  You can place a towel over your head and breathe in the steam from hot water coming from a faucet.  Avoid close contacts especially the very young and the elderly.  Cover your mouth when you cough or sneeze.  Always remember to wash your hands.  Get Help Right Away If:  You develop worsening fever or sinus pain.  You develop a severe head ache or visual changes.  Your symptoms persist after you have completed your treatment plan.  Make  sure you  Understand these instructions.  Will watch your condition.  Will get help right away if you are not doing well or get worse.  Your e-visit answers were reviewed by a board certified advanced clinical practitioner to complete your personal care plan.  Depending on the condition, your plan could have included both over the counter or prescription medications.  If there is a problem please reply  once you have received a response from your provider.  Your safety is important to [place].  If you have drug allergies check your prescription carefully.    You can use MyChart to ask questions about today's visit, request a non-urgent call back, or ask for a work or school excuse for 24 hours related to this e-Visit. If it has been greater than 24 hours you will need to follow up with your provider, or enter a new e-Visit to address those concerns.  You will get an e-mail in the next two days asking about your experience.  I hope that your e-visit has been valuable and will speed your recovery. Thank you for using e-visits.

## 2020-08-03 ENCOUNTER — Other Ambulatory Visit: Payer: Self-pay | Admitting: Family Medicine

## 2020-08-03 DIAGNOSIS — I10 Essential (primary) hypertension: Secondary | ICD-10-CM

## 2020-08-04 NOTE — Telephone Encounter (Signed)
Needs appt for refill will send curtesy once scheduled

## 2020-08-07 ENCOUNTER — Ambulatory Visit: Payer: BC Managed Care – PPO | Admitting: Family Medicine

## 2020-08-22 ENCOUNTER — Encounter: Payer: Self-pay | Admitting: Family Medicine

## 2020-08-24 ENCOUNTER — Encounter: Payer: Self-pay | Admitting: Family Medicine

## 2020-08-24 ENCOUNTER — Telehealth (INDEPENDENT_AMBULATORY_CARE_PROVIDER_SITE_OTHER): Payer: BC Managed Care – PPO | Admitting: Family Medicine

## 2020-08-24 DIAGNOSIS — J309 Allergic rhinitis, unspecified: Secondary | ICD-10-CM

## 2020-08-24 DIAGNOSIS — R7989 Other specified abnormal findings of blood chemistry: Secondary | ICD-10-CM

## 2020-08-24 DIAGNOSIS — E1165 Type 2 diabetes mellitus with hyperglycemia: Secondary | ICD-10-CM | POA: Diagnosis not present

## 2020-08-24 DIAGNOSIS — I10 Essential (primary) hypertension: Secondary | ICD-10-CM

## 2020-08-24 MED ORDER — LOSARTAN POTASSIUM-HCTZ 100-25 MG PO TABS: 1.0000 | ORAL_TABLET | Freq: Every day | ORAL | 1 refills | Status: DC

## 2020-08-24 MED ORDER — LEVOCETIRIZINE DIHYDROCHLORIDE 5 MG PO TABS: 5.0000 mg | ORAL_TABLET | Freq: Every evening | ORAL | 3 refills | Status: DC

## 2020-08-24 NOTE — Addendum Note (Signed)
Addended by: Patrcia Dolly on: 08/24/2020 04:20 PM   Modules accepted: Orders

## 2020-08-24 NOTE — Progress Notes (Signed)
Virtual Visit via Video Note  I connected with Francis Cross on 08/24/20 at 2:16 PM by a video enabled telemedicine application and verified that I am speaking with the correct person using two identifiers.  Patient location:home  My location: office - [place].    I discussed the limitations, risks, security and privacy concerns of performing an evaluation and management service by telephone and the availability of in person appointments. I also discussed with the patient that there may be a patient responsible charge related to this service. The patient expressed understanding and agreed to proceed, consent obtained. Initial half of visit video - changed to audio d/t poor connection.   Chief complaint:  Chief Complaint  Patient presents with   Diabetes    Pt doing well today, no concerns about medications at this time, working well.    Hypertension    Denies physical sxs, no concerns, feels well.    Allergies    Pt requesting a new Rx for Xyzal be sent in, reports used to be rx by allergist but has not seen them in a while.     History of Present Illness: Francis Cross is a 65 y.o. male  Chronic medication follow-up.  See my chart message.  Granddaughter with respiratory virus and he decided to change to virtual visit as a precaution. Last visit with me in December 2021.  31-monthfollow-up recommended.  Allergic rhinitis Has been seen by allergist, allergy and asthma in [place]  Dust mite, mold allergy.  Xyzal, Singulair recommended.  He has not like the way Singulair makes him feel.  Would like to continue Xyzal.  Feels like symptoms are improving with just Xyzal alone.  Feels like staying xyzal is working well.    Diabetes: Complicated by hyperglycemia and diabetic neuropathy.  Variable control of glucose when he required steroids for treatment of orthopedic conditions.  Did receive steroid injection for acquired trigger finger on June 6. A1c uncontrolled at December visit,  Basaglar increased to 24 units, then option to 26 units if persistent readings in the 200s.  He was continued on Trulicity with option of higher dose if persistent elevated postprandial readings.  Currently Trulicity 3 mg weekly. Also followed by cardiology, on RSantelfor hyperlipidemia.  Losartan HCTZ for hypertension. Currently not using basaglar - not using in past 3 months. Felt like sugar was coming down and didn't need it.  Still taking trulicity 394mper week.  Fasting readings: 150-180 2hr postprandial: 194-259 recently. 175-190 prior to injection in finger  Readings 101-127 before supper at times.  No symptomatic hypoglycemia.   Microalbumin: Normal ratio 06/14/2019 Optho, foot exam,pneumovax:  Due for Pneumovax.  Lab Results  Component Value Date   HGBA1C 8.9 (A) 02/07/2020   Elevated LFT's: AST increased from 38-58, ALT 54-66 from July 2021 to December.  Recommended 6 weeks repeat testing, but due to illness was not able to have labs done.  Denies abdominal pain, nausea, vomiting or yellowing of the skin or eyes.  Does not drink alcohol. Lab Results  Component Value Date   ALT 66 (H) 02/07/2020   AST 58 (H) 02/07/2020   ALKPHOS 122 (H) 02/07/2020   BILITOT 0.4 02/07/2020      Patient Active Problem List   Diagnosis Date Noted   Spondylisthesis 11/09/2019   Pain in limb 07/27/2019   Headache 07/27/2019   Carotid stenosis 07/27/2019   Special screening for malignant neoplasms, colon    Difficult intubation    Prostatitis 06/03/2018  Paresthesia 03/11/2018   Cough 03/04/2018   Basal cell carcinoma of neck 12/02/2017   Basal cell carcinoma of temple region 12/02/2017   Herniated nucleus pulposus, L4-5 left 09/23/2017   Degeneration of lumbar intervertebral disc 06/26/2017   Hx of carpal tunnel repair 12/22/2015   Testosterone deficiency 05/23/2014   Pain in the chest 10/01/2013   GOUT, UNSPECIFIED 01/29/2010   Obesity 12/06/2008   IRON OVERLOAD 08/11/2007    HIATAL HERNIA WITH REFLUX 07/08/2006   Hypercholesteremia 06/12/2006   Essential hypertension 06/12/2006   ERECTILE DYSFUNCTION, 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   Past Medical History:  Diagnosis Date   Acid reflux    Allergy    Arthritis    hands   Cancer ([place])    skin cancer- basal cell on nose   Cataract    Chronic kidney disease    Kidney stones   Diabetes mellitus without complication ([place])    Difficult intubation    Erectile dysfunction    Fatty liver    Gout    Hemochromatosis    Hiatal hernia    History of kidney stones    Hyperlipidemia    Hypertension    Iron overload    Obesity    Sciatica    bilateral, intermittent   Past Surgical History:  Procedure Laterality Date   APPENDECTOMY     BACK SURGERY  09/09/2017   lumbar discectomy    CARDIAC CATHETERIZATION     [facility]   CATARACT EXTRACTION W/PHACO Right 12/15/2018   Procedure: CATARACT EXTRACTION PHACO AND INTRAOCULAR LENS PLACEMENT ([facility]) RIGHT DIABETIC 00:35.7  16.3%  5.81;  Surgeon: Birder Robson, MD;  Location: [facility];  Service: Ophthalmology;  Laterality: Right;  diabetic - insulin   CATARACT EXTRACTION W/PHACO Left 01/05/2019   Procedure: CATARACT EXTRACTION PHACO AND INTRAOCULAR LENS PLACEMENT ([place]) LEFT DIABETIC;  Surgeon: Birder Robson, MD;  Location: [facility];  Service: Ophthalmology;  Laterality: Left;  0:31 16.4% 5.20   CERVICAL FUSION     COLONOSCOPY WITH PROPOFOL N/A 12/22/2018   Procedure: COLONOSCOPY WITH PROPOFOL;  Surgeon: Doran Stabler, MD;  Location: [facility];  Service: Gastroenterology;  Laterality: N/A;   KIDNEY STONE SURGERY  03/07/2017   laser blast   LUMBAR LAMINECTOMY/DECOMPRESSION MICRODISCECTOMY N/A 09/23/2017   Procedure: Lumbar Four-Five Redo Discectomy for epidural hematoma.;  Surgeon: Kristeen Miss, MD;  Location: [facility];  Service: Neurosurgery;  Laterality: N/A;   TONSILLECTOMY     Allergies  Allergen Reactions   Praluent [Alirocumab] Other (See  Comments)    headaches   Keflex [Cephalexin] Rash   Penicillins Itching, Rash and Other (See Comments)    Has patient had a PCN reaction causing immediate rash, facial/tongue/throat swelling, SOB or lightheadedness with hypotension: No Has patient had a PCN reaction causing severe rash involving mucus membranes or skin necrosis: No Has patient had a PCN reaction that required hospitalization: No Has patient had a PCN reaction occurring within the last 10 years: No If all of the above answers are "NO", then may proceed with Cephalosporin use.  Tolerates Amoxicillin/Augmentin per patient report   Prior to Admission medications   Medication Sig Start Date End Date Taking? Authorizing Provider  acetaminophen (TYLENOL) 325 MG tablet Take 650 mg by mouth every 6 (six) hours as needed for mild pain or moderate pain.     [provider]  BD PEN NEEDLE NANO 2ND GEN 32G X 4 MM MISC FOR USE WITH BASAGLAR PEN 09/08/19   [name], MD  benzonatate (TESSALON) 100 MG capsule Take 1 capsule (100 mg total) by mouth 3 (three) times daily as needed for cough. 07/11/20   Brunetta Jeans, PA-C  blood glucose meter kit and supplies Dispense based on patient and insurance preference. Use up to 3 times daily as directed. Dispense with QS lancets, strips for 3 months, 3 refills. 02/07/20   [name], MD  clotrimazole (LOTRIMIN) 1 % cream Apply 1 application topically 2 (two) times daily. As needed to  affected area. 09/16/19   [name], MD  Dulaglutide (TRULICITY) 3 DX/4.1OI SOPN Inject 3 mg as directed once a week. 07/05/20   [name], MD  Evolocumab (REPATHA SURECLICK) 786 MG/ML SOAJ Inject 140 mg into the skin every 14 (fourteen) days. 02/23/20   Wellington Hampshire, MD  Insulin Glargine (BASAGLAR KWIKPEN) 100 UNIT/ML Inject 24 Units into the skin daily. With increases as instructed by provider. 02/07/20   [name], MD  ketoconazole (NIZORAL) 2 % shampoo APPLY TOPICALLY 2  (TWO) TIMES A WEEK. 04/11/16   Shawnee Knapp, MD  ketorolac (ACULAR) 0.5 % ophthalmic solution Place 1 drop into both eyes 2 (two) times daily as needed. 04/11/16   Shawnee Knapp, MD  losartan-hydrochlorothiazide [facility]) 100-25 MG tablet TAKE 1 TABLET BY MOUTH EVERY DAY 08/04/20   [name], MD  omeprazole (PRILOSEC) 40 MG capsule Take 40 mg by mouth daily. 10/29/19   [provider]  tamsulosin (FLOMAX) 0.4 MG CAPS capsule Take 0.4 mg by mouth daily.    [provider]   Social History   Socioeconomic History   Marital status: Married    Spouse name: Not on file   Number of children: 3   Years of education: Not on file   Highest education level: Not on file  Occupational History   Occupation: retired  Tobacco Use   Smoking status: Former    Pack years: 0.00   Smokeless tobacco: Former   Tobacco comments:    socially as teenager  Scientific laboratory technician Use: Never used  Substance and Sexual Activity   Alcohol use: Not Currently   Drug use: No   Sexual activity: Not on file  Other Topics Concern   Not on file  Social History Narrative   Not on file   Social Determinants of Health   Financial Resource Strain: Not on file  Food Insecurity: Not on file  Transportation Needs: Not on file  Physical Activity: Not on file  Stress: Not on file  Social Connections: Not on file  Intimate Partner Violence: Not on file    Observations/Objective:  BP 131/89, blood sugar 160 this am.  Weight -198.   Wt Readings from Last 3 Encounters:  05/02/20 209 lb (94.8 kg)  02/07/20 211 lb (95.7 kg)  11/09/19 205 lb (93 kg)   Nontoxic appearance on video, does not appear jaundiced.  Speaking full sentences, appropriate responses.  All questions were answered with understanding of plan expressed  Assessment and Plan: Elevated LFTs  Essential hypertension  Type 2 diabetes mellitus with hyperglycemia, without long-term current use of insulin (HCC)  Weight improved above  compared to in office readings.  Does report some improved glycemic control since his last visit and was able to discontinue insulin.  Concerned that he still has some elevated postprandials and potentially may need to be on insulin with adjustment of dose.  Additionally may need to adjust Trulicity dose but will start with A1c  testing, refer to endocrinology to discuss regimen and plan.   Tolerating current regimen for hypertension, borderline but will remain on same dose meds based on home readings today.  Elevated LFTs noted last visit, slight increase from previous readings.  Asymptomatic.  Repeat testing.  Follow Up Instructions: Referred to endocrinology, 56-monthfollow-up.   I discussed the assessment and treatment plan with the patient. The patient was provided an opportunity to ask questions and all were answered. The patient agreed with the plan and demonstrated an understanding of the instructions.   The patient was advised to call back or seek an in-person evaluation if the symptoms worsen or if the condition fails to improve as anticipated.  I provided 25 minutes of non-face-to-face time during this encounter.   JWendie Agreste MD

## 2020-08-24 NOTE — Addendum Note (Signed)
Addended by: Patrcia Dolly on: 08/24/2020 04:21 PM   Modules accepted: Orders

## 2020-08-29 ENCOUNTER — Encounter: Payer: Self-pay | Admitting: Family Medicine

## 2020-09-05 ENCOUNTER — Other Ambulatory Visit: Payer: Self-pay | Admitting: Family Medicine

## 2020-09-06 LAB — COMPREHENSIVE METABOLIC PANEL
ALT: 29 IU/L (ref 0–44)
AST: 27 IU/L (ref 0–40)
Albumin/Globulin Ratio: 2.6 — ABNORMAL HIGH (ref 1.2–2.2)
Albumin: 4.7 g/dL (ref 3.8–4.8)
Alkaline Phosphatase: 79 IU/L (ref 44–121)
BUN/Creatinine Ratio: 18 (ref 10–24)
BUN: 20 mg/dL (ref 8–27)
Bilirubin Total: 0.5 mg/dL (ref 0.0–1.2)
CO2: 26 mmol/L (ref 20–29)
Calcium: 9.8 mg/dL (ref 8.6–10.2)
Chloride: 97 mmol/L (ref 96–106)
Creatinine, Ser: 1.14 mg/dL (ref 0.76–1.27)
Globulin, Total: 1.8 g/dL (ref 1.5–4.5)
Glucose: 188 mg/dL — ABNORMAL HIGH (ref 65–99)
Potassium: 4.1 mmol/L (ref 3.5–5.2)
Sodium: 137 mmol/L (ref 134–144)
Total Protein: 6.5 g/dL (ref 6.0–8.5)
eGFR: 72 mL/min/{1.73_m2} (ref >59)

## 2020-09-06 LAB — HEMOGLOBIN A1C
Est. average glucose Bld gHb Est-mCnc: 197 mg/dL
Hgb A1c MFr Bld: 8.5 % — ABNORMAL HIGH (ref 4.8–5.6)

## 2020-09-08 ENCOUNTER — Encounter: Payer: Self-pay | Admitting: Family Medicine

## 2020-09-20 ENCOUNTER — Other Ambulatory Visit: Payer: Self-pay

## 2020-09-20 ENCOUNTER — Ambulatory Visit: Payer: BC Managed Care – PPO | Admitting: Nurse Practitioner

## 2020-09-20 ENCOUNTER — Encounter: Payer: Self-pay | Admitting: Nurse Practitioner

## 2020-09-20 VITALS — BP 133/79 | HR 73 | Temp 98.1°F | Ht 65.0 in | Wt 201.0 lb

## 2020-09-20 DIAGNOSIS — Z7689 Persons encountering health services in other specified circumstances: Secondary | ICD-10-CM

## 2020-09-20 DIAGNOSIS — E1165 Type 2 diabetes mellitus with hyperglycemia: Secondary | ICD-10-CM

## 2020-09-20 DIAGNOSIS — M549 Dorsalgia, unspecified: Secondary | ICD-10-CM | POA: Diagnosis not present

## 2020-09-20 DIAGNOSIS — E782 Mixed hyperlipidemia: Secondary | ICD-10-CM

## 2020-09-20 DIAGNOSIS — L209 Atopic dermatitis, unspecified: Secondary | ICD-10-CM

## 2020-09-20 DIAGNOSIS — I10 Essential (primary) hypertension: Secondary | ICD-10-CM | POA: Diagnosis not present

## 2020-09-20 MED ORDER — OZEMPIC (2 MG/DOSE) 8 MG/3ML ~~LOC~~ SOPN
PEN_INJECTOR | SUBCUTANEOUS | 2 refills | Status: DC
Start: 2020-09-20 — End: 2020-12-21

## 2020-09-20 MED ORDER — TRIAMCINOLONE ACETONIDE 0.1 % EX OINT: 1.0000 "application " | TOPICAL_OINTMENT | Freq: Two times a day (BID) | CUTANEOUS | 2 refills | Status: DC

## 2020-09-20 NOTE — Progress Notes (Signed)
New Patient Office Visit  Subjective:  Patient ID: Francis Cross, male    DOB: 05-Jul-1955  Age: 65 y.o. MRN: [id number]  CC:  Chief Complaint  Patient presents with   New Patient (Initial Visit)    HPI Francis Cross presents to establish new primary care provider.  He has been seen previous primary care provider for some time, but provider moved to an area that is too far away for him.  Patient does have history of type 2 diabetes.  His most recent hemoglobin A1c was 8.5.  His previous PCP had discussed a referral to endocrinology.  Would like to try a new medication prior to referral.  He is currently on Trulicity 3 mg weekly.  He like to try Ozempic to see if this helps control blood sugars better.  Will consider referral to endocrinology if no improvement over next few months. Patient is having some back pain.  Pain is mostly in area of lower thoracic spine.  Has had previous discectomy and fusion lumbar spine.  Pain is directly above the area of prior fusion.  He does have an upcoming appointment with orthopedic provider on 10/13/2020.  Is afraid he is done something to dislodge or damage current fusion hardware. Patient is otherwise feeling well.  Has no other concerns or complaints.  He denies chest pain, chest pressure, or shortness of breath. He denies headaches or visual disturbances. He denies abdominal pain, nausea, vomiting, or changes in bowel or bladder habits.    Past Medical History:  Diagnosis Date   Acid reflux    Allergy    Arthritis    hands   Cancer ([place])    skin cancer- basal cell on nose   Cataract    Chronic kidney disease    Kidney stones   Diabetes mellitus without complication ([place])    Difficult intubation    Erectile dysfunction    Fatty liver    Gout    Hemochromatosis    Hiatal hernia    History of kidney stones    Hyperlipidemia    Hypertension    Iron overload    Obesity    Sciatica    bilateral, intermittent    Past Surgical History:   Procedure Laterality Date   APPENDECTOMY     BACK SURGERY  09/09/2017   lumbar discectomy    CARDIAC CATHETERIZATION     [facility]   CATARACT EXTRACTION W/PHACO Right 12/15/2018   Procedure: CATARACT EXTRACTION PHACO AND INTRAOCULAR LENS PLACEMENT (IOC) RIGHT DIABETIC 00:35.7  16.3%  5.81;  Surgeon: Birder Robson, MD;  Location: [place];  Service: Ophthalmology;  Laterality: Right;  diabetic - insulin   CATARACT EXTRACTION W/PHACO Left 01/05/2019   Procedure: CATARACT EXTRACTION PHACO AND INTRAOCULAR LENS PLACEMENT ([place]) LEFT DIABETIC;  Surgeon: Birder Robson, MD;  Location: [place];  Service: Ophthalmology;  Laterality: Left;  0:31 16.4% 5.20   CERVICAL FUSION     COLONOSCOPY WITH PROPOFOL N/A 12/22/2018   Procedure: COLONOSCOPY WITH PROPOFOL;  Surgeon: Doran Stabler, MD;  [facility];  Service: Gastroenterology;  Laterality: N/A;   KIDNEY STONE SURGERY  03/07/2017   laser blast   LUMBAR LAMINECTOMY/DECOMPRESSION MICRODISCECTOMY N/A 09/23/2017   Procedure: Lumbar Four-Five Redo Discectomy for epidural hematoma.;  Surgeon: Kristeen Miss, MD;  Location: [place];  Service: Neurosurgery;  Laterality: N/A;   TONSILLECTOMY      Family History  Problem Relation Age of Onset   Cancer Mother    Melanoma Mother  Lung cancer Father    Hyperlipidemia Sister    Hypertension Sister    Colon cancer Neg Hx    Stomach cancer Neg Hx    Rectal cancer Neg Hx    Esophageal cancer Neg Hx    Liver cancer Neg Hx     Social History   Socioeconomic History   Marital status: Married    Spouse name: Not on file   Number of children: 3   Years of education: Not on file   Highest education level: Not on file  Occupational History   Occupation: retired  Tobacco Use   Smoking status: Former   Smokeless tobacco: Former   Tobacco comments:    socially as teenager  Scientific laboratory technician Use: Never used  Substance and Sexual Activity   Alcohol use: Not  Currently   Drug use: No   Sexual activity: Yes  Other Topics Concern   Not on file  Social History Narrative   Not on file   Social Determinants of Health   Financial Resource Strain: Not on file  Food Insecurity: Not on file  Transportation Needs: Not on file  Physical Activity: Not on file  Stress: Not on file  Social Connections: Not on file  Intimate Partner Violence: Not on file    ROS Review of Systems  Constitutional:  Positive for activity change. Negative for chills and fever.       Activity limited due to increased back pain.  HENT:  Negative for congestion, postnasal drip, rhinorrhea, sinus pressure and sinus pain.   Eyes: Negative.   Respiratory:  Negative for cough, chest tightness, shortness of breath and wheezing.   Cardiovascular:  Negative for chest pain and palpitations.  Gastrointestinal:  Negative for constipation, diarrhea, nausea and vomiting.  Endocrine: Negative for cold intolerance, heat intolerance, polydipsia and polyuria.       Most recent hemoglobin A1c was 8.5.  Genitourinary: Negative.   Musculoskeletal:  Positive for arthralgias, back pain and myalgias.  Skin:  Negative for rash.  Allergic/Immunologic: Negative.   Neurological:  Negative for dizziness, weakness and headaches.  Hematological: Negative.   Psychiatric/Behavioral:  The patient is not nervous/anxious.    Objective:   Today's Vitals   09/20/20 1631 09/20/20 1638  BP: (!) 145/81 133/79  Pulse: 73   Temp: 98.1 F (36.7 C)   SpO2: 96%   Weight: 201 lb (91.2 kg)   Height: '5\' 5"'  (1.651 m)    Body mass index is 33.45 kg/m.   Physical Exam Vitals and nursing note reviewed.  Constitutional:      Appearance: Normal appearance. He is well-developed.  HENT:     Head: Normocephalic and atraumatic.     Nose: Nose normal.     Mouth/Throat:     Mouth: Mucous membranes are moist.     Pharynx: Oropharynx is clear.  Eyes:     Pupils: Pupils are equal, round, and reactive to  light.  Cardiovascular:     Rate and Rhythm: Normal rate and regular rhythm.     Pulses: Normal pulses.     Heart sounds: Normal heart sounds.  Pulmonary:     Effort: Pulmonary effort is normal.     Breath sounds: Normal breath sounds.  Abdominal:     Palpations: Abdomen is soft.  Musculoskeletal:        General: Normal range of motion.     Cervical back: Normal range of motion and neck supple.     Comments: The  patient does have back pain, worse with bending and twisting at the waist.  There are no visible or palpable abnormalities in the area where patient is complaining of increased pain.  Lymphadenopathy:     Cervical: No cervical adenopathy.  Skin:    General: Skin is warm and dry.     Capillary Refill: Capillary refill takes less than 2 seconds.  Neurological:     General: No focal deficit present.     Mental Status: He is alert and oriented to person, place, and time.  Psychiatric:        Mood and Affect: Mood normal.        Behavior: Behavior normal.        Thought Content: Thought content normal.        Judgment: Judgment normal.    Assessment & Plan:  1. Encounter to establish care Appointment today to establish new primary care provider.  2. Type 2 diabetes mellitus with hyperglycemia, without long-term current use of insulin (HCC) Changed Trulicity 3 mg weekly to Ozempic 2 mg weekly.  Goal is to keep blood sugars between 70 and 120.  Recheck hemoglobin A1c at next visit.  Will discuss referral to endocrinology if no improvement noted at next visit. - Semaglutide, 2 MG/DOSE, (OZEMPIC, 2 MG/DOSE,) 8 MG/3ML SOPN; Inject 63m [place] once weekly for DM2  Dispense: 3 mL; Refill: 2  3. Non-traumatic mid back pain Will x-ray thoracic and lumbar spine for further evaluation.  Patient should keep appointment with orthopedics as scheduled 10/13/2020. - DG Thoracic Spine W/Swimmers; Future - DG Lumbar Spine Complete; Future  4. Essential hypertension Stable.  Continue blood  pressure medication as prescribed.  Follow-up with cardiology as scheduled.  5. Mixed hyperlipidemia Continue all meds, including Repatha as prescribed.  6. Atopic dermatitis, unspecified type May use triamcinolone ointment up to twice daily as needed for atopic dermatitis.  New prescription sent to pharmacy today. - triamcinolone ointment (KENALOG) 0.1 %; Apply 1 application topically 2 (two) times daily.  Dispense: 80 g; Refill: 2   Problem List Items Addressed This Visit       Cardiovascular and Mediastinum   Essential hypertension     Endocrine   Type 2 diabetes mellitus with hyperglycemia, without long-term current use of insulin (HCC)   Relevant Medications   Semaglutide, 2 MG/DOSE, (OZEMPIC, 2 MG/DOSE,) 8 MG/3ML SOPN     Musculoskeletal and Integument   Atopic dermatitis   Relevant Medications   triamcinolone ointment (KENALOG) 0.1 %     Other   Encounter to establish care - Primary   Non-traumatic mid back pain   Relevant Orders   DG Thoracic Spine W/Swimmers (Completed)   DG Lumbar Spine Complete (Completed)   Mixed hyperlipidemia    Outpatient Encounter Medications as of 09/20/2020  Medication Sig   acetaminophen (TYLENOL) 325 MG tablet Take 650 mg by mouth every 6 (six) hours as needed for mild pain or moderate pain.    blood glucose meter kit and supplies Dispense based on patient and insurance preference. Use up to 3 times daily as directed. Dispense with QS lancets, strips for 3 months, 3 refills.   clotrimazole (LOTRIMIN) 1 % cream Apply 1 application topically 2 (two) times daily. As needed to  affected area.   Evolocumab (REPATHA SURECLICK) 1579MG/ML SOAJ Inject 140 mg into the skin every 14 (fourteen) days.   ketoconazole (NIZORAL) 2 % shampoo APPLY TOPICALLY 2 (TWO) TIMES A WEEK.   ketorolac (ACULAR) 0.5 % ophthalmic solution  Place 1 drop into both eyes 2 (two) times daily as needed.   levocetirizine (XYZAL ALLERGY 24HR) 5 MG tablet Take 1 tablet (5 mg  total) by mouth every evening.   losartan-hydrochlorothiazide (HYZAAR) 100-25 MG tablet Take 1 tablet by mouth daily.   omeprazole (PRILOSEC) 40 MG capsule Take 40 mg by mouth daily.   Semaglutide, 2 MG/DOSE, (OZEMPIC, 2 MG/DOSE,) 8 MG/3ML SOPN Inject 76m [place] once weekly for DM2   tamsulosin (FLOMAX) 0.4 MG CAPS capsule Take 0.4 mg by mouth daily.   triamcinolone ointment (KENALOG) 0.1 % Apply 1 application topically 2 (two) times daily.   [DISCONTINUED] Dulaglutide (TRULICITY) 3 MQI/3.4VQSOPN Inject 3 mg as directed once a week.   benzonatate (TESSALON) 100 MG capsule Take 1 capsule (100 mg total) by mouth 3 (three) times daily as needed for cough.   No facility-administered encounter medications on file as of 09/20/2020.    Follow-up: Return in about 3 months (around 12/21/2020) for diabetes with HgbA1c check, FBW a week prior to visit.   This note was dictated using [employer] analyst Rapid proofreading was performed to expedite the delivery of the information. Despite proofreading, phonetic errors will occur which are common with this voice recognition software. Please take this into consideration. If there are any concerns, please contact our office.    HRonnell Freshwater NP

## 2020-09-21 ENCOUNTER — Ambulatory Visit
Admission: RE | Admit: 2020-09-21 | Discharge: 2020-09-21 | Disposition: A | Discharge status: Home / Self Care | Payer: BC Managed Care – PPO | Source: Ambulatory Visit | Attending: Nurse Practitioner | Admitting: Nurse Practitioner

## 2020-09-21 DIAGNOSIS — M549 Dorsalgia, unspecified: Secondary | ICD-10-CM

## 2020-09-25 ENCOUNTER — Encounter: Payer: Self-pay | Admitting: Nurse Practitioner

## 2020-09-25 NOTE — Progress Notes (Signed)
Patient has upcoming appointment with orthopedics. MyChart message sent regarding x-ray results.

## 2020-09-26 ENCOUNTER — Encounter: Payer: Self-pay | Admitting: Family Medicine

## 2020-10-06 DIAGNOSIS — L209 Atopic dermatitis, unspecified: Secondary | ICD-10-CM | POA: Insufficient documentation

## 2020-10-06 DIAGNOSIS — Z7689 Persons encountering health services in other specified circumstances: Secondary | ICD-10-CM | POA: Insufficient documentation

## 2020-10-06 DIAGNOSIS — E782 Mixed hyperlipidemia: Secondary | ICD-10-CM | POA: Insufficient documentation

## 2020-10-06 DIAGNOSIS — M549 Dorsalgia, unspecified: Secondary | ICD-10-CM | POA: Insufficient documentation

## 2020-10-06 DIAGNOSIS — E1165 Type 2 diabetes mellitus with hyperglycemia: Secondary | ICD-10-CM | POA: Insufficient documentation

## 2020-10-13 ENCOUNTER — Encounter: Payer: Self-pay | Admitting: Nurse Practitioner

## 2020-10-16 ENCOUNTER — Other Ambulatory Visit: Payer: Self-pay | Admitting: Nurse Practitioner

## 2020-10-16 DIAGNOSIS — M5136 Other intervertebral disc degeneration, lumbar region: Secondary | ICD-10-CM

## 2020-10-16 MED ORDER — DICLOFENAC SODIUM 1 % EX GEL: 4.0000 g | Freq: Four times a day (QID) | CUTANEOUS | 2 refills | Status: DC

## 2020-10-16 NOTE — Progress Notes (Signed)
Sent new prescription for Voltaren gel to [facility] in [place].

## 2020-11-30 ENCOUNTER — Ambulatory Visit: Payer: BC Managed Care – PPO | Admitting: Internal Medicine

## 2020-11-30 NOTE — Progress Notes (Deleted)
Name: Francis Cross  MRN/ DOB: [id number], Apr 25, 1955   Age/ Sex: 65 y.o., male    PCP: Francis Freshwater, NP   Reason for Endocrinology Evaluation: Type 2 Diabetes Mellitus     Date of Initial Endocrinology Visit: 11/30/2020     PATIENT IDENTIFIER: Francis Cross is a 65 y.o. male with a past medical history of ***. The patient presented for initial endocrinology Cross visit on 11/30/2020 for consultative assistance with his diabetes management.    HPI: Francis Cross was    Diagnosed with DM in 2014 Prior Medications tried/Intolerance: *** Currently checking blood sugars *** x / day,  before breakfast and ***.  Hypoglycemia episodes : ***               Symptoms: ***                 Frequency: ***/  Hemoglobin A1c has ranged from 6.7% in 2016, peaking at 8.9% in 2021. Patient required assistance for hypoglycemia:  Patient has required hospitalization within the last 1 year from hyper or hypoglycemia:   In terms of diet, the patient ***   HOME DIABETES REGIMEN: Ozempic   Statin: On repatha  ACE-I/ARB: yes Prior Diabetic Education: {Yes/No:11203}   METER DOWNLOAD SUMMARY: Date range evaluated: *** Fingerstick Blood Glucose Tests = *** Average Number Tests/Day = *** Overall Mean FS Glucose = *** Standard Deviation = ***  BG Ranges: Low = *** High = ***   Hypoglycemic Events/30 Days: BG < 50 = *** Episodes of symptomatic severe hypoglycemia = ***   DIABETIC COMPLICATIONS: Microvascular complications:  *** Denies: CKD Last eye exam: Completed   Macrovascular complications:   Denies: CAD, PVD, CVA   PAST HISTORY: Past Medical History:  Past Medical History:  Diagnosis Date   Acid reflux    Allergy    Arthritis    hands   Cancer ([place])    skin cancer- basal cell on nose   Cataract    Chronic kidney disease    Kidney stones   Diabetes mellitus without complication ([place])    Difficult intubation    Erectile dysfunction    Fatty liver    Gout     Hemochromatosis    Hiatal hernia    History of kidney stones    Hyperlipidemia    Hypertension    Iron overload    Obesity    Sciatica    bilateral, intermittent   Past Surgical History:  Past Surgical History:  Procedure Laterality Date   APPENDECTOMY     BACK SURGERY  09/09/2017   lumbar discectomy    CARDIAC CATHETERIZATION     [facility]   CATARACT EXTRACTION W/PHACO Right 12/15/2018   Procedure: CATARACT EXTRACTION PHACO AND INTRAOCULAR LENS PLACEMENT ([facility]) RIGHT DIABETIC 00:35.7  16.3%  5.81;  Surgeon: Birder Robson, MD;  Location: [facility];  Service: Ophthalmology;  Laterality: Right;  diabetic - insulin   CATARACT EXTRACTION W/PHACO Left 01/05/2019   Procedure: CATARACT EXTRACTION PHACO AND INTRAOCULAR LENS PLACEMENT ([place]) LEFT DIABETIC;  Surgeon: Birder Robson, MD;  Location: [facility];  Service: Ophthalmology;  Laterality: Left;  0:31 16.4% 5.20   CERVICAL FUSION     COLONOSCOPY WITH PROPOFOL N/A 12/22/2018   Procedure: COLONOSCOPY WITH PROPOFOL;  Surgeon: Doran Stabler, MD;  [facility];  Service: Gastroenterology;  Laterality: N/A;   KIDNEY STONE SURGERY  03/07/2017   laser blast   LUMBAR LAMINECTOMY/DECOMPRESSION MICRODISCECTOMY N/A 09/23/2017   Procedure: Lumbar Four-Five  Redo Discectomy for epidural hematoma.;  Surgeon: Kristeen Miss, MD;  Location: [facility];  Service: Neurosurgery;  Laterality: N/A;   TONSILLECTOMY      Social History:  reports that he has quit smoking. He has quit using smokeless tobacco. He reports that he does not currently use alcohol. He reports that he does not use drugs. Family History:  Family History  Problem Relation Age of Onset   Cancer Mother    Melanoma Mother    Lung cancer Father    Hyperlipidemia Sister    Hypertension Sister    Colon cancer Neg Hx    Stomach cancer Neg Hx    Rectal cancer Neg Hx    Esophageal cancer Neg Hx    Liver cancer Neg Hx      HOME MEDICATIONS: Allergies as of  11/30/2020       Reactions   Praluent [alirocumab] Other (See Comments)   headaches   Keflex [cephalexin] Rash   Penicillins Itching, Rash, Other (See Comments)   Has patient had a PCN reaction causing immediate rash, facial/tongue/throat swelling, SOB or lightheadedness with hypotension: No Has patient had a PCN reaction causing severe rash involving mucus membranes or skin necrosis: No Has patient had a PCN reaction that required hospitalization: No Has patient had a PCN reaction occurring within the last 10 years: No If all of the above answers are "NO", then may proceed with Cephalosporin use. Tolerates Amoxicillin/Augmentin per patient report        Medication List        Accurate as of November 30, 2020  7:20 AM. If you have any questions, ask your nurse or doctor.          acetaminophen 325 MG tablet Commonly known as: TYLENOL Take 650 mg by mouth every 6 (six) hours as needed for mild pain or moderate pain.   blood glucose meter kit and supplies Dispense based on patient and insurance preference. Use up to 3 times daily as directed. Dispense with QS lancets, strips for 3 months, 3 refills.   clotrimazole 1 % cream Commonly known as: LOTRIMIN Apply 1 application topically 2 (two) times daily. As needed to  affected area.   diclofenac Sodium 1 % Gel Commonly known as: VOLTAREN Apply 4 g topically 4 (four) times daily.   ketoconazole 2 % shampoo Commonly known as: NIZORAL APPLY TOPICALLY 2 (TWO) TIMES A WEEK.   ketorolac 0.5 % ophthalmic solution Commonly known as: Acular Place 1 drop into both eyes 2 (two) times daily as needed.   levocetirizine 5 MG tablet Commonly known as: Xyzal Allergy 24HR Take 1 tablet (5 mg total) by mouth every evening.   losartan-hydrochlorothiazide 100-25 MG tablet Commonly known as: HYZAAR Take 1 tablet by mouth daily.   omeprazole 40 MG capsule Commonly known as: PRILOSEC Take 40 mg by mouth daily.   Ozempic (2 MG/DOSE)  8 MG/3ML Sopn Generic drug: Semaglutide (2 MG/DOSE) Inject 63m [place] once weekly for DM2   Repatha SureClick 1314MG/ML Soaj Generic drug: Evolocumab Inject 140 mg into the skin every 14 (fourteen) days.   tamsulosin 0.4 MG Caps capsule Commonly known as: FLOMAX Take 0.4 mg by mouth daily.   triamcinolone ointment 0.1 % Commonly known as: KENALOG Apply 1 application topically 2 (two) times daily.         ALLERGIES: Allergies  Allergen Reactions   Praluent [Alirocumab] Other (See Comments)    headaches   Keflex [Cephalexin] Rash   Penicillins Itching, Rash and  Other (See Comments)    Has patient had a PCN reaction causing immediate rash, facial/tongue/throat swelling, SOB or lightheadedness with hypotension: No Has patient had a PCN reaction causing severe rash involving mucus membranes or skin necrosis: No Has patient had a PCN reaction that required hospitalization: No Has patient had a PCN reaction occurring within the last 10 years: No If all of the above answers are "NO", then may proceed with Cephalosporin use.  Tolerates Amoxicillin/Augmentin per patient report     REVIEW OF SYSTEMS: A comprehensive ROS was conducted with the patient and is negative except as per HPI and below:  ROS    OBJECTIVE:   VITAL SIGNS: There were no vitals taken for this visit.   PHYSICAL EXAM:  General: Pt appears well and is in NAD  Lungs: Clear with good BS bilat with no rales, rhonchi, or wheezes  Heart: RRR with normal S1 and S2 and no gallops; no murmurs; no rub  Abdomen: Normoactive bowel sounds, soft, nontender, without masses or organomegaly palpable  Extremities:  Lower extremities - No pretibial edema. No lesions.  Skin: Normal texture and temperature to palpation. No rash noted. No Acanthosis nigricans/skin tags. No lipohypertrophy.  Neuro: MS is good with appropriate affect, pt is alert and Ox3    DM foot exam:    DATA REVIEWED:  Lab Results  Component Value Date    HGBA1C 8.5 (H) 09/05/2020   HGBA1C 8.9 (A) 02/07/2020   HGBA1C 8.5 (A) 09/16/2019   Lab Results  Component Value Date   MICROALBUR 0.5 09/21/2015   LDLCALC 66 09/16/2019   CREATININE 1.14 09/05/2020   Lab Results  Component Value Date   MICRALBCREAT 5 06/14/2019    Lab Results  Component Value Date   CHOL 142 09/16/2019   HDL 52 09/16/2019   LDLCALC 66 09/16/2019   LDLDIRECT 152.1 10/12/2008   TRIG 135 09/16/2019   CHOLHDL 2.7 09/16/2019        ASSESSMENT / PLAN / RECOMMENDATIONS:   1) Type 2 Diabetes Mellitus, ***controlled, With*** complications - Most recent A1c of *** %. Goal A1c < 7.0 %.    Plan: GENERAL: ***  MEDICATIONS: ***  EDUCATION / INSTRUCTIONS: BG monitoring instructions: Patient is instructed to check his blood sugars *** times a day, ***. Call Francis Cross if: BG persistently < 70  I reviewed the Rule of 15 for the treatment of hypoglycemia in detail with the patient. Literature supplied.   2) Diabetic complications:  Eye: Does *** have known diabetic retinopathy.  Neuro/ Feet: Does *** have known diabetic peripheral neuropathy. Renal: Patient does not have known baseline CKD. He is *** on an ACEI/ARB at present.  3) Lipids: Patient is *** on a statin.         Signed electronically by: Mack Guise, MD  [facility] Endocrinology  [facility] [address] [place], [place] [address] Phone: [phone] FAX: [phone]   CC: Francis Freshwater, NP [place] [address] Phone: [phone]  Fax: [phone]    Return to Endocrinology Cross as below: Future Appointments  Date Time Provider [place]  11/30/2020  8:30 AM Vedant Shehadeh, Melanie Crazier, MD [facility] None  12/05/2020  9:20 AM Wellington Hampshire, MD [facility]  12/14/2020  8:30 AM [facility] None  12/21/2020  9:30 AM Francis Freshwater, NP [facility] None   07/27/2021 10:30 AM [facility] 2 [facility] None  07/27/2021  11:30 AM Dew, Erskine Squibb, MD [facility] None

## 2020-12-05 ENCOUNTER — Ambulatory Visit (INDEPENDENT_AMBULATORY_CARE_PROVIDER_SITE_OTHER): Payer: BC Managed Care – PPO | Admitting: Cardiovascular Disease

## 2020-12-05 ENCOUNTER — Other Ambulatory Visit: Payer: Self-pay

## 2020-12-05 VITALS — BP 136/85 | HR 72 | Ht 65.0 in | Wt 200.0 lb

## 2020-12-05 DIAGNOSIS — E785 Hyperlipidemia, unspecified: Secondary | ICD-10-CM

## 2020-12-05 DIAGNOSIS — I2510 Atherosclerotic heart disease of native coronary artery without angina pectoris: Secondary | ICD-10-CM | POA: Diagnosis not present

## 2020-12-05 DIAGNOSIS — I10 Essential (primary) hypertension: Secondary | ICD-10-CM | POA: Diagnosis not present

## 2020-12-05 LAB — HEMOGLOBIN A1C
Est. average glucose Bld gHb Est-mCnc: 171 mg/dL
Hgb A1c MFr Bld: 7.6 % — ABNORMAL HIGH (ref 4.8–5.6)

## 2020-12-05 LAB — LIPID PANEL
Chol/HDL Ratio: 3 ratio (ref 0.0–5.0)
Cholesterol, Total: 141 mg/dL (ref 100–199)
HDL: 47 mg/dL (ref >39)
LDL Chol Calc (NIH): 66 mg/dL (ref 0–99)
Triglycerides: 167 mg/dL — ABNORMAL HIGH (ref 0–149)
VLDL Cholesterol Cal: 28 mg/dL (ref 5–40)

## 2020-12-05 NOTE — Progress Notes (Signed)
Cardiology Office Note   Date:  12/05/2020   ID:  Francis Cross, Francis Cross [birth year], MRN [id number]  PCP:  Ronnell Freshwater, NP  Cardiologist:   Kathlyn Sacramento, MD   No chief complaint on file.     History of Present Illness: Francis Cross is a 65 y.o. male who is here today for regarding severe hyperlipidemia and mild LAD plaque noted on cardiac CTA done in 2019. He has known history of hypertension, hyperlipidemia and type 2 diabetes.  Previous nuclear stress test in 2015 was normal.  He has known history of intermittent atypical left-sided chest pains thought to be musculoskeletal. He has known history of severe hyperlipidemia with intolerance to statins and Zetia.    CTA of the coronary arteries in August 2019 showed a calcium score of 3 with mild soft plaque in the proximal LAD. Echocardiogram in March 2021 showed normal LV systolic function, grade 1 diastolic dysfunction and no significant valvular abnormalities. He had an ABI done in May 2021 which was normal and carotid Doppler showed mild nonobstructive disease. He had back surgery in 2021 .  He has been doing well with no chest pain, shortness of breath or palpitations.  No leg pain with walking.  He continues to take Repatha regularly.   Past Medical History:  Diagnosis Date   Acid reflux    Allergy    Arthritis    hands   Cancer ([place])    skin cancer- basal cell on nose   Cataract    Chronic kidney disease    Kidney stones   Diabetes mellitus without complication ([place])    Difficult intubation    Erectile dysfunction    Fatty liver    Gout    Hemochromatosis    Hiatal hernia    History of kidney stones    Hyperlipidemia    Hypertension    Iron overload    Obesity    Sciatica    bilateral, intermittent    Past Surgical History:  Procedure Laterality Date   APPENDECTOMY     BACK SURGERY  09/09/2017   lumbar discectomy    CARDIAC CATHETERIZATION     [facility]   CATARACT EXTRACTION W/PHACO Right 12/15/2018    Procedure: CATARACT EXTRACTION PHACO AND INTRAOCULAR LENS PLACEMENT ([facility]) RIGHT DIABETIC 00:35.7  16.3%  5.81;  Surgeon: Birder Robson, MD;  Location: [facility];  Service: Ophthalmology;  Laterality: Right;  diabetic - insulin   CATARACT EXTRACTION W/PHACO Left 01/05/2019   Procedure: CATARACT EXTRACTION PHACO AND INTRAOCULAR LENS PLACEMENT ([place]) LEFT DIABETIC;  Surgeon: Birder Robson, MD;  Location: [facility];  Service: Ophthalmology;  Laterality: Left;  0:31 16.4% 5.20   CERVICAL FUSION     COLONOSCOPY WITH PROPOFOL N/A 12/22/2018   Procedure: COLONOSCOPY WITH PROPOFOL;  Surgeon: Doran Stabler, MD;  [facility];  Service: Gastroenterology;  Laterality: N/A;   KIDNEY STONE SURGERY  03/07/2017   laser blast   LUMBAR LAMINECTOMY/DECOMPRESSION MICRODISCECTOMY N/A 09/23/2017   Procedure: Lumbar Four-Five Redo Discectomy for epidural hematoma.;  Surgeon: Kristeen Miss, MD;  Location: [facility];  Service: Neurosurgery;  Laterality: N/A;   TONSILLECTOMY       Current Outpatient Medications  Medication Sig Dispense Refill   acetaminophen (TYLENOL) 325 MG tablet Take 650 mg by mouth every 6 (six) hours as needed for mild pain or moderate pain.      blood glucose meter kit and supplies Dispense based on patient and insurance preference. Use up to  3 times daily as directed. Dispense with QS lancets, strips for 3 months, 3 refills. 1 each 0   clotrimazole (LOTRIMIN) 1 % cream Apply 1 application topically 2 (two) times daily. As needed to  affected area. 30 g 1   diclofenac Sodium (VOLTAREN) 1 % GEL Apply 4 g topically 4 (four) times daily. 100 g 2   Evolocumab (REPATHA SURECLICK) 122 MG/ML SOAJ Inject 140 mg into the skin every 14 (fourteen) days. 2 mL 11   ketoconazole (NIZORAL) 2 % shampoo APPLY TOPICALLY 2 (TWO) TIMES A WEEK. 120 mL 1   ketorolac (ACULAR) 0.5 % ophthalmic solution Place 1 drop into both eyes 2 (two) times daily as needed. 5 mL 1    levocetirizine (XYZAL ALLERGY 24HR) 5 MG tablet Take 1 tablet (5 mg total) by mouth every evening. 90 tablet 3   losartan-hydrochlorothiazide (HYZAAR) 100-25 MG tablet Take 1 tablet by mouth daily. 90 tablet 1   omeprazole (PRILOSEC) 40 MG capsule Take 40 mg by mouth daily.     Semaglutide, 2 MG/DOSE, (OZEMPIC, 2 MG/DOSE,) 8 MG/3ML SOPN Inject 29m [place] once weekly for DM2 3 mL 2   tamsulosin (FLOMAX) 0.4 MG CAPS capsule Take 0.4 mg by mouth daily.     triamcinolone ointment (KENALOG) 0.1 % Apply 1 application topically 2 (two) times daily. 80 g 2   No current facility-administered medications for this visit.    Allergies:   Praluent [alirocumab], Keflex [cephalexin], and Penicillins    Social History:  The patient  reports that he has quit smoking. He has quit using smokeless tobacco. He reports that he does not currently use alcohol. He reports that he does not use drugs.   Family History:  The patient's family history includes Cancer in his mother; Hyperlipidemia in his sister; Hypertension in his sister; Lung cancer in his father; Melanoma in his mother.    ROS:  Please see the history of present illness.   Otherwise, review of systems are positive for none.   All other systems are reviewed and negative.    PHYSICAL EXAM: VS:  BP 136/85   Pulse 72   Ht '5\' 5"'  (1.651 m)   Wt 200 lb (90.7 kg)   SpO2 98%   BMI 33.28 kg/m  , BMI Body mass index is 33.28 kg/m. GEN: Well nourished, well developed, in no acute distress  HEENT: normal  Neck: no JVD, carotid bruits, or masses Cardiac: RRR; no murmurs, rubs, or gallops,no edema  Respiratory:  clear to auscultation bilaterally, normal work of breathing GI: soft, nontender, nondistended, + BS MS: no deformity or atrophy  Skin: warm and dry, no rash Neuro:  Strength and sensation are intact Psych: euthymic mood, full affect   EKG:  EKG is ordered today. EKG showed normal sinus rhythm with nonspecific T wave changes.   Recent  Labs: 09/05/2020: ALT 29; BUN 20; Creatinine, Ser 1.14; Potassium 4.1; Sodium 137    Lipid Panel    Component Value Date/Time   CHOL 142 09/16/2019 1123   TRIG 135 09/16/2019 1123   HDL 52 09/16/2019 1123   CHOLHDL 2.7 09/16/2019 1123   CHOLHDL 2.6 11/28/2016 0839   VLDL 29 07/16/2016 1034   LDLCALC 66 09/16/2019 1123   LDLCALC 52 11/28/2016 0839   LDLDIRECT 152.1 10/12/2008 0726      Wt Readings from Last 3 Encounters:  12/05/20 200 lb (90.7 kg)  09/20/20 201 lb (91.2 kg)  05/02/20 209 lb (94.8 kg)  No flowsheet data found.    ASSESSMENT AND PLAN:  1. Mild coronary atherosclerosis: Noted on previous CTA with no obstructive disease.  Continue treatment of risk factors.  Echocardiogram showed normal LV systolic function.  No anginal symptoms at the present time.  2. Severe hyperlipidemia: Most recent lipid profile showed an LDL of 66 on Repatha which will be continued.  He is due for repeat lipid profile which was ordered today.  3. Essential hypertension: Blood pressure is well controlled on current medication.  4.  Type 2 diabetes: He is due for hemoglobin A1c which was requested today and will be forwarded to his primary care physician.  He reports improvement in blood sugar control on Ozempic.   Disposition:   FU with me in 12 months.  Signed,  Kathlyn Sacramento, MD  12/05/2020 9:42 AM     [facility]

## 2020-12-05 NOTE — Patient Instructions (Signed)
Medication Instructions:  No changes *If you need a refill on your cardiac medications before your next appointment, please call your pharmacy*   Lab Work: Your provider would like for you to have the following labs today: A1C and Lipid  If you have labs (blood work) drawn today and your tests are completely normal, you will receive your results only by: [place] (if you have MyChart) OR A paper copy in the mail If you have any lab test that is abnormal or we need to change your treatment, we will call you to review the results.   Testing/Procedures: None ordered   Follow-Up: At [facility], you and your health needs are our priority.  As part of our continuing mission to provide you with exceptional heart care, we have created designated Provider Care Teams.  These Care Teams include your primary Cardiologist (physician) and Advanced Practice Providers (APPs -  Physician Assistants and Nurse Practitioners) who all work together to provide you with the care you need, when you need it.  We recommend signing up for the patient portal called "MyChart".  Sign up information is provided on this After Visit Summary.  MyChart is used to connect with patients for Virtual Visits (Telemedicine).  Patients are able to view lab/test results, encounter notes, upcoming appointments, etc.  Non-urgent messages can be sent to your provider as well.   To learn more about what you can do with MyChart, go to [email or web address].    Your next appointment:   12 month(s)  The format for your next appointment:   In Person  Provider:   Kathlyn Sacramento, MD

## 2020-12-14 ENCOUNTER — Other Ambulatory Visit: Payer: BC Managed Care – PPO

## 2020-12-21 ENCOUNTER — Ambulatory Visit (INDEPENDENT_AMBULATORY_CARE_PROVIDER_SITE_OTHER): Payer: BC Managed Care – PPO | Admitting: Nurse Practitioner

## 2020-12-21 ENCOUNTER — Encounter: Payer: Self-pay | Admitting: Nurse Practitioner

## 2020-12-21 ENCOUNTER — Other Ambulatory Visit: Payer: Self-pay

## 2020-12-21 VITALS — BP 134/85 | HR 72 | Temp 98.1°F | Ht 65.0 in | Wt 199.1 lb

## 2020-12-21 DIAGNOSIS — G609 Hereditary and idiopathic neuropathy, unspecified: Secondary | ICD-10-CM

## 2020-12-21 DIAGNOSIS — I10 Essential (primary) hypertension: Secondary | ICD-10-CM | POA: Diagnosis not present

## 2020-12-21 DIAGNOSIS — E782 Mixed hyperlipidemia: Secondary | ICD-10-CM

## 2020-12-21 DIAGNOSIS — E1165 Type 2 diabetes mellitus with hyperglycemia: Secondary | ICD-10-CM

## 2020-12-21 DIAGNOSIS — Z6833 Body mass index (BMI) 33.0-33.9, adult: Secondary | ICD-10-CM

## 2020-12-21 LAB — POCT GLYCOSYLATED HEMOGLOBIN (HGB A1C): Hemoglobin A1C: 6.8 % — AB (ref 4.0–5.6)

## 2020-12-21 MED ORDER — OZEMPIC (2 MG/DOSE) 8 MG/3ML ~~LOC~~ SOPN: PEN_INJECTOR | SUBCUTANEOUS | 2 refills | Status: DC

## 2020-12-21 NOTE — Progress Notes (Signed)
Established Patient Office Visit  Subjective:  Patient ID: Francis Cross, male    DOB: 1955/08/22  Age: 65 y.o. MRN: [id number]  CC:  Chief Complaint  Patient presents with   Follow-up   Diabetes    HPI [name] presents for follow up of type 2 diabetes. Blood sugars are improving. His HgbA1c is 6.8 today, down from 7.6 at most recent visit. He states that there are time when the 49m of Ozempic does make is stomach hurt at times. He will take half a dose when it bothers him. This seems to do better. He is reporting some neuropathy in the feet. Describes this pain as a burning/hot type sensation. In addition to diabetes, he has had three surgeries on his lumbar spine. Unsure if neuropathy is coming from diabetes or from chronic back issues.  The patient states that he is having injection on his hand due to trigger finger abnormality. Does see hand specialist for this. Has been considering surgery for his on another finger in the past.  The patient has no other concerns or complaints today.  denies chest pain, chest pressure, or shortness of breath. He denies headaches or visual disturbances. He denies abdominal pain, nausea, vomiting, or changes in bowel or bladder habits.    Past Medical History:  Diagnosis Date   Acid reflux    Allergy    Arthritis    hands   [place]    skin cancer- basal cell on nose   Cataract    Chronic kidney disease    Kidney stones   Di[place]    Difficult intubation    Erectile dysfunction    Fatty liver    Gout    Hemochromatosis    Hiatal hernia    History of kidney stones    Hyperlipidemia    Hypertension    Iron overload    Obesity    Sciatica    bilateral, intermittent    Past Surgical History:  Procedure Laterality Date   APPENDECTOMY     BACK SURGERY  09/09/2017   lumbar discectomy    CARDIAC CATHETERIZATION     [facility]   CATARACT EXTRACTION W/PHACO Right 12/15/2018   Procedure: CATARACT  EXTRACTION PHACO AND INTRAOCULAR LENS PLACEMENT ([facility]) RIGHT DIABETIC 00:35.7  16.3%  5.81;  Surgeon: PBirder Robson MD;  [facility]  Service: Ophthalmology;  Laterality: Right;  diabetic - insulin   CATARACT EXTRACTION W/PHACO Left 01/05/2019   Procedure: CATARACT EXTRACTION PHACO AND INTRAOCULAR LENS PLACEMENT ([place] LEFT DIABETIC;  Surgeon: PBirder Robson MD;  [place]  Service: Ophthalmology;  Laterality: Left;  0:31 16.4% 5.20   CERVICAL FUSION     COLONOSCOPY WITH PROPOFOL N/A 12/22/2018   Procedure: COLONOSCOPY WITH PROPOFOL;  Surgeon: DDoran Stabler MD;  [facility];  Service: Gastroenterology;  Laterality: N/A;   KIDNEY STONE SURGERY  03/07/2017   laser blast   LUMBAR LAMINECTOMY/DECOMPRESSION MICRODISCECTOMY N/A 09/23/2017   Procedure: Lumbar Four-Five Redo Discectomy for epidural hematoma.;  Surgeon: EKristeen Miss MD;  [place]  Service: Neurosurgery;  Laterality: N/A;   TONSILLECTOMY      Family History  Problem Relation Age of Onset   Cancer Mother    Melanoma Mother    Lung cancer Father    Hyperlipidemia Sister    Hypertension Sister    Colon cancer Neg Hx    Stomach cancer Neg Hx    Rectal cancer Neg Hx  Esophageal cancer Neg Hx    Liver cancer Neg Hx     Social History   Socioeconomic History   Marital status: Married    Spouse name: Not on file   Number of children: 3   Years of education: Not on file   Highest education level: Not on file  Occupational History   Occupation: retired  Tobacco Use   Smoking status: Former   Smokeless tobacco: Former   Tobacco comments:    socially as teenager  Scientific laboratory technician Use: Never used  Substance and Sexual Activity   Alcohol use: Not Currently   Drug use: No   Sexual activity: Yes  Other Topics Concern   Not on file  Social History Narrative   Not on file   Social Determinants of Health   Financial Resource Strain: Not on file   Food Insecurity: Not on file  Transportation Needs: Not on file  Physical Activity: Not on file  Stress: Not on file  Social Connections: Not on file  Intimate Partner Violence: Not on file    Outpatient Medications Prior to Visit  Medication Sig Dispense Refill   acetaminophen (TYLENOL) 325 MG tablet Take 650 mg by mouth every 6 (six) hours as needed for mild pain or moderate pain.      blood glucose meter kit and supplies Dispense based on patient and insurance preference. Use up to 3 times daily as directed. Dispense with QS lancets, strips for 3 months, 3 refills. 1 each 0   clotrimazole (LOTRIMIN) 1 % cream Apply 1 application topically 2 (two) times daily. As needed to  affected area. 30 g 1   diclofenac Sodium (VOLTAREN) 1 % GEL Apply 4 g topically 4 (four) times daily. 100 g 2   Evolocumab (REPATHA SURECLICK) 973 MG/ML SOAJ Inject 140 mg into the skin every 14 (fourteen) days. 2 mL 11   ketoconazole (NIZORAL) 2 % shampoo APPLY TOPICALLY 2 (TWO) TIMES A WEEK. 120 mL 1   ketorolac (ACULAR) 0.5 % ophthalmic solution Place 1 drop into both eyes 2 (two) times daily as needed. 5 mL 1   levocetirizine (XYZAL ALLERGY 24HR) 5 MG tablet Take 1 tablet (5 mg total) by mouth every evening. 90 tablet 3   losartan-hydrochlorothiazide (HYZAAR) 100-25 MG tablet Take 1 tablet by mouth daily. 90 tablet 1   omeprazole (PRILOSEC) 40 MG capsule Take 40 mg by mouth daily.     tamsulosin (FLOMAX) 0.4 MG CAPS capsule Take 0.4 mg by mouth daily.     triamcinolone ointment (KENALOG) 0.1 % Apply 1 application topically 2 (two) times daily. 80 g 2   Semaglutide, 2 MG/DOSE, (OZEMPIC, 2 MG/DOSE,) 8 MG/3ML SOPN Inject 25m [place] once weekly for DM2 3 mL 2   No facility-administered medications prior to visit.    Allergies  Allergen Reactions   Praluent [Alirocumab] Other (See Comments)    headaches   Keflex [Cephalexin] Rash   Penicillins Itching, Rash and Other (See Comments)    Has patient had a PCN  reaction causing immediate rash, facial/tongue/throat swelling, SOB or lightheadedness with hypotension: No Has patient had a PCN reaction causing severe rash involving mucus membranes or skin necrosis: No Has patient had a PCN reaction that required hospitalization: No Has patient had a PCN reaction occurring within the last 10 years: No If all of the above answers are "NO", then may proceed with Cephalosporin use.  Tolerates Amoxicillin/Augmentin per patient report    ROS Review of Systems  Constitutional:  Negative for activity change, chills, fatigue and fever.  HENT:  Negative for congestion, postnasal drip, rhinorrhea, sinus pressure, sinus pain, sneezing and sore throat.   Eyes: Negative.   Respiratory:  Negative for cough, shortness of breath and wheezing.   Cardiovascular:  Negative for chest pain and palpitations.  Gastrointestinal:  Negative for constipation, diarrhea, nausea and vomiting.       Occasional abdominal pain which he associates with taking ozempic  Endocrine: Negative for cold intolerance, heat intolerance, polydipsia and polyuria.       Blood sugars doing well    Genitourinary:  Negative for dysuria, frequency and urgency.  Musculoskeletal:  Negative for back pain and myalgias.  Skin:  Negative for rash.  Allergic/Immunologic: Negative for environmental allergies.  Neurological:  Negative for dizziness, weakness and headaches.  Psychiatric/Behavioral:  The patient is not nervous/anxious.      Objective:    Physical Exam Vitals and nursing note reviewed.  Constitutional:      Appearance: Normal appearance. He is well-developed.  HENT:     Head: Normocephalic and atraumatic.     Nose: Nose normal.     Mouth/Throat:     Mouth: Mucous membranes are moist.  Eyes:     Extraocular Movements: Extraocular movements intact.     Conjunctiva/sclera: Conjunctivae normal.     Pupils: Pupils are equal, round, and reactive to light.  Cardiovascular:     Rate and  Rhythm: Normal rate and regular rhythm.     Pulses: Normal pulses.     Heart sounds: Normal heart sounds.  Pulmonary:     Effort: Pulmonary effort is normal.     Breath sounds: Normal breath sounds.  Abdominal:     Palpations: Abdomen is soft.  Musculoskeletal:        General: Normal range of motion.     Cervical back: Normal range of motion and neck supple.  Lymphadenopathy:     Cervical: No cervical adenopathy.  Skin:    General: Skin is warm and dry.     Capillary Refill: Capillary refill takes less than 2 seconds.  Neurological:     General: No focal deficit present.     Mental Status: He is alert and oriented to person, place, and time.  Psychiatric:        Mood and Affect: Mood normal.        Behavior: Behavior normal.        Thought Content: Thought content normal.        Judgment: Judgment normal.    Today's Vitals   12/21/20 0941  BP: 134/85  Pulse: 72  Temp: 98.1 F (36.7 C)  SpO2: 99%  Weight: 199 lb 1.6 oz (90.3 kg)  Height: '5\' 5"'  (1.651 m)   Body mass index is 33.13 kg/m.   Wt Readings from Last 3 Encounters:  12/21/20 199 lb 1.6 oz (90.3 kg)  12/05/20 200 lb (90.7 kg)  09/20/20 201 lb (91.2 kg)     Health Maintenance Due  Topic Date Due   COVID-19 Vaccine (1) Never done   Zoster Vaccines- Shingrix (1 of 2) Never done   Pneumococcal Vaccine 27-46 Years old (3 - PCV) 01/19/2021    There are no preventive care reminders to display for this patient.  Lab Results  Component Value Date   TSH 2.170 06/14/2019   Lab Results  Component Value Date   WBC 14.2 (H) 11/10/2019   HGB 12.6 (L) 11/10/2019   HCT 38.3 (L) 11/10/2019  MCV 95.0 11/10/2019   PLT 132 (L) 11/10/2019   Lab Results  Component Value Date   NA 137 09/05/2020   K 4.1 09/05/2020   CO2 26 09/05/2020   GLUCOSE 188 (H) 09/05/2020   BUN 20 09/05/2020   CREATININE 1.14 09/05/2020   BILITOT 0.5 09/05/2020   ALKPHOS 79 09/05/2020   AST 27 09/05/2020   ALT 29 09/05/2020   PROT  6.5 09/05/2020   ALBUMIN 4.7 09/05/2020   CALCIUM 9.8 09/05/2020   ANIONGAP 7 11/10/2019   EGFR 72 09/05/2020   GFR 83.58 05/26/2017   Lab Results  Component Value Date   CHOL 141 12/05/2020   Lab Results  Component Value Date   HDL 47 12/05/2020   Lab Results  Component Value Date   LDLCALC 66 12/05/2020   Lab Results  Component Value Date   TRIG 167 (H) 12/05/2020   Lab Results  Component Value Date   CHOLHDL 3.0 12/05/2020   Lab Results  Component Value Date   HGBA1C 6.8 (A) 12/21/2020      Assessment & Plan:  1. Type 2 diabetes mellitus with hyperglycemia, without long-term current use of insulin (HCC) Improving.  Hemoglobin A1c 6.8 today down from 7.6 recent visit.  Continue all diabetic medications as prescribed.  Renew Ozempic today.  May adjust dosing as symptoms allow. - POCT glycosylated hemoglobin (Hb A1C) - Semaglutide, 2 MG/DOSE, (OZEMPIC, 2 MG/DOSE,) 8 MG/3ML SOPN; Inject 19m [place] once weekly for DM2  Dispense: 9 mL; Refill: 2  2. Essential hypertension Stable.  Continue blood pressure medication as prescribed.   3. Mixed hyperlipidemia  Reviewed recent labs.  Lipid panel stable and within normal limits.  Continue to control through diet.  4. Idiopathic peripheral neuropathy Patient developing some peripheral neuropathy lower extremities.  This could be related to diabetes or multiple back surgeries in the past.  Discussed trial of gabapentin.  Patient would like to hold off on this for now.  We will reassess at next visit.  Problem List Items Addressed This Visit       Cardiovascular and Mediastinum   Essential hypertension     Endocrine   Type 2 diabetes mellitus with hyperglycemia, without long-term current use of insulin (HCC) - Primary   Relevant Medications   Semaglutide, 2 MG/DOSE, (OZEMPIC, 2 MG/DOSE,) 8 MG/3ML SOPN   Other Relevant Orders   POCT glycosylated hemoglobin (Hb A1C) (Completed)     Nervous and Auditory   Idiopathic  peripheral neuropathy     Other   Mixed hyperlipidemia    Meds ordered this encounter  Medications   Semaglutide, 2 MG/DOSE, (OZEMPIC, 2 MG/DOSE,) 8 MG/3ML SOPN    Sig: Inject 231mSC once weekly for DM2    Dispense:  9 mL    Refill:  2    Please fill as 90 day prescription    Order Specific Question:   Supervising Provider    Answer:   MEBeatrice Lecher [[id number]]     Follow-up: Return in about 3 months (around 03/23/2021) for diabetes with HgbA1c check.    HeRonnell FreshwaterNP  This note was dictated using DrSystems analystRapid proofreading was performed to expedite the delivery of the information. Despite proofreading, phonetic errors will occur which are common with this voice recognition software. Please take this into consideration. If there are any concerns, please contact our office.

## 2020-12-21 NOTE — Patient Instructions (Signed)

## 2021-01-16 ENCOUNTER — Other Ambulatory Visit: Payer: Self-pay | Admitting: Cardiovascular Disease

## 2021-01-16 NOTE — Telephone Encounter (Signed)
Please review

## 2021-02-25 ENCOUNTER — Other Ambulatory Visit: Payer: Self-pay | Admitting: Nurse Practitioner

## 2021-02-25 DIAGNOSIS — M5136 Other intervertebral disc degeneration, lumbar region: Secondary | ICD-10-CM

## 2021-02-25 DIAGNOSIS — L209 Atopic dermatitis, unspecified: Secondary | ICD-10-CM

## 2021-03-13 ENCOUNTER — Telehealth: Payer: Self-pay

## 2021-03-13 NOTE — Telephone Encounter (Signed)
Called and lmomed that we needed new insurance information since I called the pharmacy and they stated that they do not have any. I need it in order to process a pa for the repatha

## 2021-03-16 ENCOUNTER — Encounter: Payer: Self-pay | Admitting: Cardiovascular Disease

## 2021-03-22 DIAGNOSIS — D492 Neoplasm of unspecified behavior of bone, soft tissue, and skin: Secondary | ICD-10-CM | POA: Diagnosis not present

## 2021-03-22 DIAGNOSIS — Z85828 Personal history of other malignant neoplasm of skin: Secondary | ICD-10-CM | POA: Diagnosis not present

## 2021-03-22 DIAGNOSIS — L858 Other specified epidermal thickening: Secondary | ICD-10-CM | POA: Diagnosis not present

## 2021-03-27 ENCOUNTER — Other Ambulatory Visit: Payer: Self-pay

## 2021-03-27 ENCOUNTER — Ambulatory Visit (INDEPENDENT_AMBULATORY_CARE_PROVIDER_SITE_OTHER): Payer: HMO | Admitting: Nurse Practitioner

## 2021-03-27 ENCOUNTER — Encounter: Payer: Self-pay | Admitting: Nurse Practitioner

## 2021-03-27 ENCOUNTER — Other Ambulatory Visit: Payer: Self-pay | Admitting: Nurse Practitioner

## 2021-03-27 VITALS — BP 115/79 | HR 75 | Temp 97.6°F | Ht 65.0 in | Wt 199.4 lb

## 2021-03-27 DIAGNOSIS — E1165 Type 2 diabetes mellitus with hyperglycemia: Secondary | ICD-10-CM

## 2021-03-27 DIAGNOSIS — I10 Essential (primary) hypertension: Secondary | ICD-10-CM

## 2021-03-27 DIAGNOSIS — Z6833 Body mass index (BMI) 33.0-33.9, adult: Secondary | ICD-10-CM

## 2021-03-27 DIAGNOSIS — M6530 Trigger finger, unspecified finger: Secondary | ICD-10-CM

## 2021-03-27 DIAGNOSIS — E782 Mixed hyperlipidemia: Secondary | ICD-10-CM

## 2021-03-27 LAB — POCT GLYCOSYLATED HEMOGLOBIN (HGB A1C): Hemoglobin A1C: 8.2 % — AB (ref 4.0–5.6)

## 2021-03-27 MED ORDER — OZEMPIC (2 MG/DOSE) 8 MG/3ML ~~LOC~~ SOPN: PEN_INJECTOR | SUBCUTANEOUS | 2 refills | Status: DC

## 2021-03-27 NOTE — Progress Notes (Signed)
Established Patient Office Visit  Subjective:  Patient ID: Francis Cross, male    DOB: 07-26-1955  Age: 66 y.o. MRN: [id number]  CC:  Chief Complaint  Patient presents with   Diabetes    HPI Francis Cross presents for follow up visit. Believes that his blood sugars are doing ok. Did have steroid shots into both hands. And a steroid pack for trigger finger abnormalities in both hands. Has had to cut ozempic shot to 1/2 dose as full 30m dose would make him very sick to his stomach. Today, HgbA1c is 8.274mp from 6.8 at the last check.  Will have surgical repair of trigger finger on the right hand on 04/02/2021. . Marland Kitchene is concerned about the cholesterol medication may be contributing to the joint and tendon abnormalities. [name]  He has no other concerns or complaints at this time.    Past Medical History:  Diagnosis Date   Acid reflux    Allergy    Arthritis    hands   Cancer ([facility]   skin cancer- basal cell on nose   Cataract    Chronic kidney disease    Kidney stones   Diabetes mellitus without complication ([facility]   Difficult intubation    Erectile dysfunction    Fatty liver    Gout    Hemochromatosis    Hiatal hernia    History of kidney stones    Hyperlipidemia    Hypertension    Iron overload    Obesity    Sciatica    bilateral, intermittent    Past Surgical History:  Procedure Laterality Date   APPENDECTOMY     BACK SURGERY  09/09/2017   lumbar discectomy    CARDIAC CATHETERIZATION     [facility]   CATARACT EXTRACTION W/PHACO Right 12/15/2018   Procedure: CATARACT EXTRACTION PHACO AND INTRAOCULAR LENS PLACEMENT (IOC) RIGHT DIABETIC 00:35.7  16.3%  5.81;  Surgeon: PoBirder RobsonMD;  [facility];  Laterality: Right;  diabetic - insulin   CATARACT EXTRACTION W/PHACO Left 01/05/2019   Procedure: CATARACT EXTRACTION PHACO AND INTRAOCULAR LENS PLACEMENT ([place]ETIC;   Surgeon: PoBirder RobsonMD;  [place] [facility]: Ophthal[place];  Laterality: Left;  0:31 16.4% 5.20   CERVICAL FUSION     COLONOSCOPY WITH PROPOFOL N/A 12/22/2018   Procedure: COLONOSCOPY WITH PROPOFOL;  Surgeon: DaDoran StablerMD;  [facility];  Service: Gastroenterology;  Laterality: N/A;   KIDNEY STONE SURGERY  03/07/2017   laser blast   LUMBAR LAMINECTOMY/DECOMPRESSION MICRODISCECTOMY N/A 09/23/2017   Procedure: Lumbar Four-Five Redo Discectomy for epidural hematoma.;  Surgeon: ElKristeen MissMD;  [place] [facility] Service: Neurosurgery;  Laterality: N/A;   TONSILLECTOMY      Family History  Problem Relation Age of Onset   Cancer Mother    Melanoma Mother    Lung cancer Father    Hyperlipidemia Sister    Hypertension Sister    Colon cancer Neg Hx    Stomach cancer Neg Hx    Rectal cancer Neg Hx    Esophageal cancer Neg Hx    Liver cancer Neg Hx     Social History   Socioeconomic History   Marital status: Married    Spouse name: Not on file   Number of children: 3   Years of education: Not on file   Highest education level: Not on file  Occupational History  Occupation: retired  Tobacco Use   Smoking status: Former   Smokeless tobacco: Former   Tobacco comments:    socially as teenager  Scientific laboratory technician Use: Never used  Substance and Sexual Activity   Alcohol use: Not Currently   Drug use: No   Sexual activity: Yes  Other Topics Concern   Not on file  Social History Narrative   Not on file   Social Determinants of Health   Financial Resource Strain: Not on file  Food Insecurity: Not on file  Transportation Needs: Not on file  Physical Activity: Not on file  Stress: Not on file  Social Connections: Not on file  Intimate Partner Violence: Not on file    Outpatient Medications Prior to Visit  Medication Sig Dispense Refill   acetaminophen (TYLENOL) 325 MG tablet Take 650 mg by mouth every 6 (six) hours as  needed for mild pain or moderate pain.      blood glucose meter kit and supplies Dispense based on patient and insurance preference. Use up to 3 times daily as directed. Dispense with QS lancets, strips for 3 months, 3 refills. 1 each 0   clotrimazole (LOTRIMIN) 1 % cream Apply 1 application topically 2 (two) times daily. As needed to  affected area. 30 g 1   diclofenac Sodium (VOLTAREN) 1 % GEL APPLY 4 G TOPICALLY 4 TIMES DAILY 100 g 2   Evolocumab (REPATHA SURECLICK) 357 MG/ML SOAJ INJECT 140 MG INTO THE SKIN EVERY 14 (FOURTEEN) DAYS. 6 mL 3   ketoconazole (NIZORAL) 2 % shampoo APPLY TOPICALLY 2 (TWO) TIMES A WEEK. 120 mL 1   ketorolac (ACULAR) 0.5 % ophthalmic solution Place 1 drop into both eyes 2 (two) times daily as needed. 5 mL 1   levocetirizine (XYZAL ALLERGY 24HR) 5 MG tablet Take 1 tablet (5 mg total) by mouth every evening. 90 tablet 3   losartan-hydrochlorothiazide (HYZAAR) 100-25 MG tablet Take 1 tablet by mouth daily. 90 tablet 1   omeprazole (PRILOSEC) 40 MG capsule Take 40 mg by mouth daily.     tamsulosin (FLOMAX) 0.4 MG CAPS capsule Take 0.4 mg by mouth daily.     triamcinolone ointment (KENALOG) 0.1 % APPLY TO AFFECTED AREA TWICE A DAY 80 g 2   Semaglutide, 2 MG/DOSE, (OZEMPIC, 2 MG/DOSE,) 8 MG/3ML SOPN Inject 41m [place] once weekly for DM2 9 mL 2   No facility-administered medications prior to visit.    Allergies  Allergen Reactions   Praluent [Alirocumab] Other (See Comments)    headaches   Keflex [Cephalexin] Rash   Penicillins Itching, Rash and Other (See Comments)    Has patient had a PCN reaction causing immediate rash, facial/tongue/throat swelling, SOB or lightheadedness with hypotension: No Has patient had a PCN reaction causing severe rash involving mucus membranes or skin necrosis: No Has patient had a PCN reaction that required hospitalization: No Has patient had a PCN reaction occurring within the last 10 years: No If all of the above answers are "NO", then  may proceed with Cephalosporin use.  Tolerates Amoxicillin/Augmentin per patient report    ROS Review of Systems  Constitutional:  Negative for activity change, chills, fatigue and fever.  HENT:  Negative for congestion, postnasal drip, rhinorrhea, sinus pressure, sinus pain, sneezing and sore throat.   Eyes: Negative.   Respiratory:  Negative for cough, shortness of breath and wheezing.   Cardiovascular:  Negative for chest pain and palpitations.  Gastrointestinal:  Negative for constipation, diarrhea, nausea and  vomiting.  Endocrine: Negative for cold intolerance, heat intolerance, polydipsia and polyuria.       Elevated blood sugars recently due to steroids use from trigger fingers bilaterally   Genitourinary:  Negative for dysuria, frequency and urgency.  Musculoskeletal:  Positive for arthralgias, joint swelling and myalgias. Negative for back pain.       Mostly in the fingers of both hands   Skin:  Negative for rash.  Allergic/Immunologic: Negative for environmental allergies.  Neurological:  Negative for dizziness, weakness and headaches.  Psychiatric/Behavioral:  The patient is not nervous/anxious.      Objective:    Physical Exam Vitals and nursing note reviewed.  Constitutional:      Appearance: Normal appearance. He is well-developed.  HENT:     Head: Normocephalic and atraumatic.     Nose: Nose normal.     Mouth/Throat:     Mouth: Mucous membranes are moist.  Eyes:     Extraocular Movements: Extraocular movements intact.     Conjunctiva/sclera: Conjunctivae normal.     Pupils: Pupils are equal, round, and reactive to light.  Cardiovascular:     Rate and Rhythm: Normal rate and regular rhythm.     Pulses: Normal pulses.     Heart sounds: Normal heart sounds.  Pulmonary:     Effort: Pulmonary effort is normal.     Breath sounds: Normal breath sounds.  Abdominal:     Palpations: Abdomen is soft.  Musculoskeletal:        General: Normal range of motion.      Cervical back: Normal range of motion and neck supple.  Lymphadenopathy:     Cervical: No cervical adenopathy.  Skin:    General: Skin is warm and dry.     Capillary Refill: Capillary refill takes less than 2 seconds.  Neurological:     General: No focal deficit present.     Mental Status: He is alert and oriented to person, place, and time.  Psychiatric:        Mood and Affect: Mood normal.        Behavior: Behavior normal.        Thought Content: Thought content normal.        Judgment: Judgment normal.    Today's Vitals   03/27/21 0818  BP: 115/79  Pulse: 75  Temp: 97.6 F (36.4 C)  SpO2: 98%  Weight: 199 lb 6.4 oz (90.4 kg)  Height: '5\' 5"'  (1.651 m)   Body mass index is 33.18 kg/m.   Wt Readings from Last 3 Encounters:  03/27/21 199 lb 6.4 oz (90.4 kg)  12/21/20 199 lb 1.6 oz (90.3 kg)  12/05/20 200 lb (90.7 kg)     Health Maintenance Due  Topic Date Due   OPHTHALMOLOGY EXAM  08/31/2020   Pneumonia Vaccine 29+ Years old (3 - PCV) 01/19/2021   FOOT EXAM  02/06/2021    There are no preventive care reminders to display for this patient.  Lab Results  Component Value Date   TSH 2.170 06/14/2019   Lab Results  Component Value Date   WBC 14.2 (H) 11/10/2019   HGB 12.6 (L) 11/10/2019   HCT 38.3 (L) 11/10/2019   MCV 95.0 11/10/2019   PLT 132 (L) 11/10/2019   Lab Results  Component Value Date   NA 137 09/05/2020   K 4.1 09/05/2020   CO2 26 09/05/2020   GLUCOSE 188 (H) 09/05/2020   BUN 20 09/05/2020   CREATININE 1.14 09/05/2020   BILITOT 0.5 09/05/2020  ALKPHOS 79 09/05/2020   AST 27 09/05/2020   ALT 29 09/05/2020   PROT 6.5 09/05/2020   ALBUMIN 4.7 09/05/2020   CALCIUM 9.8 09/05/2020   ANIONGAP 7 11/10/2019   EGFR 72 09/05/2020   GFR 83.58 05/26/2017   Lab Results  Component Value Date   CHOL 141 12/05/2020   Lab Results  Component Value Date   HDL 47 12/05/2020   Lab Results  Component Value Date   LDLCALC 66 12/05/2020   Lab Results   Component Value Date   TRIG 167 (H) 12/05/2020   Lab Results  Component Value Date   CHOLHDL 3.0 12/05/2020   Lab Results  Component Value Date   HGBA1C 8.2 (A) 03/27/2021      Assessment & Plan:  1. Type 2 diabetes mellitus with hyperglycemia, without long-term current use of insulin (HCC) Hemoglobin A1c 8.2 today from 6.8 at last check.  Patient taking smaller doses of Ozempic.  Recommend he increase dose back to 2 mg weekly.  New prescription sent to his pharmacy today.  He should continue to limit intake of carbohydrates and sugar.  Encouraged him to check blood sugars daily, prior to eating.  The goal is to have sugars between 70 and 100. - POCT glycosylated hemoglobin (Hb A1C) - Semaglutide, 2 MG/DOSE, (OZEMPIC, 2 MG/DOSE,) 8 MG/3ML SOPN; Inject 33m [place] once weekly for DM2  Dispense: 9 mL; Refill: 2  2. Essential hypertension Stable.  Continue blood pressure medication as prescribed.  3. Mixed hyperlipidemia Patient should continue Repatha as prescribed.  Follow-up with cardiology as scheduled.  4. Body mass index (BMI) of 33.0-33.9 in adult Encourage patient to limit calorie intake to 2000 cal/day or less.  He should consume a low cholesterol, low-fat diet.  Patient should incorporate exercise into his daily routine.   5. Trigger finger, unspecified finger, unspecified laterality Continue regular visits with orthopedics as scheduled.   Problem List Items Addressed This Visit       Cardiovascular and Mediastinum   Essential hypertension     Endocrine   Type 2 diabetes mellitus with hyperglycemia, without long-term current use of insulin (HCC) - Primary   Relevant Medications   Semaglutide, 2 MG/DOSE, (OZEMPIC, 2 MG/DOSE,) 8 MG/3ML SOPN   Other Relevant Orders   POCT glycosylated hemoglobin (Hb A1C) (Completed)     Musculoskeletal and Integument   Trigger finger     Other   Mixed hyperlipidemia   Body mass index (BMI) of 33.0-33.9 in adult    Meds ordered  this encounter  Medications   Semaglutide, 2 MG/DOSE, (OZEMPIC, 2 MG/DOSE,) 8 MG/3ML SOPN    Sig: Inject 237mSC once weekly for DM2    Dispense:  9 mL    Refill:  2    Please fill as 90 day prescription    Order Specific Question:   Supervising Provider    Answer:   MEBeatrice Lecher [[id number]]    Follow-up: Return in about 3 months (around 06/25/2021) for diabetes with HgbA1c check.    HeRonnell FreshwaterNP  This note was dictated using [employer] analystRapid proofreading was performed to expedite the delivery of the information. Despite proofreading, phonetic errors will occur which are common with this voice recognition software. Please take this into consideration. If there are any concerns, please contact our office.

## 2021-03-27 NOTE — Progress Notes (Signed)
a1c

## 2021-03-28 ENCOUNTER — Other Ambulatory Visit: Payer: Self-pay | Admitting: Nurse Practitioner

## 2021-03-28 DIAGNOSIS — I10 Essential (primary) hypertension: Secondary | ICD-10-CM

## 2021-03-28 MED ORDER — LOSARTAN POTASSIUM-HCTZ 50-12.5 MG PO TABS: 1.0000 | ORAL_TABLET | Freq: Every day | ORAL | 1 refills | Status: DC

## 2021-03-29 ENCOUNTER — Encounter: Payer: Self-pay | Admitting: Nurse Practitioner

## 2021-03-29 LAB — HM DIABETES EYE EXAM

## 2021-04-02 DIAGNOSIS — M65341 Trigger finger, right ring finger: Secondary | ICD-10-CM | POA: Diagnosis not present

## 2021-04-02 DIAGNOSIS — M65321 Trigger finger, right index finger: Secondary | ICD-10-CM | POA: Diagnosis not present

## 2021-04-09 DIAGNOSIS — M65341 Trigger finger, right ring finger: Secondary | ICD-10-CM | POA: Diagnosis not present

## 2021-04-09 DIAGNOSIS — M65321 Trigger finger, right index finger: Secondary | ICD-10-CM | POA: Diagnosis not present

## 2021-04-09 DIAGNOSIS — Z4789 Encounter for other orthopedic aftercare: Secondary | ICD-10-CM | POA: Diagnosis not present

## 2021-04-09 DIAGNOSIS — M65332 Trigger finger, left middle finger: Secondary | ICD-10-CM | POA: Diagnosis not present

## 2021-04-09 DIAGNOSIS — M79644 Pain in right finger(s): Secondary | ICD-10-CM | POA: Diagnosis not present

## 2021-04-16 DIAGNOSIS — M25641 Stiffness of right hand, not elsewhere classified: Secondary | ICD-10-CM | POA: Diagnosis not present

## 2021-05-20 ENCOUNTER — Encounter: Payer: Self-pay | Admitting: Nurse Practitioner

## 2021-05-20 ENCOUNTER — Encounter: Payer: Self-pay | Admitting: Cardiovascular Disease

## 2021-05-21 ENCOUNTER — Telehealth: Payer: Self-pay

## 2021-05-21 DIAGNOSIS — R079 Chest pain, unspecified: Secondary | ICD-10-CM

## 2021-05-21 NOTE — Telephone Encounter (Signed)
Contacted patient in regards to his daughterss [employer].  ?Patient states this is normal for him, and it has not changed it is about the same. However he states he notices the sharp pain when he moves around, he can have the pain when he takes a deep breath and at times he can replicate the pain. He states it does come and go, he denies other symptoms (shortness of breath, swelling) he just wanted to make Dr.Arida aware and get his recommendations. Patient denies any medication changes, he was speaking well with me on the phone, I did advise I would route a message to [place] to advise.  ?Patient verbalized understanding, thankful for callback.  ?

## 2021-05-24 ENCOUNTER — Other Ambulatory Visit: Payer: Self-pay | Admitting: *Deleted

## 2021-05-24 DIAGNOSIS — R079 Chest pain, unspecified: Secondary | ICD-10-CM

## 2021-05-24 NOTE — Telephone Encounter (Signed)
Patient is agreeable to the [facility]. Orders have been placed. ? ?Your physician has requested that you have a lexiscan myoview. For further information please visit [email or web address]. Please follow instruction sheet, as given. This will take place at [place], [address] ? ?How to prepare for your Myocardial Perfusion Test: ?Do not eat or drink 3 hours prior to your test, except you may have water. ?Do not consume products containing caffeine (regular or decaffeinated) 12 hours prior to your test. (ex: coffee, chocolate, sodas, tea). ?Do bring a list of your current medications with you.  If not listed below, you may take your medications as normal. ?Do wear comfortable clothes (no dresses or overalls) and walking shoes, tennis shoes preferred (No heels or open toe shoes are allowed). ?Do NOT wear cologne, perfume, aftershave, or lotions (deodorant is allowed). ?The test will take approximately 3 to 4 hours to complete ?If these instructions are not followed, your test will have to be rescheduled. ? ?

## 2021-05-24 NOTE — Progress Notes (Signed)
Attestation for [employer] ?

## 2021-05-24 NOTE — Telephone Encounter (Signed)
The pain is atypical and likely noncardiac.  However, he has multiple risk factors and might be best to proceed with a Lexiscan Myoview for evaluation. ?

## 2021-05-28 NOTE — Telephone Encounter (Signed)
Francis Cross scheduled for 3/30 ?

## 2021-05-29 ENCOUNTER — Telehealth (HOSPITAL_COMMUNITY): Payer: Self-pay

## 2021-05-29 NOTE — Telephone Encounter (Signed)
Spoke with the patient, detailed instructions given. He stated that he would be here for his test. Asked to call back with any questions. S.Lyza Houseworth EMTP 

## 2021-05-31 ENCOUNTER — Ambulatory Visit (HOSPITAL_COMMUNITY): Payer: PPO | Attending: Cardiology

## 2021-05-31 DIAGNOSIS — R079 Chest pain, unspecified: Secondary | ICD-10-CM | POA: Insufficient documentation

## 2021-05-31 LAB — MYOCARDIAL PERFUSION IMAGING
LV dias vol: 84 mL (ref 62–150)
LV sys vol: 35 mL
Nuc Stress EF: 58 %
Peak HR: 103 {beats}/min
Rest HR: 71 {beats}/min
Rest Nuclear Isotope Dose: 10.2 mCi
SDS: 2
SRS: 0
SSS: 2
ST Depression (mm): 0 mm
Stress Nuclear Isotope Dose: 30.3 mCi
TID: 1.09

## 2021-05-31 MED ORDER — TECHNETIUM TC 99M TETROFOSMIN IV KIT
30.3000 | PACK | Freq: Once | INTRAVENOUS | Status: AC | PRN
Start: 2021-05-31 — End: 2021-05-31
  Administered 2021-05-31: 30.3 via INTRAVENOUS
  Filled 2021-05-31: qty 31

## 2021-05-31 MED ORDER — REGADENOSON 0.4 MG/5ML IV SOLN
0.4000 mg | Freq: Once | INTRAVENOUS | Status: AC
  Administered 2021-05-31: 0.4 mg via INTRAVENOUS

## 2021-05-31 MED ORDER — TECHNETIUM TC 99M TETROFOSMIN IV KIT
10.2000 | PACK | Freq: Once | INTRAVENOUS | Status: AC | PRN
Start: 2021-05-31 — End: 2021-05-31
  Administered 2021-05-31: 10.2 via INTRAVENOUS
  Filled 2021-05-31: qty 11

## 2021-06-26 ENCOUNTER — Ambulatory Visit (INDEPENDENT_AMBULATORY_CARE_PROVIDER_SITE_OTHER): Payer: HMO | Admitting: Nurse Practitioner

## 2021-06-26 ENCOUNTER — Encounter: Payer: Self-pay | Admitting: Nurse Practitioner

## 2021-06-26 VITALS — BP 139/87 | HR 74 | Temp 98.6°F | Ht 64.96 in | Wt 199.0 lb

## 2021-06-26 DIAGNOSIS — E1165 Type 2 diabetes mellitus with hyperglycemia: Secondary | ICD-10-CM

## 2021-06-26 DIAGNOSIS — J309 Allergic rhinitis, unspecified: Secondary | ICD-10-CM | POA: Diagnosis not present

## 2021-06-26 DIAGNOSIS — I10 Essential (primary) hypertension: Secondary | ICD-10-CM

## 2021-06-26 DIAGNOSIS — J0140 Acute pansinusitis, unspecified: Secondary | ICD-10-CM | POA: Insufficient documentation

## 2021-06-26 DIAGNOSIS — H1013 Acute atopic conjunctivitis, bilateral: Secondary | ICD-10-CM | POA: Diagnosis not present

## 2021-06-26 LAB — POCT GLYCOSYLATED HEMOGLOBIN (HGB A1C): Hemoglobin A1C: 8.3 % — AB (ref 4.0–5.6)

## 2021-06-26 MED ORDER — AMOXICILLIN-POT CLAVULANATE 875-125 MG PO TABS: 1.0000 | ORAL_TABLET | Freq: Two times a day (BID) | ORAL | 0 refills | Status: DC

## 2021-06-26 MED ORDER — AZELASTINE HCL 0.1 % NA SOLN: 2.0000 | Freq: Two times a day (BID) | NASAL | 1 refills | Status: DC

## 2021-06-26 MED ORDER — OLOPATADINE HCL 0.2 % OP SOLN: 1.0000 [drp] | Freq: Every day | OPHTHALMIC | 3 refills | Status: AC

## 2021-06-26 MED ORDER — FLUTICASONE PROPIONATE 50 MCG/ACT NA SUSP: 2.0000 | Freq: Every day | NASAL | 6 refills | Status: DC

## 2021-06-26 MED ORDER — MOUNJARO 7.5 MG/0.5ML ~~LOC~~ SOAJ: 7.5000 mg | SUBCUTANEOUS | 2 refills | Status: DC

## 2021-06-26 NOTE — Progress Notes (Signed)
Established patient visit ? ? ?Patient: Francis Cross   DOB: 30-Mar-1955   66 y.o. Male  MRN: [id number] ?Visit Date: 06/26/2021 ? ? ?Chief Complaint  ?Patient presents with  ? Follow-up  ?  dia  ? Diabetes  ? ?Subjective  ?  ?HPI ?HPI   ? ? Follow-up   ? Additional comments: dia ? ?  ?  ?Last edited by Vivia Birmingham, RMA on 06/26/2021  8:36 AM.  ?  ?  ?Follow up from diabetes.  ?--should be taking ozempic 34m weekly. Check HgbA1c today. Most recent check was 8.2 in 03/2021. Today, the HgbA1c is 8.3. he is now taking ozempic 2266monce weekly. Patient has had several cortisone injections due to trigger finger abnormalities in both hands. This may be contributing to increased blood sugar  ?-has seen orthopedics for trigger finger abnormalities . ?-dealing with severe allergies and sinus issues. Currently taking Xyzal. Had been using flonase nasal spray without relief. Getting stopped up every night to the point he is unable to breathe. Hard to rest. Starting to notis blood in nasal mucus. Eyes watery and feels like there is sand and grit in his eyes. ? ? ? ?Medications: ?Outpatient Medications Prior to Visit  ?Medication Sig  ? blood glucose meter kit and supplies Dispense based on patient and insurance preference. Use up to 3 times daily as directed. ?Dispense with QS lancets, strips for 3 months, 3 refills.  ? clotrimazole (LOTRIMIN) 1 % cream Apply 1 application topically 2 (two) times daily. As needed to  affected area.  ? diclofenac Sodium (VOLTAREN) 1 % GEL APPLY 4 G TOPICALLY 4 TIMES DAILY  ? Evolocumab (REPATHA SURECLICK) 14573G/ML SOAJ INJECT 140 MG INTO THE SKIN EVERY 14 (FOURTEEN) DAYS.  ? ketoconazole (NIZORAL) 2 % shampoo APPLY TOPICALLY 2 (TWO) TIMES A WEEK.  ? ketorolac (ACULAR) 0.5 % ophthalmic solution Place 1 drop into both eyes 2 (two) times daily as needed.  ? levocetirizine (XYZAL ALLERGY 24HR) 5 MG tablet Take 1 tablet (5 mg total) by mouth every evening.  ? losartan-hydrochlorothiazide (HYZAAR)  50-12.5 MG tablet Take 1 tablet by mouth daily.  ? omeprazole (PRILOSEC) 40 MG capsule Take 40 mg by mouth daily.  ? Semaglutide, 2 MG/DOSE, (OZEMPIC, 2 MG/DOSE,) 8 MG/3ML SOPN Inject 66m20mC once weekly for DM2  ? tamsulosin (FLOMAX) 0.4 MG CAPS capsule Take 0.4 mg by mouth daily.  ? triamcinolone ointment (KENALOG) 0.1 % APPLY TO AFFECTED AREA TWICE A DAY  ? [DISCONTINUED] acetaminophen (TYLENOL) 325 MG tablet Take 650 mg by mouth every 6 (six) hours as needed for mild pain or moderate pain.   ? ?No facility-administered medications prior to visit.  ? ? ?Review of Systems  ?Constitutional:  Negative for activity change, chills, fatigue and fever.  ?HENT:  Positive for congestion, postnasal drip, rhinorrhea, sinus pressure and sneezing. Negative for sinus pain and sore throat.   ?Eyes:  Positive for discharge, redness and itching.  ?     Excess tearing   ?Respiratory:  Positive for cough. Negative for shortness of breath and wheezing.   ?Cardiovascular:  Negative for chest pain and palpitations.  ?Gastrointestinal:  Negative for constipation, diarrhea, nausea and vomiting.  ?Endocrine: Negative for cold intolerance, heat intolerance, polydipsia and polyuria.  ?     Blood sugars unchanged despite increased dose of ozempic  ?Genitourinary:  Negative for dysuria, frequency and urgency.  ?Musculoskeletal:  Positive for arthralgias and myalgias. Negative for back pain.  ?Skin:  Negative for rash.  ?  Allergic/Immunologic: Positive for environmental allergies.  ?Neurological:  Negative for dizziness, weakness and headaches.  ?Psychiatric/Behavioral:  The patient is not nervous/anxious.   ? ? ? Objective  ?  ? ?Today's Vitals  ? 06/26/21 7564 06/26/21 0915  ?BP: (!) 143/89 139/87  ?Pulse: 74   ?Temp: 98.6 ?F (37 ?C)   ?SpO2: 98%   ?Weight: 199 lb (90.3 kg)   ?Height: 5' 4.96" (1.65 m)   ? ?Body mass index is 33.16 kg/m?.  ? ?BP Readings from Last 3 Encounters:  ?06/26/21 139/87  ?03/27/21 115/79  ?12/21/20 134/85  ?  ?Wt  Readings from Last 3 Encounters:  ?06/26/21 199 lb (90.3 kg)  ?05/31/21 199 lb (90.3 kg)  ?03/27/21 199 lb 6.4 oz (90.4 kg)  ?  ?Physical Exam ?Vitals and nursing note reviewed.  ?Constitutional:   ?   Appearance: Normal appearance. He is well-developed.  ?HENT:  ?   Head: Normocephalic and atraumatic.  ?   Right Ear: Tympanic membrane, ear canal and external ear normal.  ?   Left Ear: Tympanic membrane, ear canal and external ear normal.  ?   Nose: Congestion present.  ?   Mouth/Throat:  ?   Mouth: Mucous membranes are moist.  ?   Pharynx: Oropharynx is clear.  ?Eyes:  ?   Pupils: Pupils are equal, round, and reactive to light.  ?   Comments: Redness and irritation ofsclera and conjunctiva of both eyes   ?Cardiovascular:  ?   Rate and Rhythm: Normal rate and regular rhythm.  ?   Pulses: Normal pulses.  ?   Heart sounds: Normal heart sounds.  ?Pulmonary:  ?   Effort: Pulmonary effort is normal.  ?   Breath sounds: Normal breath sounds.  ?Abdominal:  ?   Palpations: Abdomen is soft.  ?Musculoskeletal:     ?   General: Normal range of motion.  ?   Cervical back: Normal range of motion and neck supple.  ?Lymphadenopathy:  ?   Cervical: No cervical adenopathy.  ?Skin: ?   General: Skin is warm and dry.  ?   Capillary Refill: Capillary refill takes less than 2 seconds.  ?Neurological:  ?   General: No focal deficit present.  ?   Mental Status: He is alert and oriented to person, place, and time.  ?Psychiatric:     ?   Mood and Affect: Mood normal.     ?   Behavior: Behavior normal.     ?   Thought Content: Thought content normal.     ?   Judgment: Judgment normal.  ?  ? ? ?Results for orders placed or performed in visit on 06/26/21  ?POCT glycosylated hemoglobin (Hb A1C)  ?Result Value Ref Range  ? Hemoglobin A1C 8.3 (A) 4.0 - 5.6 %  ? HbA1c POC (<> result, manual entry)    ? HbA1c, POC (prediabetic range)    ? HbA1c, POC (controlled diabetic range)    ? ? Assessment & Plan  ?  ?1. Type 2 diabetes mellitus with  hyperglycemia, without long-term current use of insulin ([place]) ?HgbA1c 8.3 today. This is up 0.1point since last check. Will change ozempic to mounjaro 7.53m weekly. Continue to monitor blood sugars consistently. The goal is to have fasting blood sugars between 70 and 100.  ?- POCT glycosylated hemoglobin (Hb A1C) ?- tirzepatide (MOUNJARO) 7.5 MG/0.5ML Pen; Inject 7.5 mg into the skin once a week.  Dispense: 2 mL; Refill: 2 ? ?2. Allergic conjunctivitis of both eyes ?  Add pataday eye drops. Insert 1 drop into both eyes daily  ?- Olopatadine HCl 0.2 % SOLN; Apply 1 drop to eye daily.  Dispense: 2.5 mL; Refill: 3 ? ?3. Acute non-recurrent pansinusitis ?Treat wit haugmentin 819m twice daily for 10 days. Continue with oral antihistamine treatment as before  ?- amoxicillin-clavulanate (AUGMENTIN) 875-125 MG tablet; Take 1 tablet by mouth 2 (two) times daily.  Dispense: 20 tablet; Refill: 0 ? ?4. Chronic allergic rhinitis ?Add astelin nasal spray. He should use 2 sprays into both nostrils twice daily. Continue flonase as previously prescribed . ?- azelastine (ASTELIN) 0.1 % nasal spray; Place 2 sprays into both nostrils 2 (two) times daily. Use in each nostril as directed  Dispense: 30 mL; Refill: 1 ? ?5. Essential hypertension ?Generally stable. Continue BP medication as prescribed.  ? ?  ?Problem List Items Addressed This Visit   ? ?  ? Cardiovascular and Mediastinum  ? Essential hypertension  ?  ? Respiratory  ? Acute non-recurrent pansinusitis  ? Relevant Medications  ? amoxicillin-clavulanate (AUGMENTIN) 875-125 MG tablet  ? azelastine (ASTELIN) 0.1 % nasal spray  ? Chronic allergic rhinitis  ? Relevant Medications  ? azelastine (ASTELIN) 0.1 % nasal spray  ?  ? Endocrine  ? Type 2 diabetes mellitus with hyperglycemia, without long-term current use of insulin (HCC) - Primary  ? Relevant Medications  ? tirzepatide (MOUNJARO) 7.5 MG/0.5ML Pen  ? Other Relevant Orders  ? POCT glycosylated hemoglobin (Hb A1C) (Completed)   ?  ? Other  ? Allergic conjunctivitis of both eyes  ? Relevant Medications  ? Olopatadine HCl 0.2 % SOLN  ?  ? ?Return in about 3 months (around 09/25/2021) for medicare wellness, FBW a week prior to visit with checkc

## 2021-07-09 DIAGNOSIS — M65332 Trigger finger, left middle finger: Secondary | ICD-10-CM | POA: Diagnosis not present

## 2021-07-18 ENCOUNTER — Other Ambulatory Visit: Payer: Self-pay | Admitting: Nurse Practitioner

## 2021-07-18 DIAGNOSIS — J309 Allergic rhinitis, unspecified: Secondary | ICD-10-CM

## 2021-07-26 ENCOUNTER — Other Ambulatory Visit (INDEPENDENT_AMBULATORY_CARE_PROVIDER_SITE_OTHER): Payer: Self-pay | Admitting: Vascular Surgery

## 2021-07-26 DIAGNOSIS — I6523 Occlusion and stenosis of bilateral carotid arteries: Secondary | ICD-10-CM

## 2021-07-27 ENCOUNTER — Ambulatory Visit (INDEPENDENT_AMBULATORY_CARE_PROVIDER_SITE_OTHER): Payer: PPO | Admitting: Vascular Surgery

## 2021-07-27 ENCOUNTER — Encounter: Payer: Self-pay | Admitting: Nurse Practitioner

## 2021-07-27 ENCOUNTER — Ambulatory Visit (INDEPENDENT_AMBULATORY_CARE_PROVIDER_SITE_OTHER): Payer: BC Managed Care – PPO

## 2021-07-27 ENCOUNTER — Other Ambulatory Visit: Payer: Self-pay | Admitting: Nurse Practitioner

## 2021-07-27 ENCOUNTER — Encounter (INDEPENDENT_AMBULATORY_CARE_PROVIDER_SITE_OTHER): Payer: Self-pay | Admitting: Vascular Surgery

## 2021-07-27 VITALS — BP 142/84 | HR 69 | Resp 16 | Ht 65.0 in | Wt 200.0 lb

## 2021-07-27 DIAGNOSIS — I10 Essential (primary) hypertension: Secondary | ICD-10-CM | POA: Diagnosis not present

## 2021-07-27 DIAGNOSIS — E7800 Pure hypercholesterolemia, unspecified: Secondary | ICD-10-CM

## 2021-07-27 DIAGNOSIS — I6523 Occlusion and stenosis of bilateral carotid arteries: Secondary | ICD-10-CM

## 2021-07-27 DIAGNOSIS — E1165 Type 2 diabetes mellitus with hyperglycemia: Secondary | ICD-10-CM | POA: Diagnosis not present

## 2021-07-27 NOTE — Assessment & Plan Note (Signed)
Carotid duplex today shows no progression with mild 1 to 39% ICA stenosis bilaterally and actually on the lower end of that range.  We will continue to follow this every other year with duplex.  He is on Repatha and if he can tolerate and the price on this is not excessive, that would certainly help slow progression of disease.

## 2021-07-27 NOTE — Assessment & Plan Note (Signed)
blood glucose control important in reducing the progression of atherosclerotic disease. Also, involved in wound healing. On appropriate medications.  

## 2021-07-27 NOTE — Progress Notes (Signed)
MRN : [id number]  Francis Cross is a 66 y.o. (01-19-1956) male who presents with chief complaint of No chief complaint on file. Marland Kitchen  History of Present Illness: Patient returns in follow-up of his carotid disease.  He is doing well.  He is on Repatha for his hyperlipidemia with intolerance to statins.  He has had no focal neurologic symptoms. Specifically, the patient denies amaurosis fugax, speech or swallowing difficulties, or arm or leg weakness or numbness.  Carotid duplex today shows continued very mild 1 to 39% ICA stenosis bilaterally on the lower end of that range without progression from previous studies.  Current Outpatient Medications  Medication Sig Dispense Refill   azelastine (ASTELIN) 0.1 % nasal spray Place 2 sprays into both nostrils 2 (two) times daily. Use in each nostril as directed 30 mL 1   blood glucose meter kit and supplies Dispense based on patient and insurance preference. Use up to 3 times daily as directed. Dispense with QS lancets, strips for 3 months, 3 refills. 1 each 0   diclofenac Sodium (VOLTAREN) 1 % GEL APPLY 4 G TOPICALLY 4 TIMES DAILY 100 g 2   Evolocumab (REPATHA SURECLICK) 088 MG/ML SOAJ INJECT 140 MG INTO THE SKIN EVERY 14 (FOURTEEN) DAYS. 6 mL 3   fluticasone (FLONASE) 50 MCG/ACT nasal spray Place 2 sprays into both nostrils daily. 16 g 6   ketoconazole (NIZORAL) 2 % shampoo APPLY TOPICALLY 2 (TWO) TIMES A WEEK. 120 mL 1   ketorolac (ACULAR) 0.5 % ophthalmic solution Place 1 drop into both eyes 2 (two) times daily as needed. 5 mL 1   levocetirizine (XYZAL ALLERGY 24HR) 5 MG tablet Take 1 tablet (5 mg total) by mouth every evening. 90 tablet 3   losartan-hydrochlorothiazide (HYZAAR) 50-12.5 MG tablet Take 1 tablet by mouth daily. 90 tablet 1   omeprazole (PRILOSEC) 40 MG capsule Take 40 mg by mouth daily.     tamsulosin (FLOMAX) 0.4 MG CAPS capsule Take 0.4 mg by mouth daily.     triamcinolone ointment (KENALOG) 0.1 % APPLY TO AFFECTED AREA TWICE A DAY  80 g 2   amoxicillin-clavulanate (AUGMENTIN) 875-125 MG tablet Take 1 tablet by mouth 2 (two) times daily. (Patient not taking: Reported on 07/27/2021) 20 tablet 0   clotrimazole (LOTRIMIN) 1 % cream Apply 1 application topically 2 (two) times daily. As needed to  affected area. (Patient not taking: Reported on 07/27/2021) 30 g 1   Olopatadine HCl 0.2 % SOLN Apply 1 drop to eye daily. (Patient not taking: Reported on 07/27/2021) 2.5 mL 3   Semaglutide, 2 MG/DOSE, (OZEMPIC, 2 MG/DOSE,) 8 MG/3ML SOPN Inject 56m [place] once weekly for DM2 (Patient not taking: Reported on 07/27/2021) 9 mL 2   tirzepatide (MOUNJARO) 7.5 MG/0.5ML Pen Inject 7.5 mg into the skin once a week. (Patient not taking: Reported on 07/27/2021) 2 mL 2   No current facility-administered medications for this visit.    Past Medical History:  Diagnosis Date   Acid reflux    Allergy    Arthritis    hands   Cancer ([place]    skin cancer- basal cell on nose   Cataract    Chronic kidney disease    Kidney stones   Diabetes mellitus without complication ([facility]    Difficult intubation    Erectile dysfunction    Fatty liver    Gout    Hemochromatosis    Hiatal hernia    History of kidney stones    Hyperlipidemia  Hypertension    Iron overload    Obesity    Sciatica    bilateral, intermittent    Past Surgical History:  Procedure Laterality Date   APPENDECTOMY     BACK SURGERY  09/09/2017   lumbar discectomy    CARDIAC CATHETERIZATION     [facility]   CATARACT EXTRACTION W/PHACO Right 12/15/2018   Procedure: CATARACT EXTRACTION PHACO AND INTRAOCULAR LENS PLACEMENT (IOC) RIGHT DIABETIC 00:35.7  16.3%  5.81;  Surgeon: Birder Robson, MD;  Location: [facility];  Service: Ophthalmology;  Laterality: Right;  diabetic - insulin   CATARACT EXTRACTION W/PHACO Left 01/05/2019   Procedure: CATARACT EXTRACTION PHACO AND INTRAOCULAR LENS PLACEMENT (House) LEFT DIABETIC;  Surgeon: Birder Robson, MD;  Location: [facility];   Service: Ophthalmology;  Laterality: Left;  0:31 16.4% 5.20   CERVICAL FUSION     COLONOSCOPY WITH PROPOFOL N/A 12/22/2018   Procedure: COLONOSCOPY WITH PROPOFOL;  Surgeon: Doran Stabler, MD;  [facility];  Service: Gastroenterology;  Laterality: N/A;   KIDNEY STONE SURGERY  03/07/2017   laser blast   LUMBAR LAMINECTOMY/DECOMPRESSION MICRODISCECTOMY N/A 09/23/2017   Procedure: Lumbar Four-Five Redo Discectomy for epidural hematoma.;  Surgeon: Kristeen Miss, MD;  Location: [facility];  Service: Neurosurgery;  Laterality: N/A;   TONSILLECTOMY       Social History   Tobacco Use   Smoking status: Former   Smokeless tobacco: Former   Tobacco comments:    socially as teenager  Scientific laboratory technician Use: Never used  Substance Use Topics   Alcohol use: Not Currently   Drug use: No       Family History  Problem Relation Age of Onset   Cancer Mother    Melanoma Mother    Lung cancer Father    Hyperlipidemia Sister    Hypertension Sister    Colon cancer Neg Hx    Stomach cancer Neg Hx    Rectal cancer Neg Hx    Esophageal cancer Neg Hx    Liver cancer Neg Hx      Allergies  Allergen Reactions   Praluent [Alirocumab] Other (See Comments)    headaches     REVIEW OF SYSTEMS (Negative unless checked)  Constitutional: '[]' Weight loss  '[]' Fever  '[]' Chills Cardiac: '[]' Chest pain   '[]' Chest pressure   '[]' Palpitations   '[]' Shortness of breath when laying flat   '[]' Shortness of breath at rest   '[]' Shortness of breath with exertion. Vascular:  '[]' Pain in legs with walking   '[]' Pain in legs at rest   '[]' Pain in legs when laying flat   '[]' Claudication   '[]' Pain in feet when walking  '[]' Pain in feet at rest  '[]' Pain in feet when laying flat   '[]' History of DVT   '[]' Phlebitis   '[]' Swelling in legs   '[]' Varicose veins   '[]' Non-healing ulcers Pulmonary:   '[]' Uses home oxygen   '[]' Productive cough   '[]' Hemoptysis   '[]' Wheeze  '[]' COPD   '[]' Asthma Neurologic:  '[]' Dizziness  '[]' Blackouts   '[]' Seizures   '[]' History  of stroke   '[]' History of TIA  '[]' Aphasia   '[]' Temporary blindness   '[]' Dysphagia   '[]' Weakness or numbness in arms   '[]' Weakness or numbness in legs Musculoskeletal:  '[x]' Arthritis   '[]' Joint swelling   '[]' Joint pain   '[]' Low back pain Hematologic:  '[]' Easy bruising  '[]' Easy bleeding   '[]' Hypercoagulable state   '[]' Anemic  '[]' Hepatitis Gastrointestinal:  '[]' Blood in stool   '[]' Vomiting blood  '[]' Gastroesophageal reflux/heartburn   '[]' Difficulty swallowing. Genitourinary:  '[]'   Chronic kidney disease   '[]' Difficult urination  '[]' Frequent urination  '[]' Burning with urination   '[]' Blood in urine Skin:  '[]' Rashes   '[]' Ulcers   '[]' Wounds Psychological:  '[]' History of anxiety   '[]'  History of major depression.  Physical Examination  Vitals:   07/27/21 1113  BP: (!) 142/84  Pulse: 69  Resp: 16  Weight: 200 lb (90.7 kg)  Height: '5\' 5"'  (1.651 m)   Body mass index is 33.28 kg/m. Gen:  WD/WN, NAD Head: [place]/AT, No temporalis wasting. Ear/Nose/Throat: Hearing grossly intact, nares w/o erythema or drainage, trachea midline Eyes: Conjunctiva clear. Sclera non-icteric Neck: Supple.  No bruit  Pulmonary:  Good air movement, equal and clear to auscultation bilaterally.  Cardiac: RRR, No JVD Vascular:  Vessel Right Left  Radial Palpable Palpable       Musculoskeletal: M/S 5/5 throughout.  No deformity or atrophy. No edema. Neurologic: CN 2-12 intact. Sensation grossly intact in extremities.  Symmetrical.  Speech is fluent. Motor exam as listed above. Psychiatric: Judgment intact, Mood & affect appropriate for pt's clinical situation. Dermatologic: No rashes or ulcers noted.  No cellulitis or open wounds.     CBC Lab Results  Component Value Date   WBC 14.2 (H) 11/10/2019   HGB 12.6 (L) 11/10/2019   HCT 38.3 (L) 11/10/2019   MCV 95.0 11/10/2019   PLT 132 (L) 11/10/2019    BMET    Component Value Date/Time   NA 137 09/05/2020 0857   K 4.1 09/05/2020 0857   CL 97 09/05/2020 0857   CO2 26 09/05/2020 0857    GLUCOSE 188 (H) 09/05/2020 0857   GLUCOSE 189 (H) 11/10/2019 0316   BUN 20 09/05/2020 0857   CREATININE 1.14 09/05/2020 0857   CREATININE 0.98 09/21/2015 1052   CALCIUM 9.8 09/05/2020 0857   GFRNONAA 83 02/07/2020 1139   GFRNONAA 84 09/21/2015 1052   GFRAA 96 02/07/2020 1139   GFRAA >89 09/21/2015 1052   CrCl cannot be calculated (Patient's most recent lab result is older than the maximum 21 days allowed.).  COAG No results found for: INR, PROTIME  Radiology No results found.   Assessment/Plan Essential hypertension blood pressure control important in reducing the progression of atherosclerotic disease. On appropriate oral medications.     Hypercholesteremia lipid control important in reducing the progression of atherosclerotic disease.  On Repatha, but apparently may become cost limiting in the future  Type 2 diabetes mellitus with hyperglycemia, without long-term current use of insulin (HCC) blood glucose control important in reducing the progression of atherosclerotic disease. Also, involved in wound healing. On appropriate medications.   Carotid stenosis Carotid duplex today shows no progression with mild 1 to 39% ICA stenosis bilaterally and actually on the lower end of that range.  We will continue to follow this every other year with duplex.  He is on Repatha and if he can tolerate and the price on this is not excessive, that would certainly help slow progression of disease.    Leotis Pain, MD  07/27/2021 11:57 AM    This note was created with Dragon medical transcription system.  Any errors from dictation are purely unintentional

## 2021-07-31 ENCOUNTER — Telehealth: Payer: Self-pay

## 2021-07-31 NOTE — Telephone Encounter (Signed)
Error

## 2021-07-31 NOTE — Telephone Encounter (Signed)
Here, even though they say prior auth not required, prior authorization has been denied.

## 2021-07-31 NOTE — Telephone Encounter (Signed)
But it was denied anyway. Can you call the pharmacy and tell them I want to start the mounjaro because the ozempic has not made a positive change in his HgbA1c. Maybe they think I want him to take both, which is not the case.

## 2021-08-01 ENCOUNTER — Other Ambulatory Visit: Payer: Self-pay | Admitting: Nurse Practitioner

## 2021-08-01 DIAGNOSIS — E1165 Type 2 diabetes mellitus with hyperglycemia: Secondary | ICD-10-CM

## 2021-08-01 MED ORDER — MOUNJARO 7.5 MG/0.5ML ~~LOC~~ SOAJ: 7.5000 mg | SUBCUTANEOUS | 2 refills | Status: DC

## 2021-08-01 NOTE — Telephone Encounter (Signed)
Called pt he is advised of Rx that was sent to pharmacy

## 2021-08-01 NOTE — Telephone Encounter (Signed)
Sent new prescription for mounjaro 7.'5mg'$  weekly to [facility] [address].

## 2021-08-01 NOTE — Telephone Encounter (Signed)
[facility] [place] [facility] [employer]

## 2021-08-01 NOTE — Progress Notes (Signed)
Sent new prescription for mounjaro 7.'5mg'$  weekly [facility] [address].

## 2021-08-29 ENCOUNTER — Other Ambulatory Visit: Payer: Self-pay | Admitting: Nurse Practitioner

## 2021-08-29 DIAGNOSIS — J309 Allergic rhinitis, unspecified: Secondary | ICD-10-CM

## 2021-09-03 ENCOUNTER — Other Ambulatory Visit: Payer: Self-pay | Admitting: Family Medicine

## 2021-09-03 DIAGNOSIS — J309 Allergic rhinitis, unspecified: Secondary | ICD-10-CM

## 2021-09-18 ENCOUNTER — Other Ambulatory Visit: Payer: Self-pay | Admitting: Nurse Practitioner

## 2021-09-18 ENCOUNTER — Other Ambulatory Visit: Payer: HMO

## 2021-09-18 DIAGNOSIS — I10 Essential (primary) hypertension: Secondary | ICD-10-CM

## 2021-09-25 ENCOUNTER — Encounter: Payer: HMO | Admitting: Nurse Practitioner

## 2021-10-01 ENCOUNTER — Telehealth: Payer: Self-pay | Admitting: Pharmacist Clinician (PhC)/ Clinical Pharmacy Specialist

## 2021-10-01 NOTE — Telephone Encounter (Signed)
Repatha approved to 7.31.24  Key [id number]

## 2021-11-02 ENCOUNTER — Other Ambulatory Visit: Payer: Self-pay | Admitting: Nurse Practitioner

## 2021-11-02 DIAGNOSIS — E1165 Type 2 diabetes mellitus with hyperglycemia: Secondary | ICD-10-CM

## 2021-12-22 ENCOUNTER — Other Ambulatory Visit: Payer: Self-pay | Admitting: Nurse Practitioner

## 2021-12-22 DIAGNOSIS — J309 Allergic rhinitis, unspecified: Secondary | ICD-10-CM

## 2021-12-31 ENCOUNTER — Encounter (INDEPENDENT_AMBULATORY_CARE_PROVIDER_SITE_OTHER): Payer: Self-pay

## 2022-01-22 DIAGNOSIS — B351 Tinea unguium: Secondary | ICD-10-CM | POA: Diagnosis not present

## 2022-01-22 DIAGNOSIS — L814 Other melanin hyperpigmentation: Secondary | ICD-10-CM | POA: Diagnosis not present

## 2022-01-22 DIAGNOSIS — L821 Other seborrheic keratosis: Secondary | ICD-10-CM | POA: Diagnosis not present

## 2022-01-22 DIAGNOSIS — L570 Actinic keratosis: Secondary | ICD-10-CM | POA: Diagnosis not present

## 2022-01-22 DIAGNOSIS — D229 Melanocytic nevi, unspecified: Secondary | ICD-10-CM | POA: Diagnosis not present

## 2022-01-22 DIAGNOSIS — Z85828 Personal history of other malignant neoplasm of skin: Secondary | ICD-10-CM | POA: Diagnosis not present

## 2022-01-29 ENCOUNTER — Encounter: Payer: Self-pay | Admitting: *Deleted

## 2022-01-29 NOTE — Progress Notes (Signed)
[facility] Quality Team Note  Name: Francis Cross Date of Birth: 09/26/1955 MRN: [id number] Date: 01/29/2022  [facility] Quality Team has reviewed this patient's chart, please see recommendations below:  [facility] Quality Other; (Pt has open gap for blood pressure.  Would need bp taken and compliant before end of 2023 to close gap.)

## 2022-01-30 DIAGNOSIS — M25512 Pain in left shoulder: Secondary | ICD-10-CM | POA: Diagnosis not present

## 2022-03-13 DIAGNOSIS — N411 Chronic prostatitis: Secondary | ICD-10-CM | POA: Diagnosis not present

## 2022-03-13 DIAGNOSIS — R350 Frequency of micturition: Secondary | ICD-10-CM | POA: Diagnosis not present

## 2022-03-13 DIAGNOSIS — R1084 Generalized abdominal pain: Secondary | ICD-10-CM | POA: Diagnosis not present

## 2022-03-13 DIAGNOSIS — Z125 Encounter for screening for malignant neoplasm of prostate: Secondary | ICD-10-CM | POA: Diagnosis not present

## 2022-03-13 DIAGNOSIS — R109 Unspecified abdominal pain: Secondary | ICD-10-CM | POA: Diagnosis not present

## 2022-03-13 DIAGNOSIS — N401 Enlarged prostate with lower urinary tract symptoms: Secondary | ICD-10-CM | POA: Diagnosis not present

## 2022-03-21 ENCOUNTER — Other Ambulatory Visit: Payer: Self-pay | Admitting: Nurse Practitioner

## 2022-03-21 DIAGNOSIS — I10 Essential (primary) hypertension: Secondary | ICD-10-CM

## 2022-04-04 ENCOUNTER — Other Ambulatory Visit: Payer: Self-pay | Admitting: Nurse Practitioner

## 2022-04-04 DIAGNOSIS — L570 Actinic keratosis: Secondary | ICD-10-CM | POA: Diagnosis not present

## 2022-04-04 DIAGNOSIS — I10 Essential (primary) hypertension: Secondary | ICD-10-CM

## 2022-04-09 ENCOUNTER — Ambulatory Visit: Payer: PPO | Attending: Cardiovascular Disease | Admitting: Cardiovascular Disease

## 2022-04-09 ENCOUNTER — Encounter: Payer: Self-pay | Admitting: Cardiovascular Disease

## 2022-04-09 VITALS — BP 138/86 | HR 70 | Ht 65.0 in | Wt 196.0 lb

## 2022-04-09 DIAGNOSIS — E785 Hyperlipidemia, unspecified: Secondary | ICD-10-CM

## 2022-04-09 DIAGNOSIS — I10 Essential (primary) hypertension: Secondary | ICD-10-CM | POA: Diagnosis not present

## 2022-04-09 DIAGNOSIS — I2510 Atherosclerotic heart disease of native coronary artery without angina pectoris: Secondary | ICD-10-CM | POA: Diagnosis not present

## 2022-04-09 NOTE — Progress Notes (Unsigned)
Cardiology Office Note   Date:  04/09/2022   ID:  Fawaz, Borquez [birth year], MRN [id number]  PCP:  Ronnell Freshwater, NP  Cardiologist:   Kathlyn Sacramento, MD   Chief Complaint  Patient presents with   Follow-up    12 months.       History of Present Illness: Francis Cross is a 67 y.o. male who is here today for regarding severe hyperlipidemia and mild LAD plaque noted on cardiac CTA done in 2019. He has known history of hypertension, hyperlipidemia and type 2 diabetes.  Previous nuclear stress test in 2015 was normal.  He has known history of intermittent atypical left-sided chest pains thought to be musculoskeletal. He has known history of severe hyperlipidemia with intolerance to statins and Zetia.    CTA of the coronary arteries in August 2019 showed a calcium score of 3 with mild soft plaque in the proximal LAD. Echocardiogram in March 2021 showed normal LV systolic function, grade 1 diastolic dysfunction and no significant valvular abnormalities. He had an ABI done in May 2021 which was normal and carotid Doppler showed mild nonobstructive disease. He had back surgery in 2021 .  He has been doing well with no chest pain, shortness of breath or palpitations.  No leg pain with walking.  He continues to take Repatha regularly.   Past Medical History:  Diagnosis Date   Acid reflux    Allergy    Arthritis    hands   Cancer ([place])    skin cancer- basal cell on nose   Cataract    Chronic kidney disease    Kidney stones   Diabetes mellitus without complication ([place])    Difficult intubation    Erectile dysfunction    Fatty liver    Gout    Hemochromatosis    Hiatal hernia    History of kidney stones    Hyperlipidemia    Hypertension    Iron overload    Obesity    Sciatica    bilateral, intermittent    Past Surgical History:  Procedure Laterality Date   APPENDECTOMY     BACK SURGERY  09/09/2017   lumbar discectomy    CARDIAC CATHETERIZATION     MC   CATARACT  EXTRACTION W/PHACO Right 12/15/2018   Procedure: CATARACT EXTRACTION PHACO AND INTRAOCULAR LENS PLACEMENT (IOC) RIGHT DIABETIC 00:35.7  16.3%  5.81;  Surgeon: Birder Robson, MD;  Location: [facility];  Service: Ophthalmology;  Laterality: Right;  diabetic - insulin   CATARACT EXTRACTION W/PHACO Left 01/05/2019   Procedure: CATARACT EXTRACTION PHACO AND INTRAOCULAR LENS PLACEMENT ([place]) LEFT DIABETIC;  Surgeon: Birder Robson, MD;  Location: [facility];  Service: Ophthalmology;  Laterality: Left;  0:31 16.4% 5.20   CERVICAL FUSION     COLONOSCOPY WITH PROPOFOL N/A 12/22/2018   Procedure: COLONOSCOPY WITH PROPOFOL;  Surgeon: Doran Stabler, MD;  [facility];  Service: Gastroenterology;  Laterality: N/A;   KIDNEY STONE SURGERY  03/07/2017   laser blast   LUMBAR LAMINECTOMY/DECOMPRESSION MICRODISCECTOMY N/A 09/23/2017   Procedure: Lumbar Four-Five Redo Discectomy for epidural hematoma.;  Surgeon: Kristeen Miss, MD;  Location: [facility];  Service: Neurosurgery;  Laterality: N/A;   TONSILLECTOMY       Current Outpatient Medications  Medication Sig Dispense Refill   Azelastine HCl 137 MCG/SPRAY SOLN PLACE 2 SPRAYS INTO BOTH NOSTRILS 2 (TWO) TIMES DAILY. USE IN EACH NOSTRIL AS DIRECTED 30 mL 3   clotrimazole (LOTRIMIN) 1 % cream Apply  1 application topically 2 (two) times daily. As needed to  affected area. 30 g 1   Evolocumab (REPATHA SURECLICK) 664 MG/ML SOAJ INJECT 140 MG INTO THE SKIN EVERY 14 (FOURTEEN) DAYS. 6 mL 3   fluticasone (FLONASE) 50 MCG/ACT nasal spray SPRAY 2 SPRAYS INTO EACH NOSTRIL EVERY DAY 48 mL 3   ketoconazole (NIZORAL) 2 % shampoo APPLY TOPICALLY 2 (TWO) TIMES A WEEK. 120 mL 1   ketorolac (ACULAR) 0.5 % ophthalmic solution Place 1 drop into both eyes 2 (two) times daily as needed. 5 mL 1   levocetirizine (XYZAL) 5 MG tablet TAKE 1 TABLET BY MOUTH EVERY DAY IN THE EVENING 90 tablet 3   losartan-hydrochlorothiazide (HYZAAR) 50-12.5 MG tablet  TAKE 1 TABLET BY MOUTH EVERY DAY 60 tablet 0   Olopatadine HCl 0.2 % SOLN Apply 1 drop to eye daily. 2.5 mL 3   omeprazole (PRILOSEC) 40 MG capsule Take 40 mg by mouth daily.     Semaglutide, 2 MG/DOSE, (OZEMPIC, 2 MG/DOSE,) 8 MG/3ML SOPN INJECT '2MG'$  UNDER THE SKIN ONCE WEEKLY FOR DIABETES - **patient needs to be seen in the office for additional refills.** 9 mL 0   tamsulosin (FLOMAX) 0.4 MG CAPS capsule Take 0.4 mg by mouth daily.     triamcinolone ointment (KENALOG) 0.1 % APPLY TO AFFECTED AREA TWICE A DAY 80 g 2   No current facility-administered medications for this visit.    Allergies:   Praluent [alirocumab]    Social History:  The patient  reports that he has quit smoking. He has quit using smokeless tobacco. He reports that he does not currently use alcohol. He reports that he does not use drugs.   Family History:  The patient's family history includes Cancer in his mother; Hyperlipidemia in his sister; Hypertension in his sister; Lung cancer in his father; Melanoma in his mother.    ROS:  Please see the history of present illness.   Otherwise, review of systems are positive for none.   All other systems are reviewed and negative.    PHYSICAL EXAM: VS:  BP 138/86 (BP Location: Right Arm, Patient Position: Sitting, Cuff Size: Normal)   Pulse 70   Ht '5\' 5"'$  (1.651 m)   Wt 196 lb (88.9 kg)   BMI 32.62 kg/m  , BMI Body mass index is 32.62 kg/m. GEN: Well nourished, well developed, in no acute distress  HEENT: normal  Neck: no JVD, carotid bruits, or masses Cardiac: RRR; no murmurs, rubs, or gallops,no edema  Respiratory:  clear to auscultation bilaterally, normal work of breathing GI: soft, nontender, nondistended, + BS MS: no deformity or atrophy  Skin: warm and dry, no rash Neuro:  Strength and sensation are intact Psych: euthymic mood, full affect   EKG:  EKG is ordered today. EKG showed normal sinus rhythm with nonspecific T wave changes.   Recent Labs: No results  found for requested labs within last 365 days.    Lipid Panel    Component Value Date/Time   CHOL 141 12/05/2020 0950   TRIG 167 (H) 12/05/2020 0950   HDL 47 12/05/2020 0950   CHOLHDL 3.0 12/05/2020 0950   CHOLHDL 2.6 11/28/2016 0839   VLDL 29 07/16/2016 1034   LDLCALC 66 12/05/2020 0950   LDLCALC 52 11/28/2016 0839   LDLDIRECT 152.1 10/12/2008 0726      Wt Readings from Last 3 Encounters:  04/09/22 196 lb (88.9 kg)  07/27/21 200 lb (90.7 kg)  06/26/21 199 lb (90.3 kg)  No data to display            ASSESSMENT AND PLAN:  1. Mild coronary atherosclerosis: Noted on previous CTA with no obstructive disease.  Continue treatment of risk factors.  Echocardiogram showed normal LV systolic function.  No anginal symptoms at the present time.  2. Severe hyperlipidemia: Most recent lipid profile showed an LDL of 66 on Repatha which will be continued.  He is due for repeat lipid profile which was ordered today.  3. Essential hypertension: Blood pressure is well controlled on current medication.  4.  Type 2 diabetes: He is due for hemoglobin A1c which was requested today and will be forwarded to his primary care physician.  He reports improvement in blood sugar control on Ozempic.   Disposition:   FU with me in 12 months.  Signed,  Kathlyn Sacramento, MD  04/09/2022 11:55 AM    [place]

## 2022-04-09 NOTE — Patient Instructions (Signed)
Medication Instructions:  We will call you about switching from [place] to [facility]. *If you need a refill on your cardiac medications before your next appointment, please call your pharmacy*   Lab Work: Your provider would like for you to have the following labs today: CMET and Lipid  If you have labs (blood work) drawn today and your tests are completely normal, you will receive your results only by: [place] (if you have MyChart) OR A paper copy in the mail If you have any lab test that is abnormal or we need to change your treatment, we will call you to review the results.   Testing/Procedures: None ordered   Follow-Up: At [facility], you and your health needs are our priority.  As part of our continuing mission to provide you with exceptional heart care, we have created designated Provider Care Teams.  These Care Teams include your primary Cardiologist (physician) and Advanced Practice Providers (APPs -  Physician Assistants and Nurse Practitioners) who all work together to provide you with the care you need, when you need it.  We recommend signing up for the patient portal called "MyChart".  Sign up information is provided on this After Visit Summary.  MyChart is used to connect with patients for Virtual Visits (Telemedicine).  Patients are able to view lab/test results, encounter notes, upcoming appointments, etc.  Non-urgent messages can be sent to your provider as well.   To learn more about what you can do with MyChart, go to [email or web address].    Your next appointment:   12 month(s)  Provider:   Kathlyn Sacramento, MD     Other Ins

## 2022-04-10 LAB — LIPID PANEL
Chol/HDL Ratio: 3.1 ratio (ref 0.0–5.0)
Cholesterol, Total: 178 mg/dL (ref 100–199)
HDL: 58 mg/dL (ref >39)
LDL Chol Calc (NIH): 95 mg/dL (ref 0–99)
Triglycerides: 141 mg/dL (ref 0–149)
VLDL Cholesterol Cal: 25 mg/dL (ref 5–40)

## 2022-04-10 LAB — COMPREHENSIVE METABOLIC PANEL
ALT: 39 IU/L (ref 0–44)
AST: 24 IU/L (ref 0–40)
Albumin/Globulin Ratio: 2.4 — ABNORMAL HIGH (ref 1.2–2.2)
Albumin: 5 g/dL — ABNORMAL HIGH (ref 3.9–4.9)
Alkaline Phosphatase: 95 IU/L (ref 44–121)
BUN/Creatinine Ratio: 15 (ref 10–24)
BUN: 16 mg/dL (ref 8–27)
Bilirubin Total: 0.6 mg/dL (ref 0.0–1.2)
CO2: 24 mmol/L (ref 20–29)
Calcium: 10.2 mg/dL (ref 8.6–10.2)
Chloride: 94 mmol/L — ABNORMAL LOW (ref 96–106)
Creatinine, Ser: 1.08 mg/dL (ref 0.76–1.27)
Globulin, Total: 2.1 g/dL (ref 1.5–4.5)
Glucose: 268 mg/dL — ABNORMAL HIGH (ref 70–99)
Potassium: 4.1 mmol/L (ref 3.5–5.2)
Sodium: 137 mmol/L (ref 134–144)
Total Protein: 7.1 g/dL (ref 6.0–8.5)
eGFR: 76 mL/min/{1.73_m2} (ref >59)

## 2022-04-12 ENCOUNTER — Other Ambulatory Visit: Payer: Self-pay | Admitting: *Deleted

## 2022-04-12 DIAGNOSIS — E785 Hyperlipidemia, unspecified: Secondary | ICD-10-CM

## 2022-04-15 ENCOUNTER — Telehealth: Payer: PPO | Admitting: Emergency Medicine

## 2022-04-15 DIAGNOSIS — R059 Cough, unspecified: Secondary | ICD-10-CM

## 2022-04-15 NOTE — Progress Notes (Signed)
Because you have multiple symptoms that are not consistent with a sinus infection, I feel your condition warrants further evaluation and I recommend that you be seen in a face to face visit. You can choose a video visit with Virtual Urgent Care or an in person visit at an Urgent Care. You can get a Virtual Urgent Care visit through [place] or through the [place] website: [email or web address]    NOTE: There will be NO CHARGE for this eVisit   If you are having a true medical emergency please call 911.      For an urgent face to face visit, [place] has eight urgent care centers for your convenience:   NEW!! [facility] [place] at [place] Get Driving Directions [id number] [address] [place], [place] [facility] [place] at [place] Get Driving Directions [id number] [address] [place], [place] [address]   [place] [facility] [place] [facility]) Get Driving Directions [id number] [address], [place] [address]  [place] [facility] [place] ([place]) Get Driving Directions [id number] [address] [place],  [place]  [address]  [place] [facility] - at [address] [place] Get Driving Directions  [phone] [address] [place],  [place] [address]   [place] [facility] at [facility] Get Driving Directions [id number] [place], [place], [place] [address]   [place] at [facility] Get Driving Directions  [id number] [address] [place],  [address]   [place] [facility] at [place] Get Driving Directions [id number] [address] [place],  [address]  Your MyChart E-visit questionnaire answers were reviewed by a board certified advanced clinical practitioner to complete  your personal care plan based on your specific symptoms.  Thank you for using e-Visits.

## 2022-04-22 DIAGNOSIS — D3132 Benign neoplasm of left choroid: Secondary | ICD-10-CM | POA: Diagnosis not present

## 2022-04-22 DIAGNOSIS — E119 Type 2 diabetes mellitus without complications: Secondary | ICD-10-CM | POA: Diagnosis not present

## 2022-04-22 DIAGNOSIS — H43813 Vitreous degeneration, bilateral: Secondary | ICD-10-CM | POA: Diagnosis not present

## 2022-04-22 LAB — HM DIABETES EYE EXAM

## 2022-04-30 ENCOUNTER — Encounter: Payer: Self-pay | Admitting: *Deleted

## 2022-05-03 ENCOUNTER — Encounter: Payer: Self-pay | Admitting: Nurse Practitioner

## 2022-05-30 DIAGNOSIS — L570 Actinic keratosis: Secondary | ICD-10-CM | POA: Diagnosis not present

## 2022-06-03 ENCOUNTER — Other Ambulatory Visit: Payer: Self-pay | Admitting: Nurse Practitioner

## 2022-06-03 DIAGNOSIS — J309 Allergic rhinitis, unspecified: Secondary | ICD-10-CM

## 2022-06-23 ENCOUNTER — Other Ambulatory Visit: Payer: Self-pay | Admitting: Nurse Practitioner

## 2022-06-23 DIAGNOSIS — I10 Essential (primary) hypertension: Secondary | ICD-10-CM

## 2022-07-24 ENCOUNTER — Encounter: Payer: Self-pay | Admitting: Cardiovascular Disease

## 2022-07-30 DIAGNOSIS — L309 Dermatitis, unspecified: Secondary | ICD-10-CM | POA: Diagnosis not present

## 2022-07-30 DIAGNOSIS — L570 Actinic keratosis: Secondary | ICD-10-CM | POA: Diagnosis not present

## 2022-07-30 DIAGNOSIS — Z85828 Personal history of other malignant neoplasm of skin: Secondary | ICD-10-CM | POA: Diagnosis not present

## 2022-07-30 DIAGNOSIS — L219 Seborrheic dermatitis, unspecified: Secondary | ICD-10-CM | POA: Diagnosis not present

## 2022-08-05 ENCOUNTER — Telehealth: Payer: Self-pay | Admitting: Pharmacist

## 2022-08-05 NOTE — Telephone Encounter (Signed)
Sucessfully enrolled patient in [employer] for Repatha. Patient should give the following information to pharmacy to process copay.  CARD NO. [id number]   BIN [id number]   PCN [id number]   PC GROUP [id number]

## 2022-08-15 ENCOUNTER — Other Ambulatory Visit: Payer: Self-pay | Admitting: Nurse Practitioner

## 2022-08-15 DIAGNOSIS — I10 Essential (primary) hypertension: Secondary | ICD-10-CM

## 2022-08-28 ENCOUNTER — Other Ambulatory Visit: Payer: Self-pay | Admitting: Pharmacist

## 2022-08-28 MED ORDER — REPATHA SURECLICK 140 MG/ML ~~LOC~~ SOAJ: 140.0000 mg | SUBCUTANEOUS | 3 refills | Status: DC

## 2022-10-08 NOTE — Progress Notes (Signed)
[facility] Quality Team Note  Name: Francis Cross Date of Birth: [birth year] MRN: [id number] Date: 10/08/2022  [facility] Quality Team has reviewed this patient's chart, please see recommendations below:  Provider Office to Schedule Appointment For (Reason Type); Patient requests provider office to schedule patient appointment for (A1C lab). Please call patient back with appointment details.

## 2022-10-15 ENCOUNTER — Ambulatory Visit: Payer: PPO | Admitting: Family Medicine

## 2022-10-30 DIAGNOSIS — H9313 Tinnitus, bilateral: Secondary | ICD-10-CM | POA: Diagnosis not present

## 2022-10-30 DIAGNOSIS — H90A22 Sensorineural hearing loss, unilateral, left ear, with restricted hearing on the contralateral side: Secondary | ICD-10-CM | POA: Diagnosis not present

## 2022-11-05 ENCOUNTER — Encounter: Payer: Self-pay | Admitting: Cardiovascular Disease

## 2022-11-05 DIAGNOSIS — I10 Essential (primary) hypertension: Secondary | ICD-10-CM

## 2022-11-07 MED ORDER — LOSARTAN POTASSIUM-HCTZ 50-12.5 MG PO TABS
1.0000 | ORAL_TABLET | Freq: Every day | ORAL | 1 refills | Status: AC
Start: 2022-11-07 — End: ?

## 2022-11-28 ENCOUNTER — Telehealth: Payer: Self-pay

## 2022-11-28 NOTE — Telephone Encounter (Signed)
Care everywhere search

## 2022-12-08 ENCOUNTER — Encounter: Payer: Self-pay | Admitting: Pharmacist

## 2022-12-08 NOTE — Progress Notes (Signed)
Pharmacy Quality Measure Review  This patient is appearing on a report for being at risk of failing the adherence measure for hypertension (ACEi/ARB) medications this calendar year.   Medication: losartan-hydrochlorothiazide 50-12.5 mg Last fill date: 9/5 for 90 day supply  Insurance report was not up to date. No action needed at this time.   Jarrett Ables, PharmD PGY-1 Pharmacy Resident

## 2022-12-16 ENCOUNTER — Telehealth: Payer: Self-pay

## 2022-12-16 NOTE — Telephone Encounter (Signed)
error 

## 2023-02-04 DIAGNOSIS — L814 Other melanin hyperpigmentation: Secondary | ICD-10-CM | POA: Diagnosis not present

## 2023-02-04 DIAGNOSIS — D229 Melanocytic nevi, unspecified: Secondary | ICD-10-CM | POA: Diagnosis not present

## 2023-02-04 DIAGNOSIS — Z85828 Personal history of other malignant neoplasm of skin: Secondary | ICD-10-CM | POA: Diagnosis not present

## 2023-02-04 DIAGNOSIS — L309 Dermatitis, unspecified: Secondary | ICD-10-CM | POA: Diagnosis not present

## 2023-02-04 DIAGNOSIS — L219 Seborrheic dermatitis, unspecified: Secondary | ICD-10-CM | POA: Diagnosis not present

## 2023-02-04 DIAGNOSIS — L570 Actinic keratosis: Secondary | ICD-10-CM | POA: Diagnosis not present

## 2023-02-04 DIAGNOSIS — L821 Other seborrheic keratosis: Secondary | ICD-10-CM | POA: Diagnosis not present

## 2023-05-19 ENCOUNTER — Ambulatory Visit: Payer: PPO | Admitting: Neurology

## 2023-05-19 ENCOUNTER — Telehealth: Payer: Self-pay | Admitting: *Deleted

## 2023-05-19 ENCOUNTER — Encounter: Payer: Self-pay | Admitting: Neurology

## 2023-05-19 VITALS — BP 142/85 | HR 85 | Ht 65.5 in | Wt 201.2 lb

## 2023-05-19 DIAGNOSIS — R51 Headache with orthostatic component, not elsewhere classified: Secondary | ICD-10-CM

## 2023-05-19 DIAGNOSIS — H9193 Unspecified hearing loss, bilateral: Secondary | ICD-10-CM

## 2023-05-19 DIAGNOSIS — G4734 Idiopathic sleep related nonobstructive alveolar hypoventilation: Secondary | ICD-10-CM

## 2023-05-19 DIAGNOSIS — R519 Headache, unspecified: Secondary | ICD-10-CM | POA: Diagnosis not present

## 2023-05-19 DIAGNOSIS — H9313 Tinnitus, bilateral: Secondary | ICD-10-CM | POA: Diagnosis not present

## 2023-05-19 DIAGNOSIS — R7989 Other specified abnormal findings of blood chemistry: Secondary | ICD-10-CM | POA: Diagnosis not present

## 2023-05-19 NOTE — Addendum Note (Signed)
 Addended by: Naomie Dean B on: 05/19/2023 01:26 PM   Modules accepted: Level of Service

## 2023-05-19 NOTE — Telephone Encounter (Signed)
 Sue Lush, RN; Kathyrn Sheriff; Santina Evans; Jeris Penta, [place]; 1 other Received, Thank you

## 2023-05-19 NOTE — Patient Instructions (Signed)
 Consider sleep evaluation MRI of the brain w/wo contrast Encourage hearing aids Send an overnight oxygen monitor One blood work today

## 2023-05-19 NOTE — Telephone Encounter (Signed)
ONO order sent to [facility].  

## 2023-05-19 NOTE — Progress Notes (Signed)
 [facility]    Provider:  Dr Lucia Gaskins Requesting Provider: Stephanie Cross* Primary Care Provider:  Sandre Kitty, MD  CC:  headaches and tinnitus  HPI:  Francis Cross is a 67 y.o. male here as requested by Francis Cross, F* for headaches and hearing loss. has Hypercholesteremia; GOUT, UNSPECIFIED; Disorder of iron metabolism; Obesity; Essential hypertension; Diaphragmatic hernia; ERECTILE DYSFUNCTION, ORGANIC; Pain in the chest; Testosterone deficiency; Hx of carpal tunnel repair; Herniated nucleus pulposus, L4-5 left; Difficult intubation; Special screening for malignant neoplasms, colon; Basal cell carcinoma of neck; Basal cell carcinoma of temple region; Cough; Degeneration of lumbar intervertebral disc; Paresthesia; Prostatitis; Pain in limb; Headache; Carotid stenosis; Spondylolisthesis; Encounter to establish care; Atopic dermatitis; Non-traumatic mid back pain; Type 2 diabetes mellitus with hyperglycemia, without long-term current use of insulin (HCC); Mixed hyperlipidemia; Idiopathic peripheral neuropathy; Body mass index (BMI) of 33.0-33.9 in adult; Trigger finger; Allergic conjunctivitis of both eyes; Acute non-recurrent pansinusitis; and Chronic allergic rhinitis on their problem list.  Here with [place] in the past, he has ringing gin the ear left > right.  They recommended hearing loss. Discussed hearing loss and dementia and recommended he does get the hearing aids. His hearing is poor , he can read lips, it has gotten bad, wife reports much information. Ongoing for at least 3 years. 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 Wife states he snores He does not want a sleep study I offered an ONO, wife said he woke up with a bad headache the other day and wakes up with headaches, he wakes up with dry mouth, but e denies excessive daytime fatigue. He has morning headaches, dry mouth. Ongoing for years.  Reviewed notes, labs and imaging from  outside physicians, which showed:  Medications tried that can be used in headache/migraine management: Tylenol, aspirin, Flexeril, Voltaren gel, Decadron, gabapentin, ibuprofen, ketorolac injections, lisinopril, losartan, meloxicam, Robaxin, Reglan, metoprolol, Zofran, prednisone, pregabalin,topamaz  03/25/2023: BUN 24, Creatinine 1.2 Review of Systems: Patient complains of symptoms per HPI as well as the following symptoms none. Pertinent negatives and positives per HPI. All others negative.   Social History   Socioeconomic History   Marital status: Married    Spouse name: Not on file   Number of children: 3   Years of education: Not on file   Highest education level: Not on file  Occupational History   Occupation: retired  Tobacco Use   Smoking status: Former   Smokeless tobacco: Former   Tobacco comments:    socially as teenager  Advertising account planner   Vaping status: Never Used  Substance and Sexual Activity   Alcohol use: Not Currently   Drug use: No   Sexual activity: Yes  Other Topics Concern   Not on file  Social History Narrative   Caffiene coffe 1 cup, occa 1 cap in afternoon   Live with wife and dog (bull dog- Cookie)   Working retired 2014.    Social Drivers of Corporate investment banker Strain: Not on file  Food Insecurity: Not on file  Transportation Needs: Not on file  Physical Activity: Not on file  Stress: Not on file  Social Connections: Unknown (07/15/2021)   Received from [facility], [facility]   Social Network    Social Network: Not on file  Intimate Partner Violence: Unknown (06/06/2021)   Received from [facility], [facility]   HITS    Physically Hurt: Not on file  Insult or Talk Down To: Not on file    Threaten Physical Harm: Not on file    Scream or Curse: Not on file    Family History  Problem Relation Age of Onset   Cancer Mother    Melanoma Mother    Lung cancer Father    Hyperlipidemia Sister    Hypertension Sister    Colon  cancer Neg Hx    Stomach cancer Neg Hx    Rectal cancer Neg Hx    Esophageal cancer Neg Hx    Liver cancer Neg Hx     Past Medical History:  Diagnosis Date   Acid reflux    Allergy    Arthritis    hands   Cancer (HCC)    skin cancer- basal cell on nose   Cataract    Chronic kidney disease    Kidney stones   Diabetes mellitus without complication (HCC)    Difficult intubation    Erectile dysfunction    Fatty liver    Gout    Hemochromatosis    Hiatal hernia    History of kidney stones    Hyperlipidemia    Hypertension    Iron overload    Obesity    Sciatica    bilateral, intermittent    Patient Active Problem List   Diagnosis Date Noted   Allergic conjunctivitis of both eyes 06/26/2021   Acute non-recurrent pansinusitis 06/26/2021   Chronic allergic rhinitis 06/26/2021   Body mass index (BMI) of 33.0-33.9 in adult 03/27/2021   Trigger finger 03/27/2021   Idiopathic peripheral neuropathy 12/21/2020   Encounter to establish care 10/06/2020   Atopic dermatitis 10/06/2020   Non-traumatic mid back pain 10/06/2020   Type 2 diabetes mellitus with hyperglycemia, without long-term current use of insulin (HCC) 10/06/2020   Mixed hyperlipidemia 10/06/2020   Spondylolisthesis 11/09/2019   Pain in limb 07/27/2019   Headache 07/27/2019   Carotid stenosis 07/27/2019   Special screening for malignant neoplasms, colon    Difficult intubation    Prostatitis 06/03/2018   Paresthesia 03/11/2018   Cough 03/04/2018   Basal cell carcinoma of neck 12/02/2017   Basal cell carcinoma of temple region 12/02/2017   Herniated nucleus pulposus, L4-5 left 09/23/2017   Degeneration of lumbar intervertebral disc 06/26/2017   Hx of carpal tunnel repair 12/22/2015   Testosterone deficiency 05/23/2014   Pain in the chest 10/01/2013   GOUT, UNSPECIFIED 01/29/2010   Obesity 12/06/2008   Disorder of iron metabolism 08/11/2007   Diaphragmatic hernia 07/08/2006   Hypercholesteremia 06/12/2006    Essential hypertension 06/12/2006   ERECTILE DYSFUNCTION, 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    Past Surgical History:  Procedure Laterality Date   APPENDECTOMY     BACK SURGERY  09/09/2017   lumbar discectomy    CARDIAC CATHETERIZATION     [facility]   CATARACT EXTRACTION W/PHACO Right 12/15/2018   Procedure: CATARACT EXTRACTION PHACO AND INTRAOCULAR LENS PLACEMENT (IOC) RIGHT DIABETIC 00:35.7  16.3%  5.81;  Surgeon: Galen Manila, MD;  Location: [facility];  Service: Ophthalmology;  Laterality: Right;  diabetic - insulin   CATARACT EXTRACTION W/PHACO Left 01/05/2019   Procedure: CATARACT EXTRACTION PHACO AND INTRAOCULAR LENS PLACEMENT (IOC) LEFT DIABETIC;  Surgeon: Galen Manila, MD;  Location: [facility];  Service: Ophthalmology;  Laterality: Left;  0:31 16.4% 5.20   CERVICAL FUSION     COLONOSCOPY WITH PROPOFOL N/A 12/22/2018   Procedure: COLONOSCOPY WITH PROPOFOL;  Surgeon: Sherrilyn Rist, MD;  Location: [facility];  Service:  Gastroenterology;  Laterality: N/A;   KIDNEY STONE SURGERY  03/07/2017   laser blast   LUMBAR LAMINECTOMY/DECOMPRESSION MICRODISCECTOMY N/A 09/23/2017   Procedure: Lumbar Four-Five Redo Discectomy for epidural hematoma.;  Surgeon: Barnett Abu, MD;  Location: [facility];  Service: Neurosurgery;  Laterality: N/A;   TONSILLECTOMY      Current Outpatient Medications  Medication Sig Dispense Refill   Azelastine HCl 137 MCG/SPRAY SOLN PLACE 2 SPRAYS INTO BOTH NOSTRILS 2 (TWO) TIMES DAILY. USE IN EACH NOSTRIL AS DIRECTED 30 mL 3   clotrimazole (LOTRIMIN) 1 % cream Apply 1 application topically 2 (two) times daily. As needed to  affected area. 30 g 1   Evolocumab (REPATHA SURECLICK) 140 MG/ML SOAJ Inject 140 mg into the skin every 14 (fourteen) days. 6 mL 3   fluticasone (FLONASE) 50 MCG/ACT nasal spray SPRAY 2 SPRAYS INTO EACH NOSTRIL EVERY DAY 48 mL 3   glipiZIDE (GLUCOTROL) 5 MG tablet Take 5 mg by mouth every morning.     ketoconazole (NIZORAL) 2 %  shampoo APPLY TOPICALLY 2 (TWO) TIMES A WEEK. 120 mL 1   ketorolac (ACULAR) 0.5 % ophthalmic solution Place 1 drop into both eyes 2 (two) times daily as needed. 5 mL 1   levocetirizine (XYZAL) 5 MG tablet TAKE 1 TABLET BY MOUTH EVERY DAY IN THE EVENING 90 tablet 3   losartan-hydrochlorothiazide (HYZAAR) 50-12.5 MG tablet Take 1 tablet by mouth daily. 90 tablet 1   Olopatadine HCl 0.2 % SOLN Apply 1 drop to eye daily. 2.5 mL 3   omeprazole (PRILOSEC) 40 MG capsule Take 40 mg by mouth daily.     tamsulosin (FLOMAX) 0.4 MG CAPS capsule Take 0.4 mg by mouth daily.     triamcinolone ointment (KENALOG) 0.1 % APPLY TO AFFECTED AREA TWICE A DAY 80 g 2   No current facility-administered medications for this visit.    Allergies as of 05/19/2023 - Review Complete 05/19/2023  Allergen Reaction Noted   Praluent [alirocumab] Other (See Comments) 02/21/2020    Vitals: BP (!) 142/85 (Cuff Size: Normal)   Pulse 85   Ht 5' 5.5" (1.664 m)   Wt 201 lb 3.2 oz (91.3 kg)   BMI 32.97 kg/m  Last Weight:  Wt Readings from Last 1 Encounters:  05/19/23 201 lb 3.2 oz (91.3 kg)   Last Height:   Ht Readings from Last 1 Encounters:  05/19/23 5' 5.5" (1.664 m)     Physical exam: Exam: Gen: NAD, conversant, well nourised, obese, well groomed                     CV: RRR, no MRG. No Carotid Bruits. No peripheral edema, warm, nontender Eyes: Conjunctivae clear without exudates or hemorrhage  Neuro: Detailed Neurologic Exam  Speech:    Speech is normal; fluent and spontaneous with normal comprehension.  Cognition:    The patient is oriented to person, place, and time;     recent and remote memory intact;     language fluent;     normal attention, concentration,     fund of knowledge Cranial Nerves:    The pupils are equal, round, and reactive to light. The fundi are normal and spontaneous venous pulsations are present. Visual fields are full to finger confrontation. Extraocular movements are intact.  Trigeminal sensation is intact and the muscles of mastication are normal. The face is symmetric. The palate elevates in the midline. Hearing impaired. Voice is normal. Shoulder shrug is normal. The tongue has normal motion  without fasciculations.   Coordination: nml  Gait: nml  Motor Observation:    No asymmetry, no atrophy, and no involuntary movements noted. Tone:    Normal muscle tone.    Posture:    Posture is normal. normal erect    Strength:    Strength is V/V in the upper and lower limbs.      Sensation: intact to LT     Reflex Exam:  DTR's:    Deep tendon reflexes in the upper and lower extremities are symmetrical bilaterally.   Toes:    The toes are downgoing bilaterally.   Clonus:    Clonus is absent.    Assessment/Plan:  68 year old here with morning headaches and hearing changes.   MRI IAC protocol to evaluate for Internal Auditory Canal lesion or schwannoma, also  due to concerning symptoms of morning headaches, positional headaches,hearing loss, to look for space occupying mass, intracranial hypertension , strokes, malignancies, vasculidities, demyelination or other  I recommend sleep evaluation: Patient and wife indecisive about getting a sleep evaluation, but I am concerned about sleep apnea considering his symptoms happened in the morning, when he wakes up he feels foggy, he has dry mouth in the mornings, he can have headaches upon waking in the morning, he is obese, he has a very large neck, he reports having more memory issues although he denies taking naps during the day falling asleep during day.  Still I think it would be prudent if he had a sleep study to rule that out. But as a compromise will order an ONO.  Here with [place] in the past, he has ringing in the ear and hearing loss with headaches left > right.  ENT recommended hearing loss. Discussed hearing loss and dementia and recommended he does get the hearing aids  Orders Placed This Encounter   Procedures   MR BRAIN/IAC W WO CONTRAST   BUN   Creatinine, Serum   No orders of the defined types were placed in this encounter.   Cc: Flinchum, Filomena Jungling, MD  Naomie Dean, MD  [facility] [address] [place], [place] [address]  Phone [phone] Fax [phone]

## 2023-05-20 ENCOUNTER — Encounter: Payer: Self-pay | Admitting: Neurology

## 2023-05-20 LAB — CREATININE, SERUM
Creatinine, Ser: 1.09 mg/dL (ref 0.76–1.27)
eGFR: 74 mL/min/{1.73_m2} (ref >59)

## 2023-05-20 LAB — BUN: BUN: 12 mg/dL (ref 8–27)

## 2023-05-20 NOTE — Telephone Encounter (Signed)
 Hebert Soho; Bertram Savin, RN; [facility], [place]; Jeris Penta, [place]; 1 other This patients HTA is not active and we his BCBS plan is OON.   I sent the order to [facility] instead and updated the patient.

## 2023-05-21 NOTE — Telephone Encounter (Signed)
 Zott, Linnell Fulling, Otilio Jefferson, RN; [place], [place] Got It Thank  You

## 2023-05-23 ENCOUNTER — Telehealth: Payer: Self-pay | Admitting: Neurology

## 2023-05-23 NOTE — Telephone Encounter (Signed)
 HTA no auth required sent to [facility] [phone]

## 2023-06-10 NOTE — Telephone Encounter (Signed)
ONO results received.

## 2023-06-13 ENCOUNTER — Ambulatory Visit
Admission: RE | Admit: 2023-06-13 | Discharge: 2023-06-13 | Disposition: A | Discharge status: Home / Self Care | Source: Ambulatory Visit | Attending: Neurology

## 2023-06-13 DIAGNOSIS — H9313 Tinnitus, bilateral: Secondary | ICD-10-CM

## 2023-06-13 DIAGNOSIS — H9193 Unspecified hearing loss, bilateral: Secondary | ICD-10-CM

## 2023-06-13 DIAGNOSIS — R51 Headache with orthostatic component, not elsewhere classified: Secondary | ICD-10-CM

## 2023-06-13 DIAGNOSIS — R519 Headache, unspecified: Secondary | ICD-10-CM

## 2023-06-13 MED ORDER — GADOPICLENOL 0.5 MMOL/ML IV SOLN
10.0000 mL | Freq: Once | INTRAVENOUS | Status: AC | PRN
  Administered 2023-06-13: 9 mL via INTRAVENOUS

## 2023-06-14 ENCOUNTER — Encounter (INDEPENDENT_AMBULATORY_CARE_PROVIDER_SITE_OTHER): Payer: Self-pay | Admitting: Vascular Surgery

## 2023-06-16 NOTE — Telephone Encounter (Signed)
 Let's add a AAA to his visit, however if we have to change the date due to the study that is fine just let him know

## 2023-06-18 ENCOUNTER — Encounter: Payer: Self-pay | Admitting: Neurology

## 2023-07-07 ENCOUNTER — Telehealth: Payer: Self-pay | Admitting: Neurology

## 2023-07-07 DIAGNOSIS — R519 Headache, unspecified: Secondary | ICD-10-CM

## 2023-07-07 DIAGNOSIS — G4734 Idiopathic sleep related nonobstructive alveolar hypoventilation: Secondary | ICD-10-CM

## 2023-07-07 NOTE — Addendum Note (Signed)
 Addended by: Burns Carwin on: 07/07/2023 05:29 PM   Modules accepted: Orders

## 2023-07-07 NOTE — Telephone Encounter (Signed)
 I called the patient. He did seem too fond of the idea but he is willing to proceed with the sleep consult. I did explain to him that some of his symptoms could be caused by a low oxygen overnight. We are happy to evaluate him and try to help. I placed sleep consult order.

## 2023-07-07 NOTE — Telephone Encounter (Signed)
 His ONO showed that his oxygen levels decreased overnight and per medicare he qualifies for nocturnal oxygen, I would recommend a sleep test to confirm and test him for sleep apnea if he is willig let me know and I can place order and share the ONO with sleep team thank you

## 2023-07-22 ENCOUNTER — Encounter (INDEPENDENT_AMBULATORY_CARE_PROVIDER_SITE_OTHER): Payer: Self-pay

## 2023-07-24 ENCOUNTER — Encounter: Payer: Self-pay | Admitting: Cardiovascular Disease

## 2023-07-24 ENCOUNTER — Other Ambulatory Visit (INDEPENDENT_AMBULATORY_CARE_PROVIDER_SITE_OTHER): Payer: Self-pay | Admitting: Nurse Practitioner

## 2023-07-24 DIAGNOSIS — Z87891 Personal history of nicotine dependence: Secondary | ICD-10-CM

## 2023-07-24 DIAGNOSIS — I6523 Occlusion and stenosis of bilateral carotid arteries: Secondary | ICD-10-CM

## 2023-07-29 ENCOUNTER — Encounter (INDEPENDENT_AMBULATORY_CARE_PROVIDER_SITE_OTHER): Payer: BC Managed Care – PPO

## 2023-07-29 ENCOUNTER — Other Ambulatory Visit (INDEPENDENT_AMBULATORY_CARE_PROVIDER_SITE_OTHER)

## 2023-07-29 ENCOUNTER — Ambulatory Visit (INDEPENDENT_AMBULATORY_CARE_PROVIDER_SITE_OTHER): Payer: BC Managed Care – PPO | Admitting: Vascular Surgery

## 2023-08-06 ENCOUNTER — Ambulatory Visit: Admitting: Neurology

## 2023-08-06 ENCOUNTER — Encounter: Payer: Self-pay | Admitting: Neurology

## 2023-08-06 ENCOUNTER — Telehealth: Payer: Self-pay | Admitting: *Deleted

## 2023-08-06 VITALS — BP 148/88 | HR 68 | Ht 65.5 in | Wt 202.0 lb

## 2023-08-06 DIAGNOSIS — Z820 Family history of epilepsy and other diseases of the nervous system: Secondary | ICD-10-CM

## 2023-08-06 DIAGNOSIS — R0683 Snoring: Secondary | ICD-10-CM

## 2023-08-06 DIAGNOSIS — R519 Headache, unspecified: Secondary | ICD-10-CM

## 2023-08-06 DIAGNOSIS — Z9189 Other specified personal risk factors, not elsewhere classified: Secondary | ICD-10-CM

## 2023-08-06 DIAGNOSIS — G4734 Idiopathic sleep related nonobstructive alveolar hypoventilation: Secondary | ICD-10-CM | POA: Diagnosis not present

## 2023-08-06 DIAGNOSIS — R030 Elevated blood-pressure reading, without diagnosis of hypertension: Secondary | ICD-10-CM

## 2023-08-06 DIAGNOSIS — R351 Nocturia: Secondary | ICD-10-CM

## 2023-08-06 NOTE — Progress Notes (Signed)
 Subjective:    Patient ID: Francis Cross is a 68 y.o. male.  HPI    Francis Fairy, MD, PhD [facility] [address] [address] [place], [place] [address]  Dear Francis Cross,   I saw your patient, Francis Cross, upon your kind request in my sleep clinic today for initial consultation of his sleep disorder, in particular, concern for underlying obstructive sleep apnea.  The patient is accompanied by his wife today.  As you know, Francis Cross is a 68 year old male with an underlying medical history of reflux disease, allergies, arthritis, skin cancer, cataracts, chronic kidney disease, kidney stones, diabetes, hiatal hernia, sciatica, hypertension, hyperlipidemia, and mild obesity, who reports intermittent snoring and history of morning headaches.  He reports a family history of sleep apnea affecting his son who has recently been placed on PAP therapy.  His Epworth sleepiness score is 4 out of 24, fatigue severity score is 13 out of 63.  He had a recent overnight pulse oximetry test.  I reviewed the test results from 06/04/2023, test duration was 8 hours and 12 minutes, average oxygen saturation 94.4%, nadir was 84% with time below or at 88% saturation of 1 minute and 53 seconds.  He has never had a sleep study.  He is reluctant to come in for an overnight sleep study but would be willing to consider a home sleep test.  He lives with his wife.  He is retired from [employer].  He drinks caffeine in the form of coffee, 2 cups/day, no alcohol currently and he is a non-smoker.  He has nocturia about once per average night and has had occasional morning headaches, particularly when it is allergy season.  Bedtime is generally between 10 and 11:30 PM and rise time between 7 and 7:30 AM.  He had a tonsillectomy as a young adult.   I reviewed your office note from 05/19/2023 and phone note from 07/07/2023.  His Past Medical History Is Significant For: Past Medical History:  Diagnosis Date   Acid  reflux    Allergy    Arthritis    hands   Cancer (HCC)    skin cancer- basal cell on nose   Cataract    Chronic kidney disease    Kidney stones   Diabetes mellitus without complication (HCC)    Difficult intubation    Erectile dysfunction    Fatty liver    Gout    Hemochromatosis    Hiatal hernia    History of kidney stones    Hyperlipidemia    Hypertension    Iron overload    Obesity    Sciatica    bilateral, intermittent    His Past Surgical History Is Significant For: Past Surgical History:  Procedure Laterality Date   APPENDECTOMY     BACK SURGERY  09/09/2017   lumbar discectomy    CARDIAC CATHETERIZATION     [facility]   CATARACT EXTRACTION W/PHACO Right 12/15/2018   Procedure: CATARACT EXTRACTION PHACO AND INTRAOCULAR LENS PLACEMENT (IOC) RIGHT DIABETIC 00:35.7  16.3%  5.81;  Surgeon: Clair Crews, MD;  Location: [facility];  Service: Ophthalmology;  Laterality: Right;  diabetic - insulin    CATARACT EXTRACTION W/PHACO Left 01/05/2019   Procedure: CATARACT EXTRACTION PHACO AND INTRAOCULAR LENS PLACEMENT (IOC) LEFT DIABETIC;  Surgeon: Clair Crews, MD;  Location: [facility];  Service: Ophthalmology;  Laterality: Left;  0:31 16.4% 5.20   CERVICAL FUSION     COLONOSCOPY WITH PROPOFOL  N/A 12/22/2018   Procedure:  COLONOSCOPY WITH PROPOFOL ;  Surgeon: Albertina Hugger, MD;  Location: [facility];  Service: Gastroenterology;  Laterality: N/A;   KIDNEY STONE SURGERY  03/07/2017   laser blast   LUMBAR LAMINECTOMY/DECOMPRESSION MICRODISCECTOMY N/A 09/23/2017   Procedure: Lumbar Four-Five Redo Discectomy for epidural hematoma.;  Surgeon: Elna Haggis, MD;  Location: [facility] OR;  Service: Neurosurgery;  Laterality: N/A;   TONSILLECTOMY      His Family History Is Significant For: Family History  Problem Relation Age of Onset   Cancer Mother    Melanoma Mother    Lung cancer Father    Hyperlipidemia Sister    Hypertension Sister    Colon cancer Neg Hx     Stomach cancer Neg Hx    Rectal cancer Neg Hx    Esophageal cancer Neg Hx    Liver cancer Neg Hx     His Social History Is Significant For: Social History   Socioeconomic History   Marital status: Married    Spouse name: Not on file   Number of children: 3   Years of education: Not on file   Highest education level: Not on file  Occupational History   Occupation: retired  Tobacco Use   Smoking status: Former   Smokeless tobacco: Former   Tobacco comments:    socially as teenager  Advertising account planner   Vaping status: Never Used  Substance and Sexual Activity   Alcohol use: Not Currently   Drug use: No   Sexual activity: Yes  Other Topics Concern   Not on file  Social History Narrative   Caffeine coffee 1 cup, occa 1 cup in afternoon   Live with wife and dog (bull dog- Cookie)   Working retired 2014.    Social Drivers of Corporate investment banker Strain: Not on file  Food Insecurity: Not on file  Transportation Needs: Not on file  Physical Activity: Not on file  Stress: Not on file  Social Connections: Unknown (07/15/2021)   [facility]   Social Network    Social Network: Not on file    His Allergies Are:  Allergies  Allergen Reactions   Lisinopril  Cough and Other (See Comments)   Praluent  [Alirocumab ] Other (See Comments)    headaches  :   His Current Medications Are:  Outpatient Encounter Medications as of 08/06/2023  Medication Sig   Azelastine  HCl 137 MCG/SPRAY SOLN PLACE 2 SPRAYS INTO BOTH NOSTRILS 2 (TWO) TIMES DAILY. USE IN EACH NOSTRIL AS DIRECTED   clotrimazole  (LOTRIMIN ) 1 % cream Apply 1 application topically 2 (two) times daily. As needed to  affected area.   Evolocumab  (REPATHA  SURECLICK) 140 MG/ML SOAJ Inject 140 mg into the skin every 14 (fourteen) days.   fluticasone  (FLONASE ) 50 MCG/ACT nasal spray SPRAY 2 SPRAYS INTO EACH NOSTRIL EVERY DAY   glipiZIDE (GLUCOTROL XL) 10 MG 24 hr tablet Take 10 mg by mouth every  morning.   ketoconazole  (NIZORAL ) 2 % shampoo APPLY TOPICALLY 2 (TWO) TIMES A WEEK.   levocetirizine (XYZAL ) 5 MG tablet TAKE 1 TABLET BY MOUTH EVERY DAY IN THE EVENING   losartan -hydrochlorothiazide  (HYZAAR) 50-12.5 MG tablet Take 1 tablet by mouth daily.   Olopatadine  HCl 0.2 % SOLN Apply 1 drop to eye daily.   omeprazole  (PRILOSEC) 40 MG capsule Take 40 mg by mouth daily.   tamsulosin  (FLOMAX ) 0.4 MG CAPS capsule Take 0.4 mg by mouth daily.   triamcinolone  ointment (KENALOG ) 0.1 % APPLY TO AFFECTED AREA TWICE A  DAY   ketorolac  (ACULAR ) 0.5 % ophthalmic solution Place 1 drop into both eyes 2 (two) times daily as needed. (Patient not taking: Reported on 08/06/2023)   [DISCONTINUED] glipiZIDE (GLUCOTROL) 5 MG tablet Take 5 mg by mouth every morning.   No facility-administered encounter medications on file as of 08/06/2023.  :   Review of Systems:  Out of a complete 14 point review of systems, all are reviewed and negative with the exception of these symptoms as listed below:  Review of Systems  Neurological:        Patient is here with his wife for sleep consult. Patient reports headaches, low oxygen level on overnight pulse oximetry, some daytime fatigue, some snoring at night. He states he has never had a sleep study. ESS 4 FSS 13    Objective:  Neurological Exam  Physical Exam Physical Examination:   Vitals:   08/06/23 1045  BP: (!) 148/88  Pulse: 68    General Examination: The patient is a very pleasant 68 y.o. male in no acute distress. He appears well-developed and well-nourished and well groomed.   HEENT: Normocephalic, atraumatic, pupils are equal, round and reactive to light, extraocular tracking is good without limitation to gaze excursion or nystagmus noted. Hearing is grossly intact. Face is symmetric with normal facial animation. Speech is clear with no dysarthria noted. There is no hypophonia. There is no lip, neck/head, jaw or voice tremor. Neck is supple with full  range of passive and active motion. There are no carotid bruits on auscultation. Oropharynx exam reveals: Mild mouth dryness, good dental hygiene, mild airway crowding secondary to small airway entry and moderately large uvula, tonsils absent.  Mallampati class II.  Tongue protrudes centrally and palate elevates symmetrically.   Chest: Clear to auscultation without wheezing, rhonchi or crackles noted.  Heart: S1+S2+0, regular and normal without murmurs, rubs or gallops noted.   Abdomen: Soft, non-tender and non-distended.  Extremities: There is no obvious swelling in the distal lower extremities bilaterally.   Skin: Warm and dry without trophic changes noted.   Musculoskeletal: exam reveals no obvious joint deformities.   Neurologically:  Mental status: The patient is awake, alert and oriented in all 4 spheres. His immediate and remote memory, attention, language skills and fund of knowledge are appropriate. There is no evidence of aphasia, agnosia, apraxia or anomia. Speech is clear with normal prosody and enunciation. Thought process is linear. Mood is normal and affect is normal.  Cranial nerves II - XII are as described above under HEENT exam.  Motor exam: Normal bulk, strength and tone is noted. There is no obvious action or resting tremor.  Fine motor skills and coordination: grossly intact.  Cerebellar testing: No dysmetria or intention tremor. There is no truncal or gait ataxia.  Sensory exam: intact to light touch in the upper and lower extremities.  Gait, station and balance: He stands easily. No veering to one side is noted. No leaning to one side is noted. Posture is age-appropriate and stance is narrow based. Gait shows normal stride length and normal pace. No problems turning are noted.   Assessment and Plan:  In summary, Teandre Frerking is a very pleasant 68 y.o.-year old male with an underlying medical history of reflux disease, allergies, arthritis, skin cancer, cataracts,  chronic kidney disease, kidney stones, diabetes, hiatal hernia, sciatica, hypertension, hyperlipidemia, and mild obesity, whose history and physical exam are concerning for sleep disordered breathing, particularly obstructive sleep apnea (OSA). While a laboratory attended sleep study is  typically considered "gold standard" for evaluation of sleep disordered breathing, the patient would prefer a home sleep test at this time.   I had a long chat with the patient and his wife about my findings and the diagnosis of sleep apnea, particularly OSA, its prognosis and treatment options. We talked about medical/conservative treatments, surgical interventions and non-pharmacological approaches for symptom control. I explained, in particular, the risks and ramifications of untreated moderate to severe OSA, especially with respect to developing cardiovascular disease down the road, including congestive heart failure (CHF), difficult to treat hypertension, cardiac arrhythmias (particularly A-fib), neurovascular complications including TIA, stroke and dementia. Even type 2 diabetes has, in part, been linked to untreated OSA. Symptoms of untreated OSA may include (but may not be limited to) daytime sleepiness, nocturia (i.e. frequent nighttime urination), memory problems, mood irritability and suboptimally controlled or worsening mood disorder such as depression and/or anxiety, lack of energy, lack of motivation, physical discomfort, as well as recurrent headaches, especially morning or nocturnal headaches. We talked about the importance of maintaining a healthy lifestyle and striving for healthy weight. I recommended a sleep study at this time. I outlined the differences between a laboratory attended sleep study which is considered more comprehensive and accurate over the option of a home sleep test (HST); the latter may lead to underestimation of sleep disordered breathing in some instances and does not help with diagnosing  upper airway resistance syndrome and is not accurate enough to diagnose primary central sleep apnea typically. I outlined possible surgical and non-surgical treatment options of OSA, including the use of a positive airway pressure (PAP) device (i.e. CPAP, AutoPAP/APAP or BiPAP in certain circumstances), a custom-made dental device (aka oral appliance, which would require a referral to a specialist dentist or orthodontist typically, and is generally speaking not considered for patients with full dentures or edentulous state), upper airway surgical options, such as traditional UPPP (which is not considered a first-line treatment) or the Inspire device (hypoglossal nerve stimulator, which would involve a referral for consultation with an ENT surgeon, after careful selection, following inclusion criteria - also not first-line treatment). I explained the PAP treatment option to the patient in detail, as this is generally considered first-line treatment.  The patient indicated that he would be willing to try PAP therapy, if the need arises. I explained the importance of being compliant with PAP treatment, not only for insurance purposes but primarily to improve patient's symptoms symptoms, and for the patient's long term health benefit, including to reduce His cardiovascular risks longer-term.    We will pick up our discussion about the next steps and treatment options after testing.  We will keep them posted as to the test results by phone call and/or MyChart messaging where possible.  We will plan to follow-up in sleep clinic accordingly as well.  I answered all their questions today and the patient and his wife were in agreement.   I encouraged them to call with any interim questions, concerns, problems or updates or email us  through MyChart.  Generally speaking, sleep test authorizations may take up to 2 weeks, sometimes less, sometimes longer, the patient is encouraged to get in touch with us  if they do not hear  back from the sleep lab staff directly within the next 2 weeks.  Thank you very much for allowing me to participate in the care of this nice patient. If I can be of any further assistance to you please do not hesitate to talk to me.  Sincerely,  Francis Fairy, MD, PhD

## 2023-08-06 NOTE — Telephone Encounter (Signed)
 Received ONO results again from [facility]. Dr Omar Bibber reviewed. Basal SpO2 was 94%, low was 84%, time spent at less than 88% was 1 min 53 seconds. Dr Omar Bibber does recommend sleep study (HST acceptable) to provide more information as this screening tool does show a drop in O2 overnight. I talked to the patient. He is willing to proceed with a sleep study. He will await a call from our sleep lab to schedule after auth obtained. I encouraged to call us  if he does not hear within 2-3 weeks. He thanked me for the call.

## 2023-08-06 NOTE — Patient Instructions (Signed)

## 2023-08-07 ENCOUNTER — Telehealth: Payer: Self-pay | Admitting: Neurology

## 2023-08-07 NOTE — Telephone Encounter (Signed)
 HTA HST pending

## 2023-08-13 NOTE — Telephone Encounter (Signed)
 HST HTA Siegfried Dress: [id number] (exp. 08/07/23 to 11/05/23)

## 2023-08-29 ENCOUNTER — Encounter (INDEPENDENT_AMBULATORY_CARE_PROVIDER_SITE_OTHER): Payer: Self-pay | Admitting: Nurse Practitioner

## 2023-08-29 ENCOUNTER — Ambulatory Visit (INDEPENDENT_AMBULATORY_CARE_PROVIDER_SITE_OTHER)

## 2023-08-29 ENCOUNTER — Ambulatory Visit (INDEPENDENT_AMBULATORY_CARE_PROVIDER_SITE_OTHER): Admitting: Nurse Practitioner

## 2023-08-29 VITALS — BP 157/93 | HR 73 | Ht 65.5 in | Wt 204.2 lb

## 2023-08-29 DIAGNOSIS — Z87891 Personal history of nicotine dependence: Secondary | ICD-10-CM

## 2023-08-29 DIAGNOSIS — E1165 Type 2 diabetes mellitus with hyperglycemia: Secondary | ICD-10-CM | POA: Diagnosis not present

## 2023-08-29 DIAGNOSIS — I10 Essential (primary) hypertension: Secondary | ICD-10-CM

## 2023-08-29 DIAGNOSIS — I6523 Occlusion and stenosis of bilateral carotid arteries: Secondary | ICD-10-CM | POA: Diagnosis not present

## 2023-08-31 ENCOUNTER — Other Ambulatory Visit: Payer: Self-pay | Admitting: Cardiovascular Disease

## 2023-09-01 ENCOUNTER — Encounter (INDEPENDENT_AMBULATORY_CARE_PROVIDER_SITE_OTHER): Payer: Self-pay | Admitting: Nurse Practitioner

## 2023-09-01 NOTE — Progress Notes (Signed)
 Subjective:    Patient ID: Francis Cross, male    DOB: [birth year], 68 y.o.   MRN: [id number] Chief Complaint  Patient presents with   Follow-up    2 year carotid. see jd- added AAA per FB      Patient returns in follow-up of his carotid disease.  He currently is doing well.  He remains on Repatha  and is tolerating this well.  Currently denies any focal neurological symptoms.  He denies any speech or swallowing difficulties, amaurosis fugax or arm or leg weakness.  His carotid duplex today shows very mild disease with near normal thickening/plaque.  His vertebral arteries have antegrade flow with normal flow hemodynamics in the bilateral subclavian arteries.  In addition the patient has had history of smoking and looks concerned about the possibility of an abdominal aortic aneurysm.  Noninvasive study showed no evidence of an abdominal aortic aneurysm.  Largest aortic measurement today was 2.3 cm.     Review of Systems  All other systems reviewed and are negative.      Objective:   Physical Exam Vitals reviewed.  HENT:     Head: Normocephalic.  Neck:     Vascular: No carotid bruit.   Cardiovascular:     Rate and Rhythm: Normal rate.     Pulses:          Radial pulses are 2+ on the right side and 2+ on the left side.  Pulmonary:     Effort: Pulmonary effort is normal.   Skin:    General: Skin is warm and dry.   Neurological:     Mental Status: He is alert and oriented to person, place, and time.   Psychiatric:        Mood and Affect: Mood normal.        Behavior: Behavior normal.        Thought Content: Thought content normal.        Judgment: Judgment normal.     BP (!) 157/93   Pulse 73   Ht 5' 5.5 (1.664 m)   Wt 204 lb 3.2 oz (92.6 kg)   BMI 33.46 kg/m   Past Medical History:  Diagnosis Date   Acid reflux    Allergy    Arthritis    hands   Cancer (HCC)    skin cancer- basal cell on nose   Cataract    Chronic kidney disease    Kidney stones    Diabetes mellitus without complication (HCC)    Difficult intubation    Erectile dysfunction    Fatty liver    Gout    Hemochromatosis    Hiatal hernia    History of kidney stones    Hyperlipidemia    Hypertension    Iron overload    Obesity    Sciatica    bilateral, intermittent    Social History   Socioeconomic History   Marital status: Married    Spouse name: Not on file   Number of children: 3   Years of education: Not on file   Highest education level: Not on file  Occupational History   Occupation: retired  Tobacco Use   Smoking status: Former   Smokeless tobacco: Former   Tobacco comments:    socially as teenager  Advertising account planner   Vaping status: Never Used  Substance and Sexual Activity   Alcohol use: Not Currently   Drug use: No   Sexual activity: Yes  Other Topics Concern   Not on  file  Social History Narrative   Caffeine coffee 1 cup, occa 1 cup in afternoon   Live with wife and dog (bull dog- Cookie)   Working retired 2014.    Social Drivers of Corporate investment banker Strain: Not on file  Food Insecurity: Not on file  Transportation Needs: Not on file  Physical Activity: Not on file  Stress: Not on file  Social Connections: Unknown (07/15/2021)   Received from [facility]   Social Network    Social Network: Not on file  Intimate Partner Violence: Unknown (06/06/2021)   Received from [facility]   HITS    Physically Hurt: Not on file    Insult or Talk Down To: Not on file    Threaten Physical Harm: Not on file    Scream or Curse: Not on file    Past Surgical History:  Procedure Laterality Date   APPENDECTOMY     BACK SURGERY  09/09/2017   lumbar discectomy    CARDIAC CATHETERIZATION     [facility]   CATARACT EXTRACTION W/PHACO Right 12/15/2018   Procedure: CATARACT EXTRACTION PHACO AND INTRAOCULAR LENS PLACEMENT (IOC) RIGHT DIABETIC 00:35.7  16.3%  5.81;  Surgeon: Jaye Fallow, MD;  Location: [facility];  Service: Ophthalmology;   Laterality: Right;  diabetic - insulin    CATARACT EXTRACTION W/PHACO Left 01/05/2019   Procedure: CATARACT EXTRACTION PHACO AND INTRAOCULAR LENS PLACEMENT (IOC) LEFT DIABETIC;  Surgeon: Jaye Fallow, MD;  Location: [facility];  Service: Ophthalmology;  Laterality: Left;  0:31 16.4% 5.20   CERVICAL FUSION     COLONOSCOPY WITH PROPOFOL  N/A 12/22/2018   Procedure: COLONOSCOPY WITH PROPOFOL ;  Surgeon: Legrand Victory LITTIE DOUGLAS, MD;  Location: [facility];  Service: Gastroenterology;  Laterality: N/A;   KIDNEY STONE SURGERY  03/07/2017   laser blast   LUMBAR LAMINECTOMY/DECOMPRESSION MICRODISCECTOMY N/A 09/23/2017   Procedure: Lumbar Four-Five Redo Discectomy for epidural hematoma.;  Surgeon: Colon Victory, MD;  Location: [facility];  Service: Neurosurgery;  Laterality: N/A;   TONSILLECTOMY      Family History  Problem Relation Age of Onset   Cancer Mother    Melanoma Mother    Lung cancer Father    Hyperlipidemia Sister    Hypertension Sister    Colon cancer Neg Hx    Stomach cancer Neg Hx    Rectal cancer Neg Hx    Esophageal cancer Neg Hx    Liver cancer Neg Hx     Allergies  Allergen Reactions   Lisinopril  Cough and Other (See Comments)   Praluent  [Alirocumab ] Other (See Comments)    headaches       Latest Ref Rng & Units 11/10/2019    3:16 AM 11/05/2019   10:57 AM 06/14/2019    8:27 AM  CBC  WBC 4.0 - 10.5 K/uL 14.2  6.9  7.6   Hemoglobin 13.0 - 17.0 g/dL 87.3  84.3  85.0   Hematocrit 39.0 - 52.0 % 38.3  46.3  43.3   Platelets 150 - 400 K/uL 132  194  184       CMP     Component Value Date/Time   NA 137 04/09/2022 1219   K 4.1 04/09/2022 1219   CL 94 (L) 04/09/2022 1219   CO2 24 04/09/2022 1219   GLUCOSE 268 (H) 04/09/2022 1219   GLUCOSE 189 (H) 11/10/2019 0316   BUN 12 05/19/2023 0950   CREATININE 1.09 05/19/2023 0950   CREATININE 0.98 09/21/2015 1052   CALCIUM  10.2 04/09/2022  1219   PROT 7.1 04/09/2022 1219   ALBUMIN  5.0 (H) 04/09/2022 1219   AST 24  04/09/2022 1219   ALT 39 04/09/2022 1219   ALKPHOS 95 04/09/2022 1219   BILITOT 0.6 04/09/2022 1219   GFR 83.58 05/26/2017 0935   EGFR 74 05/19/2023 0950   GFRNONAA 83 02/07/2020 1139   GFRNONAA 84 09/21/2015 1052     No results found.     Assessment & Plan:   1. Bilateral carotid artery stenosis (Primary) Recommend:  Given the patient's asymptomatic subcritical stenosis no further invasive testing or surgery at this time.  Duplex ultrasound shows <40% stenosis bilaterally.  Continue antiplatelet therapy as prescribed Continue management of CAD, HTN and Hyperlipidemia Healthy heart diet,  encouraged exercise at least 4 times per week  Follow up in 24 months with duplex ultrasound and physical exam   2. History of smoking No evidence of abdominal aortic aneurysm.  No additional testing required at this time  3. Essential hypertension Continue antihypertensive medications as already ordered, these medications have been reviewed and there are no changes at this time.  4. Type 2 diabetes mellitus with hyperglycemia, without long-term current use of insulin  (HCC) Continue hypoglycemic medications as already ordered, these medications have been reviewed and there are no changes at this time.  Hgb A1C to be monitored as already arranged by primary service   Current Outpatient Medications on File Prior to Visit  Medication Sig Dispense Refill   fluticasone  (FLONASE ) 50 MCG/ACT nasal spray SPRAY 2 SPRAYS INTO EACH NOSTRIL EVERY DAY 48 mL 3   glipiZIDE (GLUCOTROL XL) 10 MG 24 hr tablet Take 10 mg by mouth every morning.     ketoconazole  (NIZORAL ) 2 % shampoo APPLY TOPICALLY 2 (TWO) TIMES A WEEK. 120 mL 1   levocetirizine (XYZAL ) 5 MG tablet TAKE 1 TABLET BY MOUTH EVERY DAY IN THE EVENING 90 tablet 3   losartan -hydrochlorothiazide  (HYZAAR) 50-12.5 MG tablet Take 1 tablet by mouth daily. 90 tablet 1   Olopatadine  HCl 0.2 % SOLN Apply 1 drop to eye daily. 2.5 mL 3   omeprazole   (PRILOSEC) 40 MG capsule Take 40 mg by mouth daily.     tamsulosin  (FLOMAX ) 0.4 MG CAPS capsule Take 0.4 mg by mouth daily.     triamcinolone  ointment (KENALOG ) 0.1 % APPLY TO AFFECTED AREA TWICE A DAY 80 g 2   Azelastine  HCl 137 MCG/SPRAY SOLN PLACE 2 SPRAYS INTO BOTH NOSTRILS 2 (TWO) TIMES DAILY. USE IN EACH NOSTRIL AS DIRECTED 30 mL 3   clotrimazole  (LOTRIMIN ) 1 % cream Apply 1 application topically 2 (two) times daily. As needed to  affected area. 30 g 1   Evolocumab  (REPATHA  SURECLICK) 140 MG/ML SOAJ Inject 140 mg into the skin every 14 (fourteen) days. 6 mL 3   ketorolac  (ACULAR ) 0.5 % ophthalmic solution Place 1 drop into both eyes 2 (two) times daily as needed. (Patient not taking: Reported on 08/29/2023) 5 mL 1   No current facility-administered medications on file prior to visit.    There are no Patient Instructions on file for this visit. No follow-ups on file.   Ra Pfiester E Tanea Moga, NP

## 2023-09-04 ENCOUNTER — Ambulatory Visit (INDEPENDENT_AMBULATORY_CARE_PROVIDER_SITE_OTHER): Admitting: Neurology

## 2023-09-04 DIAGNOSIS — G4733 Obstructive sleep apnea (adult) (pediatric): Secondary | ICD-10-CM

## 2023-09-04 DIAGNOSIS — R519 Headache, unspecified: Secondary | ICD-10-CM

## 2023-09-04 DIAGNOSIS — R030 Elevated blood-pressure reading, without diagnosis of hypertension: Secondary | ICD-10-CM

## 2023-09-04 DIAGNOSIS — R0683 Snoring: Secondary | ICD-10-CM

## 2023-09-04 DIAGNOSIS — G4734 Idiopathic sleep related nonobstructive alveolar hypoventilation: Secondary | ICD-10-CM

## 2023-09-04 DIAGNOSIS — Z9189 Other specified personal risk factors, not elsewhere classified: Secondary | ICD-10-CM

## 2023-09-04 DIAGNOSIS — Z820 Family history of epilepsy and other diseases of the nervous system: Secondary | ICD-10-CM

## 2023-09-04 DIAGNOSIS — R351 Nocturia: Secondary | ICD-10-CM

## 2023-09-11 NOTE — Progress Notes (Signed)
 see procedure note

## 2023-09-12 ENCOUNTER — Ambulatory Visit: Payer: Self-pay | Admitting: Neurology

## 2023-09-12 DIAGNOSIS — G4733 Obstructive sleep apnea (adult) (pediatric): Secondary | ICD-10-CM

## 2023-09-12 DIAGNOSIS — G4734 Idiopathic sleep related nonobstructive alveolar hypoventilation: Secondary | ICD-10-CM

## 2023-09-12 NOTE — Procedures (Signed)
 [facility]  HOME SLEEP TEST (SANSA) REPORT (Mail-Out Device):   STUDY DATE: 09/08/2023  DOB: 03/24/1955  MRN: [id number]  ORDERING CLINICIAN: True Mar, MD, PhD   REFERRING CLINICIAN: Dr. Ines  CLINICAL INFORMATION/HISTORY: 68 year old male with an underlying medical history of reflux disease, allergies, arthritis, skin cancer, cataracts, chronic kidney disease, kidney stones, diabetes, hiatal hernia, sciatica, hypertension, hyperlipidemia, and mild obesity, who reports intermittent snoring and history of morning headaches.   PATIENT'S LAST REPORTED EPWORTH SLEEPINESS SCORE (ESS): 4/24.  BMI (at the time of sleep clinic visit and/or test date): 33.1 kg/m  FINDINGS:   Study Protocol:    The SANSA single-point-of-skin-contact chest-worn sensor - an FDA cleared and DOT approved type 4 home sleep test device - measures eight physiological channels,  including blood oxygen saturation (measured via PPG [photoplethysmography]), EKG-derived heart rate, respiratory effort, chest movement (measured via accelerometer), snoring, body position, and actigraphy. The device is designed to be worn for up to 10 hours per study.   Sleep Summary:   Total Recording Time (hours, min): 7 hours, 29 min  Total Effective Sleep Time (hours, min):  5 hours, 2 min  Sleep Efficiency (%):    67%   Respiratory Indices:   Calculated sAHI (per hour):  18/hour         Oxygen Saturation Statistics:    Oxygen Saturation (%) Mean: 92.3%   Minimum oxygen saturation (%):                 59.3%   O2 Saturation Range (%): 59.3-97.5%   Time below or at 88% saturation: 28 min   Pulse Rate Statistics:   Pulse Mean (bpm):    59/min    Pulse Range (49- 105/min)   Snoring: Mild to moderate  IMPRESSION/DIAGNOSES:   OSA (obstructive sleep apnea), moderate Nocturnal Hypoxemia  RECOMMENDATIONS:   This home sleep test demonstrates moderate obstructive sleep apnea with a total AHI of  18/hour and O2 nadir of 59.3% with significant time below or at 88% saturation of over 25 minutes for the night, indicating nocturnal hypoxemia.  Mild to moderate snoring was detected. Treatment with a positive airway pressure (PAP) device is recommended. The patient will be advised to proceed with an autoPAP titration/trial at home for now. A full night titration study may be considered to optimize treatment settings, monitor proper oxygen saturations and aid with improvement of tolerance and adherence, if needed down the road. Alternative treatment options may include a dental device through dentistry or orthodontics in selected patients or Inspire (hypoglossal nerve stimulator) in carefully selected patients (meeting inclusion criteria).  Concomitant weight loss is recommended (where clinically appropriate). Please note that untreated obstructive sleep apnea may carry additional perioperative morbidity. Patients with significant obstructive sleep apnea should receive perioperative PAP therapy and the surgeons and particularly the anesthesiologist should be informed of the diagnosis and the severity of the sleep disordered breathing. The patient should be cautioned not to drive, work at heights, or operate dangerous or heavy equipment when tired or sleepy. Review and reiteration of good sleep hygiene measures should be pursued with any patient. Other causes of the patient's symptoms, including circadian rhythm disturbances, an underlying mood disorder, medication effect and/or an underlying medical problem cannot be ruled out based on this test. Clinical correlation is recommended.  The patient and his referring provider will be notified of the test results. The patient will be seen in follow up in sleep clinic at [facility].  I certify that I have  reviewed the raw data recording prior to the issuance of this report in accordance with the standards of the American Academy of Sleep Medicine  (AASM).    INTERPRETING PHYSICIAN:   True Mar, MD, PhD Medical Director, [facility]) Diplomat, ABPN (Neurology and Sleep)   [facility] [address] [place], [place] [address] [phone]

## 2023-09-16 ENCOUNTER — Encounter: Payer: Self-pay | Admitting: Cardiovascular Disease

## 2023-09-16 ENCOUNTER — Ambulatory Visit: Attending: Cardiology | Admitting: Cardiovascular Disease

## 2023-09-16 VITALS — BP 142/82 | HR 67 | Ht 65.5 in | Wt 201.4 lb

## 2023-09-16 DIAGNOSIS — E119 Type 2 diabetes mellitus without complications: Secondary | ICD-10-CM

## 2023-09-16 DIAGNOSIS — I2510 Atherosclerotic heart disease of native coronary artery without angina pectoris: Secondary | ICD-10-CM

## 2023-09-16 DIAGNOSIS — E785 Hyperlipidemia, unspecified: Secondary | ICD-10-CM

## 2023-09-16 DIAGNOSIS — I10 Essential (primary) hypertension: Secondary | ICD-10-CM

## 2023-09-16 DIAGNOSIS — T466X5D Adverse effect of antihyperlipidemic and antiarteriosclerotic drugs, subsequent encounter: Secondary | ICD-10-CM

## 2023-09-16 DIAGNOSIS — G720 Drug-induced myopathy: Secondary | ICD-10-CM

## 2023-09-16 NOTE — Telephone Encounter (Signed)
 Called pt and relayed sleep study results.  Moderate OSA.  Recommend autopap. Send to [facility] they will call with insurance auth and then see you for p/u machine/ how to use, supplies and fit for mask.  See in 2-3 months for insurance compliance appt (time sensitive) and to call office for appt after gets machine.  Pt verbalized understanding.  Appreciated call back.

## 2023-09-16 NOTE — Progress Notes (Signed)
 Cardiology Office Note   Date:  09/16/2023   ID:  Francis Cross, DOB 31-Aug-1955, MRN [id number]  PCP:  Trudy Dorn BRAVO, MD  Cardiologist:   Deatrice Cage, MD   No chief complaint on file.      History of Present Illness: Francis Cross is a 68 y.o. male who is here today for regarding severe hyperlipidemia and mild LAD plaque noted on cardiac CTA done in 2019. He has known history of hypertension, hyperlipidemia and type 2 diabetes.  Previous nuclear stress test in 2015 was normal.  He has known history of intermittent atypical left-sided chest pains thought to be musculoskeletal.  He has known history of severe hyperlipidemia with intolerance to statins and Zetia .    CTA of the coronary arteries in August 2019 showed a calcium  score of 3 with mild soft plaque in the proximal LAD. Echocardiogram in March 2021 showed normal LV systolic function, grade 1 diastolic dysfunction and no significant valvular abnormalities. He had an ABI done in May 2021 which was normal and carotid Doppler showed mild nonobstructive disease. He had back surgery in 2021 .  He had recent carotid Dopplers that showed no significant carotid disease.  Screening for abdominal aortic aneurysm was negative.  He has been doing reasonably well with no chest pain, shortness of breath or palpitations.  He has not been able to afford Repatha  over the last month.  He was getting a grant before then.   Past Medical History:  Diagnosis Date   Acid reflux    Allergy    Arthritis    hands   Cancer (HCC)    skin cancer- basal cell on nose   Cataract    Chronic kidney disease    Kidney stones   Diabetes mellitus without complication (HCC)    Difficult intubation    Erectile dysfunction    Fatty liver    Gout    Hemochromatosis    Hiatal hernia    History of kidney stones    Hyperlipidemia    Hypertension    Iron overload    Obesity    Sciatica    bilateral, intermittent    Past Surgical History:   Procedure Laterality Date   APPENDECTOMY     BACK SURGERY  09/09/2017   lumbar discectomy    CARDIAC CATHETERIZATION     [facility]   CATARACT EXTRACTION W/PHACO Right 12/15/2018   Procedure: CATARACT EXTRACTION PHACO AND INTRAOCULAR LENS PLACEMENT (IOC) RIGHT DIABETIC 00:35.7  16.3%  5.81;  Surgeon: Jaye Fallow, MD;  Location: [facility];  Service: Ophthalmology;  Laterality: Right;  diabetic - insulin    CATARACT EXTRACTION W/PHACO Left 01/05/2019   Procedure: CATARACT EXTRACTION PHACO AND INTRAOCULAR LENS PLACEMENT (IOC) LEFT DIABETIC;  Surgeon: Jaye Fallow, MD;  Location: [facility];  Service: Ophthalmology;  Laterality: Left;  0:31 16.4% 5.20   CERVICAL FUSION     COLONOSCOPY WITH PROPOFOL  N/A 12/22/2018   Procedure: COLONOSCOPY WITH PROPOFOL ;  Surgeon: Legrand Victory LITTIE DOUGLAS, MD;  Location: [facility];  Service: Gastroenterology;  Laterality: N/A;   KIDNEY STONE SURGERY  03/07/2017   laser blast   LUMBAR LAMINECTOMY/DECOMPRESSION MICRODISCECTOMY N/A 09/23/2017   Procedure: Lumbar Four-Five Redo Discectomy for epidural hematoma.;  Surgeon: Colon Victory, MD;  Location: [facility];  Service: Neurosurgery;  Laterality: N/A;   TONSILLECTOMY       Current Outpatient Medications  Medication Sig Dispense Refill   albuterol (VENTOLIN HFA) 108 (90 Base) MCG/ACT inhaler Inhale 1-2 puffs into  the lungs every 6 (six) hours as needed for wheezing or shortness of breath.     Azelastine  HCl 137 MCG/SPRAY SOLN PLACE 2 SPRAYS INTO BOTH NOSTRILS 2 (TWO) TIMES DAILY. USE IN EACH NOSTRIL AS DIRECTED 30 mL 3   clobetasol ointment (TEMOVATE) 0.05 % Apply 1 Application topically 2 (two) times daily as needed.     clotrimazole  (LOTRIMIN ) 1 % cream Apply 1 application topically 2 (two) times daily. As needed to  affected area. 30 g 1   cyclobenzaprine  (FLEXERIL ) 10 MG tablet Take 10 mg by mouth 3 (three) times daily as needed for muscle spasms.     Evolocumab  (REPATHA  SURECLICK) 140 MG/ML  SOAJ Inject 140 mg subcutaneously every 14 days, Need to schedule MD appt for refills 6 mL 0   fluticasone  (FLONASE ) 50 MCG/ACT nasal spray SPRAY 2 SPRAYS INTO EACH NOSTRIL EVERY DAY 48 mL 3   glipiZIDE (GLUCOTROL XL) 10 MG 24 hr tablet Take 10 mg by mouth every morning.     ketoconazole  (NIZORAL ) 2 % shampoo APPLY TOPICALLY 2 (TWO) TIMES A WEEK. 120 mL 1   levocetirizine (XYZAL ) 5 MG tablet TAKE 1 TABLET BY MOUTH EVERY DAY IN THE EVENING 90 tablet 3   losartan -hydrochlorothiazide  (HYZAAR) 50-12.5 MG tablet Take 1 tablet by mouth daily. 90 tablet 1   Olopatadine  HCl 0.2 % SOLN Apply 1 drop to eye daily. 2.5 mL 3   omeprazole  (PRILOSEC) 40 MG capsule Take 40 mg by mouth daily. (Patient taking differently: Take 40 mg by mouth as needed.)     tamsulosin  (FLOMAX ) 0.4 MG CAPS capsule Take 0.4 mg by mouth daily.     triamcinolone  ointment (KENALOG ) 0.1 % APPLY TO AFFECTED AREA TWICE A DAY (Patient taking differently: Apply 1 Application topically as needed.) 80 g 2   ketorolac  (ACULAR ) 0.5 % ophthalmic solution Place 1 drop into both eyes 2 (two) times daily as needed. (Patient not taking: Reported on 09/16/2023) 5 mL 1   No current facility-administered medications for this visit.    Allergies:   Lisinopril  and Praluent  [alirocumab ]    Social History:  The patient  reports that he has quit smoking. He has quit using smokeless tobacco. He reports that he does not currently use alcohol. He reports that he does not use drugs.   Family History:  The patient's family history includes Cancer in his mother; Hyperlipidemia in his sister; Hypertension in his sister; Lung cancer in his father; Melanoma in his mother.    ROS:  Please see the history of present illness.   Otherwise, review of systems are positive for none.   All other systems are reviewed and negative.    PHYSICAL EXAM: VS:  BP (!) 142/82   Pulse 67   Ht 5' 5.5 (1.664 m)   Wt 201 lb 6.4 oz (91.4 kg)   SpO2 98%   BMI 33.00 kg/m  , BMI  Body mass index is 33 kg/m. GEN: Well nourished, well developed, in no acute distress  HEENT: normal  Neck: no JVD, carotid bruits, or masses Cardiac: RRR; no murmurs, rubs, or gallops,no edema  Respiratory:  clear to auscultation bilaterally, normal work of breathing GI: soft, nontender, nondistended, + BS MS: no deformity or atrophy  Skin: warm and dry, no rash Neuro:  Strength and sensation are intact Psych: euthymic mood, full affect   EKG:  EKG is ordered today. EKG showed normal sinus rhythm with nonspecific T wave changes.   Recent Labs: 05/19/2023: BUN 12; Creatinine,  Ser 1.09    Lipid Panel    Component Value Date/Time   CHOL 178 04/09/2022 1219   TRIG 141 04/09/2022 1219   HDL 58 04/09/2022 1219   CHOLHDL 3.1 04/09/2022 1219   CHOLHDL 2.6 11/28/2016 0839   VLDL 29 07/16/2016 1034   LDLCALC 95 04/09/2022 1219   LDLCALC 52 11/28/2016 0839   LDLDIRECT 152.1 10/12/2008 0726      Wt Readings from Last 3 Encounters:  09/16/23 201 lb 6.4 oz (91.4 kg)  08/29/23 204 lb 3.2 oz (92.6 kg)  08/06/23 202 lb (91.6 kg)           No data to display            ASSESSMENT AND PLAN:  1. Mild coronary atherosclerosis: Noted on previous CTA with no obstructive disease.  Continue treatment of risk factors.  Echocardiogram showed normal LV systolic function.  No anginal symptoms at the present time.  2. Severe hyperlipidemia: Previous LDL above 160.  He has known history of intolerance to statins and ezetimibe  due to myalgia.  He has been doing extremely well with Repatha  but his grant ran out 1 month ago.  Will try to assist him with this again. Recheck labs today.  3. Essential hypertension: Blood pressure is well controlled on current medication.  4.  Type 2 diabetes: This has not been optimally controlled as most recent hemoglobin A1c was 8.3.  Check hemoglobin A1c.   Disposition:   FU with me in 12 months.  Signed,  Deatrice Cage, MD  09/16/2023 9:15 AM     [facility]

## 2023-09-16 NOTE — Telephone Encounter (Signed)
-----   Message from True Mar sent at 09/12/2023  2:11 PM EDT ----- Patient referred by Dr. Ines, seen by me on 08/06/2023, patient had a HST on 09/08/2023.    Please call and notify the patient that the recent home sleep test showed obstructive sleep apnea in the moderate range. I recommend treatment in the form of autoPAP, which means, that we don't have  to bring him in for a sleep study with CPAP, but will let him start using a so called autoPAP machine at home, which is a CPAP-like machine with self-adjusting pressures. We will send the order to a  local DME company (of his choice, or as per insurance requirement). The DME representative will fit him with a mask, educate him on how to use the machine, how to put the mask on, etc. I have placed  an order in the chart. Please send the order, talk to patient, send report to referring MD. We will need a FU in sleep clinic for 10 weeks post-PAP set up, please arrange that with me or one of our  NPs. Also reinforce the need for compliance with treatment. Thanks,   True Mar, MD, PhD [facility])    ----- Message ----- From: Mar True, MD Sent: 09/12/2023   2:09 PM EDT To: True Mar, MD

## 2023-09-16 NOTE — Patient Instructions (Signed)
 Medication Instructions:  No changes *If you need a refill on your cardiac medications before your next appointment, please call your pharmacy*  Lab Work: Your provider would like for you to have the following labs today: CBC. CMET, Lipid and A1C  If you have labs (blood work) drawn today and your tests are completely normal, you will receive your results only by: MyChart Message (if you have MyChart) OR A paper copy in the mail If you have any lab test that is abnormal or we need to change your treatment, we will call you to review the results.  Testing/Procedures: None ordered  Follow-Up: At [facility], you and your health needs are our priority.  As part of our continuing mission to provide you with exceptional heart care, our providers are all part of one team.  This team includes your primary Cardiologist (physician) and Advanced Practice Providers or APPs (Physician Assistants and Nurse Practitioners) who all work together to provide you with the care you need, when you need it.  Your next appointment:   12 month(s)  Provider:   Deatrice Cage, MD    We recommend signing up for the patient portal called MyChart.  Sign up information is provided on this After Visit Summary.  MyChart is used to connect with patients for Virtual Visits (Telemedicine).  Patients are able to view lab/test results, encounter notes, upcoming appointments, etc.  Non-urgent messages can be sent to your provider as well.   To learn more about what you can do with MyChart, go to [email or web address].

## 2023-09-17 ENCOUNTER — Ambulatory Visit: Payer: Self-pay | Admitting: Cardiovascular Disease

## 2023-09-17 LAB — LIPID PANEL
Chol/HDL Ratio: 5.2 ratio — ABNORMAL HIGH (ref 0.0–5.0)
Cholesterol, Total: 218 mg/dL — ABNORMAL HIGH (ref 100–199)
HDL: 42 mg/dL (ref >39)
LDL Chol Calc (NIH): 143 mg/dL — ABNORMAL HIGH (ref 0–99)
Triglycerides: 183 mg/dL — ABNORMAL HIGH (ref 0–149)
VLDL Cholesterol Cal: 33 mg/dL (ref 5–40)

## 2023-09-17 LAB — CBC
Hematocrit: 44.2 % (ref 37.5–51.0)
Hemoglobin: 14.5 g/dL (ref 13.0–17.7)
MCH: 31.3 pg (ref 26.6–33.0)
MCHC: 32.8 g/dL (ref 31.5–35.7)
MCV: 95 fL (ref 79–97)
Platelets: 157 x10E3/uL (ref 150–450)
RBC: 4.64 x10E6/uL (ref 4.14–5.80)
RDW: 13.1 % (ref 11.6–15.4)
WBC: 5.3 x10E3/uL (ref 3.4–10.8)

## 2023-09-17 LAB — HEMOGLOBIN A1C
Est. average glucose Bld gHb Est-mCnc: 217 mg/dL
Hgb A1c MFr Bld: 9.2 % — ABNORMAL HIGH (ref 4.8–5.6)

## 2023-09-17 LAB — COMPREHENSIVE METABOLIC PANEL WITH GFR
ALT: 46 IU/L — ABNORMAL HIGH (ref 0–44)
AST: 27 IU/L (ref 0–40)
Albumin: 4.6 g/dL (ref 3.9–4.9)
Alkaline Phosphatase: 74 IU/L (ref 44–121)
BUN/Creatinine Ratio: 19 (ref 10–24)
BUN: 16 mg/dL (ref 8–27)
Bilirubin Total: 0.4 mg/dL (ref 0.0–1.2)
CO2: 19 mmol/L — ABNORMAL LOW (ref 20–29)
Calcium: 9.7 mg/dL (ref 8.6–10.2)
Chloride: 102 mmol/L (ref 96–106)
Creatinine, Ser: 0.86 mg/dL (ref 0.76–1.27)
Globulin, Total: 1.9 g/dL (ref 1.5–4.5)
Glucose: 228 mg/dL — ABNORMAL HIGH (ref 70–99)
Potassium: 4.4 mmol/L (ref 3.5–5.2)
Sodium: 139 mmol/L (ref 134–144)
Total Protein: 6.5 g/dL (ref 6.0–8.5)
eGFR: 95 mL/min/1.73 (ref >59)

## 2023-09-18 ENCOUNTER — Telehealth: Payer: Self-pay | Admitting: *Deleted

## 2023-09-18 ENCOUNTER — Other Ambulatory Visit (HOSPITAL_COMMUNITY): Payer: Self-pay

## 2023-09-18 ENCOUNTER — Telehealth: Payer: Self-pay

## 2023-09-18 NOTE — Telephone Encounter (Signed)
 RE: new autopap user Received: 2 days ago Zott, Glade Salt, Nena RAMAN, RN; [place], [place]; Darrel Boyer Got it Thank You       Previous Messages    ----- Message ----- From: Salt Nena RAMAN, RN Sent: 09/16/2023   2:05 PM EDT To: Madelin Donnice Boyer Darrel; Glade Zott Subject: new autopap user                              New order in epic  New autopap user  Francis Cross, 68 y.o., [birth year] MRN: [id number] Phone: [phone] Francis Cross)   Thanks , Labette RN

## 2023-09-18 NOTE — Telephone Encounter (Signed)
 Patient was seen in the office on 09/16/23. He stated that his grant ran out one month ago for Repatha . He was inquiring if he could get more assistance. Message has been routed to the assistance pool.

## 2023-09-18 NOTE — Telephone Encounter (Signed)
 Patient Advocate Encounter   The patient was approved for a [employer] grant that will help cover the cost of REPATHA  Total amount awarded, $2,500.  Effective: 08/19/23 - 08/17/24   APW:[id number] ERW:[id number] Hmnle:[id number] [id number]   Pharmacy provided with approval and processing information. Patient informed via RHONA Ileana Lehmann, CPhT  Pharmacy Patient Advocate Specialist  Direct Number: [phone] Fax: [phone]

## 2023-09-18 NOTE — Telephone Encounter (Signed)
 Grant reenrolled. Faxed info to pharmacy. Pt informed via mychart.

## 2023-11-16 ENCOUNTER — Other Ambulatory Visit: Payer: Self-pay | Admitting: Cardiovascular Disease

## 2023-11-17 MED ORDER — REPATHA SURECLICK 140 MG/ML ~~LOC~~ SOAJ: SUBCUTANEOUS | 3 refills | Status: AC

## 2023-11-17 NOTE — Addendum Note (Signed)
 Addended by: Halley Shepheard L on: 11/17/2023 12:12 PM   Modules accepted: Orders

## 2024-01-27 ENCOUNTER — Ambulatory Visit: Admitting: Neurology

## 2024-01-27 ENCOUNTER — Encounter: Payer: Self-pay | Admitting: Neurology

## 2024-01-27 VITALS — BP 147/86 | HR 65 | Ht 65.0 in | Wt 201.8 lb

## 2024-01-27 DIAGNOSIS — G4733 Obstructive sleep apnea (adult) (pediatric): Secondary | ICD-10-CM | POA: Diagnosis not present

## 2024-01-27 DIAGNOSIS — Z789 Other specified health status: Secondary | ICD-10-CM | POA: Diagnosis not present

## 2024-01-27 NOTE — Progress Notes (Signed)
 Subjective:    Patient ID: Francis Cross is a 68 y.o. male.  HPI:     Interim history:   Francis Cross is a 68 year old male with an underlying medical history of reflux disease, allergies, arthritis, skin cancer, cataracts, chronic kidney disease, kidney stones, diabetes, hiatal hernia, sciatica, hypertension, hyperlipidemia, and mild obesity, who presents for follow-up consultation of his obstructive sleep apnea after interim testing and starting home AutoPap therapy.  The patient is unaccompanied today.  I first met him at the request of Dr. Ines on 08/06/2023, at which time he reported snoring and morning headaches.  He was advised to proceed with a sleep study.  He had a home sleep test through our office on 09/08/2023 which showed moderate obstructive sleep apnea with a total AHI of 18/hour and O2 nadir of 59.3% with significant time below or at 88% saturation of over 25 minutes for the night, indicating nocturnal hypoxemia.  Mild to moderate snoring was detected.  He was advised to proceed with home AutoPap therapy.  His set up date was 11/10/23.  His DME company is [employer].  Today, 01/27/2024: I reviewed his AutoPap compliance data from 12/28/2023 through 01/26/2024, which is a total of 30 days, during which time he used his machine every night with percent use days greater than 4 hours at 70%, indicating adequate compliance with an average usage of 5 hours and 12 minutes, residual AHI mildly elevated at 10.1/h, 95th percentile pressure at 11.8 cm with a range of 6 to 12 cm with EPR of 1, leak on the high side with the 95th percentile at 66 L/min.  He reports having difficulty adjusting to his AutoPap.  He wakes up with severe dry mouth.  He is using a fullface mask.  He had initially noted some improvement in his daytime somnolence.  He reports that when he first got the machine he could not use it for the first 3 weeks because he had COVID.  He is motivated to continue with treatment.  He has not made  an appointment with his DME provider regarding mask fit and issues with air leakage.  The patient's allergies, current medications, family history, past medical history, past social history, past surgical history and problem list were reviewed and updated as appropriate.   Previously:  08/06/2023: (He) reports intermittent snoring and history of morning headaches.  He reports a family history of sleep apnea affecting his son who has recently been placed on PAP therapy.  His Epworth sleepiness score is 4 out of 24, fatigue severity score is 13 out of 63.  He had a recent overnight pulse oximetry test.  I reviewed the test results from 06/04/2023, test duration was 8 hours and 12 minutes, average oxygen saturation 94.4%, nadir was 84% with time below or at 88% saturation of 1 minute and 53 seconds.  He has never had a sleep study.  He is reluctant to come in for an overnight sleep study but would be willing to consider a home sleep test.  He lives with his wife.  He is retired from [employer].  He drinks caffeine in the form of coffee, 2 cups/day, no alcohol currently and he is a non-smoker.  He has nocturia about once per average night and has had occasional morning headaches, particularly when it is allergy season.  Bedtime is generally between 10 and 11:30 PM and rise time between 7 and 7:30 AM.  He had a tonsillectomy as a young adult.   I reviewed your  office note from 05/19/2023 and phone note from 07/07/2023.   05/19/2023 (Dr. Ines): <<Francis Cross is a 68 y.o. male here as requested by Jaime Rosaline RAMAN, F* for headaches and hearing loss. has Hypercholesteremia; GOUT, UNSPECIFIED; Disorder of iron metabolism; Obesity; Essential hypertension; Diaphragmatic hernia; ERECTILE DYSFUNCTION, ORGANIC; Pain in the chest; Testosterone  deficiency; Hx of carpal tunnel repair; Herniated nucleus pulposus, L4-5 left; Difficult intubation; Special screening for malignant neoplasms, colon; Basal cell carcinoma of neck; Basal  cell carcinoma of temple region; Cough; Degeneration of lumbar intervertebral disc; Paresthesia; Prostatitis; Pain in limb; Headache; Carotid stenosis; Spondylolisthesis; Encounter to establish care; Atopic dermatitis; Non-traumatic mid back pain; Type 2 diabetes mellitus with hyperglycemia, without long-term current use of insulin  (HCC); Mixed hyperlipidemia; Idiopathic peripheral neuropathy; Body mass index (BMI) of 33.0-33.9 in adult; Trigger finger; Allergic conjunctivitis of both eyes; Acute non-recurrent pansinusitis; and Chronic allergic rhinitis on their problem list.   Here with us  in the past, he has ringing gin the ear left > right.  They recommended hearing loss. Discussed hearing loss and dementia and recommended he does get the hearing aids. His hearing is poor , he can read lips, it has gotten bad, wife reports much information. Ongoing for at least 3 years. 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 Wife states he snores He does not want a sleep study I offered an ONO, wife said he woke up with a bad headache the other day and wakes up with headaches, he wakes up with dry mouth, but e denies excessive daytime fatigue. He has morning headaches, dry mouth. Ongoing for years.   Reviewed notes, labs and imaging from outside physicians, which showed:   Medications tried that can be used in headache/migraine management: Tylenol , aspirin, Flexeril , Voltaren  gel, Decadron , gabapentin , ibuprofen, ketorolac  injections, lisinopril , losartan , meloxicam , Robaxin , Reglan , metoprolol , Zofran , prednisone , pregabalin ,topamaz >>  09/22/2019 (Dr. Ines): <<Francis Cross is a 68 y.o. male here as requested by Levora Reyes SAUNDERS, MD for headache.  Patient has a past medical history of diabetes with diabetic neuropathy, hyperlipidemia, sciatica, obesity, hypertension, history of kidney stones, gout, fatty liver, erectile dysfunction, chronic kidney disease, cataract, arthritis, history of smoking, he  was referred by a nurse practitioner in vascular and vein for headaches I do not see where he recently spoke to his primary care about this at last appointment.  He presented to vascular and vein for multiple issues however he did report vision disturbances or TIA-like symptoms and he had an evaluation including carotid Dopplers.  He also reported headache at one point, no significant documentation that I found on his headache history.   Several months ago he started feeling funny in the head would last several hours, dry mouth in the morning, no headache, he has had eye problems, has floaters and cataract surgery end of last year and after that he couldn't see up close without bifocals after eye surgery. He has headaches in the front of the head and wakes up with headaches, once a week-2x a week, he denies napping, maybe foggy headed, lasts for a while, several hours, gets better during the day, 1-2x a night he wakes to urinate. It has gotten better. Nothing makes it worse or better. His eyes are better now and has floaters due to problems in the globe per ophthalmology. He also has retinal thinning and problems there and the flashes and floaters are due to that and all those issues are attributed to problems in the eyes. No fluid  in the ears, some hearing loss but nothing acute, no falls, he reports some memory issues but possibly not moreso than for age. No other focal neurologic deficits, associated symptoms, inciting events or modifiable factors.   Reviewed notes, labs and imaging from outside physicians, which showed: see above   CMP with elevated glucose, BUN 11 and creatinine 0.96, ldl 66, hgba1c 8.8, TSH    >>  His Past Medical History Is Significant For: Past Medical History:  Diagnosis Date   Acid reflux    Allergy    Arthritis    hands   Cancer (HCC)    skin cancer- basal cell on nose   Cataract    Chronic kidney disease    Kidney stones   Diabetes mellitus without complication  (HCC)    Difficult intubation    Erectile dysfunction    Fatty liver    Gout    Hemochromatosis    Hiatal hernia    History of kidney stones    Hyperlipidemia    Hypertension    Iron overload    Obesity    Sciatica    bilateral, intermittent    His Past Surgical History Is Significant For: Past Surgical History:  Procedure Laterality Date   APPENDECTOMY     BACK SURGERY  09/09/2017   lumbar discectomy    CARDIAC CATHETERIZATION     [facility]   CATARACT EXTRACTION W/PHACO Right 12/15/2018   Procedure: CATARACT EXTRACTION PHACO AND INTRAOCULAR LENS PLACEMENT (IOC) RIGHT DIABETIC 00:35.7  16.3%  5.81;  Surgeon: Jaye Fallow, MD;  Location: [facility];  Service: Ophthalmology;  Laterality: Right;  diabetic - insulin    CATARACT EXTRACTION W/PHACO Left 01/05/2019   Procedure: CATARACT EXTRACTION PHACO AND INTRAOCULAR LENS PLACEMENT (IOC) LEFT DIABETIC;  Surgeon: Jaye Fallow, MD;  Location: [facility];  Service: Ophthalmology;  Laterality: Left;  0:31 16.4% 5.20   CERVICAL FUSION     COLONOSCOPY WITH PROPOFOL  N/A 12/22/2018   Procedure: COLONOSCOPY WITH PROPOFOL ;  Surgeon: Legrand Victory LITTIE DOUGLAS, MD;  Location: [facility];  Service: Gastroenterology;  Laterality: N/A;   KIDNEY STONE SURGERY  03/07/2017   laser blast   LUMBAR LAMINECTOMY/DECOMPRESSION MICRODISCECTOMY N/A 09/23/2017   Procedure: Lumbar Four-Five Redo Discectomy for epidural hematoma.;  Surgeon: Colon Victory, MD;  Location: [facility] OR;  Service: Neurosurgery;  Laterality: N/A;   TONSILLECTOMY      His Family History Is Significant For: Family History  Problem Relation Age of Onset   Cancer Mother    Melanoma Mother    Lung cancer Father    Hyperlipidemia Sister    Hypertension Sister    Sleep apnea Son    Colon cancer Neg Hx    Stomach cancer Neg Hx    Rectal cancer Neg Hx    Esophageal cancer Neg Hx    Liver cancer Neg Hx     His Social History Is Significant For: Social History    Socioeconomic History   Marital status: Married    Spouse name: Not on file   Number of children: 3   Years of education: Not on file   Highest education level: Not on file  Occupational History   Occupation: retired  Tobacco Use   Smoking status: Former   Smokeless tobacco: Former   Tobacco comments:    socially as teenager  Advertising Account Planner   Vaping status: Never Used  Substance and Sexual Activity   Alcohol use: Not Currently   Drug use: No   Sexual activity:  Yes  Other Topics Concern   Not on file  Social History Narrative   Caffeine coffee 1 cup, occa 1 cup in afternoon   Live with wife and dog (bull dog- Cookie)   Working retired 2014.    Social Drivers of Corporate Investment Banker Strain: Not on file  Food Insecurity: Not on file  Transportation Needs: Not on file  Physical Activity: Not on file  Stress: Not on file  Social Connections: Unknown (07/15/2021)   Received [facility]   Social Network    Social Network: Not on file    His Allergies Are:  Allergies  Allergen Reactions   Lisinopril  Cough and Other (See Comments)   Praluent  [Alirocumab ] Other (See Comments)    headaches  :   His Current Medications Are:  Outpatient Encounter Medications as of 01/27/2024  Medication Sig   albuterol (VENTOLIN HFA) 108 (90 Base) MCG/ACT inhaler Inhale 1-2 puffs into the lungs every 6 (six) hours as needed for wheezing or shortness of breath.   Azelastine  HCl 137 MCG/SPRAY SOLN PLACE 2 SPRAYS INTO BOTH NOSTRILS 2 (TWO) TIMES DAILY. USE IN EACH NOSTRIL AS DIRECTED   clobetasol ointment (TEMOVATE) 0.05 % Apply 1 Application topically 2 (two) times daily as needed.   clotrimazole  (LOTRIMIN ) 1 % cream Apply 1 application topically 2 (two) times daily. As needed to  affected area.   cyclobenzaprine  (FLEXERIL ) 10 MG tablet Take 10 mg by mouth 3 (three) times daily as needed for muscle spasms.   Evolocumab  (REPATHA  SURECLICK) 140 MG/ML SOAJ Inject 140 mg  subcutaneously every 14 days   fluticasone  (FLONASE ) 50 MCG/ACT nasal spray SPRAY 2 SPRAYS INTO EACH NOSTRIL EVERY DAY   glipiZIDE (GLUCOTROL XL) 10 MG 24 hr tablet Take 10 mg by mouth every morning.   ketoconazole  (NIZORAL ) 2 % shampoo APPLY TOPICALLY 2 (TWO) TIMES A WEEK.   ketorolac  (ACULAR ) 0.5 % ophthalmic solution Place 1 drop into both eyes 2 (two) times daily as needed.   levocetirizine (XYZAL ) 5 MG tablet TAKE 1 TABLET BY MOUTH EVERY DAY IN THE EVENING   losartan -hydrochlorothiazide  (HYZAAR) 50-12.5 MG tablet Take 1 tablet by mouth daily.   Olopatadine  HCl 0.2 % SOLN Apply 1 drop to eye daily.   omeprazole  (PRILOSEC) 40 MG capsule Take 40 mg by mouth daily. (Patient taking differently: Take 40 mg by mouth as needed.)   tamsulosin  (FLOMAX ) 0.4 MG CAPS capsule Take 0.4 mg by mouth daily.   triamcinolone  ointment (KENALOG ) 0.1 % APPLY TO AFFECTED AREA TWICE A DAY (Patient taking differently: Apply 1 Application topically as needed.)   No facility-administered encounter medications on file as of 01/27/2024.  :  Review of Systems:  Out of a complete 14 point review of systems, all are reviewed and negative with the exception of these symptoms as listed below:   Review of Systems  Objective:  Neurological Exam  Physical Exam Physical Examination:   Vitals:   01/27/24 1326  BP: (!) 147/86  Pulse: 65    General Examination: The patient is a very pleasant 68 y.o. male in no acute distress. He appears well-developed and well-nourished and well groomed.   HEENT: Normocephalic, atraumatic, pupils are equal, round and reactive to light, extraocular tracking is good without limitation to gaze excursion or nystagmus noted. Hearing is grossly intact. Face is symmetric with normal facial animation. Speech is clear with no dysarthria noted. There is no hypophonia. There is no lip, neck/head, jaw or voice tremor. Neck is supple  with full range of passive and active motion. There are no  carotid bruits on auscultation. Oropharynx exam reveals: Mild to moderate mouth dryness, good dental hygiene, mild airway crowding.  Tongue protrudes centrally and palate elevates symmetrically.    Chest: Clear to auscultation without wheezing, rhonchi or crackles noted.   Heart: S1+S2+0, regular and normal without murmurs, rubs or gallops noted.    Abdomen: Soft, non-tender and non-distended.   Extremities: There is no obvious swelling in the distal lower extremities bilaterally.    Skin: Warm and dry without trophic changes noted.    Musculoskeletal: exam reveals no obvious joint deformities.    Neurologically:  Mental status: The patient is awake, alert and oriented in all 4 spheres. His immediate and remote memory, attention, language skills and fund of knowledge are appropriate. There is no evidence of aphasia, agnosia, apraxia or anomia. Speech is clear with normal prosody and enunciation. Thought process is linear. Mood is normal and affect is normal.  Cranial nerves II - XII are as described above under HEENT exam.  Motor exam: Normal bulk, strength and tone is noted. There is no obvious action or resting tremor.  Fine motor skills and coordination: grossly intact.  Cerebellar testing: No dysmetria or intention tremor. There is no truncal or gait ataxia.  Sensory exam: intact to light touch in the upper and lower extremities.  Gait, station and balance: He stands easily. No veering to one side is noted. No leaning to one side is noted. Posture is age-appropriate and stance is narrow based. Gait shows normal stride length and normal pace. No problems turning are noted.    Assessment and Plan:  In summary, Francis Cross is a very pleasant 68 year old male with an underlying medical history of reflux disease, allergies, arthritis, skin cancer, cataracts, chronic kidney disease, kidney stones, diabetes, hiatal hernia, sciatica, hypertension, hyperlipidemia, and mild obesity, who presents  for follow-up consultation of his obstructive sleep apnea after interim testing and starting home AutoPap therapy.  He had a home sleep test through our office on 09/08/2023 which showed moderate obstructive sleep apnea with a total AHI of 18/hour and O2 nadir of 59.3% with significant time below or at 88% saturation of over 25 minutes for the night, indicating nocturnal hypoxemia.  Mild to moderate snoring was detected.  He was started on home AutoPap therapy in September 2025 but initially did not use his machine because he had COVID.  He is adequate with his compliance currently but still trying to adjust to it.  Leak is high from the mask, probably in part due to his beard.  He is advised that we have a couple of options at this point.  I would like to have him make an appointment with his DME provider for a mask fit.  We can consider bringing him in for a formal titration study in the near future to optimize treatment settings and mask fit in-lab.  He would like to continue with his AutoPap.  While his AHI is not optimal yet, increasing the pressure would not be very successful because the leak is high and it may make the leak worse.  He already has mouth dryness.  He is advised to call our office or email us  through MyChart in the next 3 months or so so we can review another download and take appropriate steps at the time.  For now, we will plan a follow-up in this clinic in about a year if all goes well.  We  will review his compliance download in about 3 months and I have placed a request for a mask fit appointment in his chart.  He is advised to contact his DME company regarding this.  I answered all his questions today and he was in agreement.   I spent 30 minutes in total face-to-face time and in reviewing records during pre-charting, more than 50% of which was spent in counseling and coordination of care, reviewing test results, reviewing medications and treatment regimen and/or in discussing or reviewing  the diagnosis of OSA, the prognosis and treatment options. Pertinent laboratory and imaging test results that were available during this visit with the patient were reviewed by me and considered in my medical decision making (see chart for details).

## 2024-01-27 NOTE — Patient Instructions (Signed)
 Please continue using your autoPAP regularly. While your insurance requires that you use PAP at least 4 hours each night on 70% of the nights, I recommend, that you not skip any nights and use it throughout the night if you can. Getting used to PAP and staying with the treatment long term does take time and patience and discipline. Untreated obstructive sleep apnea when it is moderate to severe can have an adverse impact on cardiovascular health and raise her risk for heart disease, arrhythmias, hypertension, congestive heart failure, stroke and diabetes. Untreated obstructive sleep apnea causes sleep disruption, nonrestorative sleep, and sleep deprivation. This can have an impact on your day to day functioning and cause daytime sleepiness and impairment of cognitive function, memory loss, mood disturbance, and problems focussing. Using PAP regularly can improve these symptoms.  We may consider bringing you in for a sleep study with CPAP in the near future.   We may increase your pressure setting in the near future.   Please call our office or email us  through My Chart in about 3 months, so I can look at another download from your machine.   I will request a mask fit appointment through [facility].   Follow up in about 12 months with the nurse practitioner.

## 2024-02-24 ENCOUNTER — Encounter: Payer: Self-pay | Admitting: Neurology

## 2025-01-31 ENCOUNTER — Ambulatory Visit: Admitting: Adult Health
